# Patient Record
Sex: Male | Born: 1950 | Race: Black or African American | Hispanic: No | Marital: Single | Smoking: Current every day smoker
Health system: Southern US, Community
[De-identification: ages and names within clinical notes are randomized; demographics above are authoritative.]

## PROBLEM LIST (undated history)

## (undated) DIAGNOSIS — D696 Thrombocytopenia, unspecified: Secondary | ICD-10-CM

## (undated) DIAGNOSIS — I639 Cerebral infarction, unspecified: Secondary | ICD-10-CM

## (undated) DIAGNOSIS — I6521 Occlusion and stenosis of right carotid artery: Secondary | ICD-10-CM

## (undated) DIAGNOSIS — I10 Essential (primary) hypertension: Secondary | ICD-10-CM

## (undated) DIAGNOSIS — C61 Malignant neoplasm of prostate: Secondary | ICD-10-CM

## (undated) DIAGNOSIS — R51 Headache: Secondary | ICD-10-CM

## (undated) DIAGNOSIS — M810 Age-related osteoporosis without current pathological fracture: Secondary | ICD-10-CM

## (undated) DIAGNOSIS — H269 Unspecified cataract: Secondary | ICD-10-CM

## (undated) DIAGNOSIS — N179 Acute kidney failure, unspecified: Secondary | ICD-10-CM

## (undated) DIAGNOSIS — H409 Unspecified glaucoma: Secondary | ICD-10-CM

## (undated) DIAGNOSIS — Z87828 Personal history of other (healed) physical injury and trauma: Secondary | ICD-10-CM

## (undated) HISTORY — DX: Age-related osteoporosis without current pathological fracture: M81.0

## (undated) HISTORY — PX: OTHER SURGICAL HISTORY: SHX169

## (undated) HISTORY — DX: Unspecified cataract: H26.9

## (undated) HISTORY — DX: Malignant neoplasm of prostate: C61

## (undated) HISTORY — DX: Cerebral infarction, unspecified: I63.9

## (undated) HISTORY — DX: Unspecified glaucoma: H40.9

## (undated) HISTORY — DX: Personal history of other (healed) physical injury and trauma: Z87.828

## (undated) HISTORY — DX: Occlusion and stenosis of right carotid artery: I65.21

---

## 1983-03-14 DIAGNOSIS — Z87828 Personal history of other (healed) physical injury and trauma: Secondary | ICD-10-CM

## 1983-03-14 HISTORY — DX: Personal history of other (healed) physical injury and trauma: Z87.828

## 2004-10-31 ENCOUNTER — Emergency Department (HOSPITAL_COMMUNITY): Admission: EM | Admit: 2004-10-31 | Discharge: 2004-10-31 | Payer: Self-pay | Admitting: Emergency Medicine

## 2004-11-02 ENCOUNTER — Emergency Department (HOSPITAL_COMMUNITY): Admission: EM | Admit: 2004-11-02 | Discharge: 2004-11-02 | Payer: Self-pay | Admitting: Emergency Medicine

## 2004-11-05 ENCOUNTER — Emergency Department (HOSPITAL_COMMUNITY): Admission: EM | Admit: 2004-11-05 | Discharge: 2004-11-05 | Payer: Self-pay | Admitting: Family Medicine

## 2011-03-14 HISTORY — PX: CATARACT EXTRACTION W/PHACO: SHX586

## 2013-11-13 ENCOUNTER — Encounter: Payer: Self-pay | Admitting: *Deleted

## 2013-11-17 ENCOUNTER — Emergency Department (HOSPITAL_COMMUNITY): Payer: BC Managed Care – PPO

## 2013-11-17 ENCOUNTER — Observation Stay (HOSPITAL_COMMUNITY): Payer: BC Managed Care – PPO

## 2013-11-17 ENCOUNTER — Encounter (HOSPITAL_COMMUNITY): Payer: Self-pay | Admitting: Emergency Medicine

## 2013-11-17 ENCOUNTER — Inpatient Hospital Stay (HOSPITAL_COMMUNITY)
Admission: EM | Admit: 2013-11-17 | Discharge: 2013-11-19 | DRG: 065 | Disposition: A | Discharge status: Home / Self Care | Payer: BC Managed Care – PPO | Attending: Internal Medicine | Admitting: Internal Medicine

## 2013-11-17 DIAGNOSIS — L299 Pruritus, unspecified: Secondary | ICD-10-CM | POA: Diagnosis present

## 2013-11-17 DIAGNOSIS — G8190 Hemiplegia, unspecified affecting unspecified side: Secondary | ICD-10-CM | POA: Diagnosis present

## 2013-11-17 DIAGNOSIS — I639 Cerebral infarction, unspecified: Secondary | ICD-10-CM | POA: Diagnosis present

## 2013-11-17 DIAGNOSIS — H538 Other visual disturbances: Secondary | ICD-10-CM | POA: Diagnosis present

## 2013-11-17 DIAGNOSIS — G459 Transient cerebral ischemic attack, unspecified: Secondary | ICD-10-CM

## 2013-11-17 DIAGNOSIS — H5710 Ocular pain, unspecified eye: Secondary | ICD-10-CM

## 2013-11-17 DIAGNOSIS — F1490 Cocaine use, unspecified, uncomplicated: Secondary | ICD-10-CM | POA: Diagnosis present

## 2013-11-17 DIAGNOSIS — Z79899 Other long term (current) drug therapy: Secondary | ICD-10-CM

## 2013-11-17 DIAGNOSIS — F1410 Cocaine abuse, uncomplicated: Secondary | ICD-10-CM | POA: Diagnosis present

## 2013-11-17 DIAGNOSIS — R197 Diarrhea, unspecified: Secondary | ICD-10-CM | POA: Diagnosis not present

## 2013-11-17 DIAGNOSIS — I10 Essential (primary) hypertension: Secondary | ICD-10-CM | POA: Diagnosis present

## 2013-11-17 DIAGNOSIS — B369 Superficial mycosis, unspecified: Secondary | ICD-10-CM | POA: Diagnosis present

## 2013-11-17 DIAGNOSIS — N179 Acute kidney failure, unspecified: Secondary | ICD-10-CM | POA: Diagnosis present

## 2013-11-17 DIAGNOSIS — I6330 Cerebral infarction due to thrombosis of unspecified cerebral artery: Secondary | ICD-10-CM | POA: Diagnosis not present

## 2013-11-17 DIAGNOSIS — Z823 Family history of stroke: Secondary | ICD-10-CM

## 2013-11-17 DIAGNOSIS — I6350 Cerebral infarction due to unspecified occlusion or stenosis of unspecified cerebral artery: Secondary | ICD-10-CM | POA: Diagnosis not present

## 2013-11-17 DIAGNOSIS — N289 Disorder of kidney and ureter, unspecified: Secondary | ICD-10-CM | POA: Diagnosis present

## 2013-11-17 DIAGNOSIS — I6529 Occlusion and stenosis of unspecified carotid artery: Secondary | ICD-10-CM | POA: Diagnosis present

## 2013-11-17 DIAGNOSIS — M810 Age-related osteoporosis without current pathological fracture: Secondary | ICD-10-CM | POA: Diagnosis present

## 2013-11-17 DIAGNOSIS — F17200 Nicotine dependence, unspecified, uncomplicated: Secondary | ICD-10-CM | POA: Diagnosis present

## 2013-11-17 DIAGNOSIS — H409 Unspecified glaucoma: Secondary | ICD-10-CM | POA: Diagnosis present

## 2013-11-17 DIAGNOSIS — L989 Disorder of the skin and subcutaneous tissue, unspecified: Secondary | ICD-10-CM

## 2013-11-17 DIAGNOSIS — N390 Urinary tract infection, site not specified: Secondary | ICD-10-CM | POA: Diagnosis present

## 2013-11-17 DIAGNOSIS — I63131 Cerebral infarction due to embolism of right carotid artery: Secondary | ICD-10-CM

## 2013-11-17 HISTORY — DX: Headache: R51

## 2013-11-17 LAB — COMPREHENSIVE METABOLIC PANEL
ALK PHOS: 61 U/L (ref 39–117)
ALT: 13 U/L (ref 0–53)
AST: 21 U/L (ref 0–37)
Albumin: 3.7 g/dL (ref 3.5–5.2)
Anion gap: 11 (ref 5–15)
BUN: 22 mg/dL (ref 6–23)
CHLORIDE: 99 meq/L (ref 96–112)
CO2: 25 meq/L (ref 19–32)
Calcium: 8.6 mg/dL (ref 8.4–10.5)
Creatinine, Ser: 1.61 mg/dL — ABNORMAL HIGH (ref 0.50–1.35)
GFR, EST AFRICAN AMERICAN: 51 mL/min — AB (ref >90)
GFR, EST NON AFRICAN AMERICAN: 44 mL/min — AB (ref >90)
GLUCOSE: 117 mg/dL — AB (ref 70–99)
POTASSIUM: 4 meq/L (ref 3.7–5.3)
Sodium: 135 mEq/L — ABNORMAL LOW (ref 137–147)
Total Bilirubin: 0.8 mg/dL (ref 0.3–1.2)
Total Protein: 6.8 g/dL (ref 6.0–8.3)

## 2013-11-17 LAB — CBC
HEMATOCRIT: 39.8 % (ref 39.0–52.0)
HEMOGLOBIN: 13.4 g/dL (ref 13.0–17.0)
MCH: 31.8 pg (ref 26.0–34.0)
MCHC: 33.7 g/dL (ref 30.0–36.0)
MCV: 94.5 fL (ref 78.0–100.0)
Platelets: 127 10*3/uL — ABNORMAL LOW (ref 150–400)
RBC: 4.21 MIL/uL — AB (ref 4.22–5.81)
RDW: 12.7 % (ref 11.5–15.5)
WBC: 7.8 10*3/uL (ref 4.0–10.5)

## 2013-11-17 LAB — URINALYSIS, ROUTINE W REFLEX MICROSCOPIC
BILIRUBIN URINE: NEGATIVE
Glucose, UA: NEGATIVE mg/dL
Ketones, ur: NEGATIVE mg/dL
Leukocytes, UA: NEGATIVE
Nitrite: NEGATIVE
Protein, ur: 100 mg/dL — AB
Specific Gravity, Urine: 1.026 (ref 1.005–1.030)
Urobilinogen, UA: 0.2 mg/dL (ref 0.0–1.0)
pH: 5.5 (ref 5.0–8.0)

## 2013-11-17 LAB — URINE MICROSCOPIC-ADD ON

## 2013-11-17 LAB — RAPID URINE DRUG SCREEN, HOSP PERFORMED
Amphetamines: NOT DETECTED
Barbiturates: NOT DETECTED
Benzodiazepines: NOT DETECTED
COCAINE: POSITIVE — AB
Opiates: NOT DETECTED
TETRAHYDROCANNABINOL: NOT DETECTED

## 2013-11-17 LAB — DIFFERENTIAL
Basophils Absolute: 0 10*3/uL (ref 0.0–0.1)
Basophils Relative: 0 % (ref 0–1)
Eosinophils Absolute: 0 10*3/uL (ref 0.0–0.7)
Eosinophils Relative: 0 % (ref 0–5)
LYMPHS ABS: 0.6 10*3/uL — AB (ref 0.7–4.0)
Lymphocytes Relative: 8 % — ABNORMAL LOW (ref 12–46)
MONO ABS: 0.7 10*3/uL (ref 0.1–1.0)
MONOS PCT: 9 % (ref 3–12)
NEUTROS ABS: 6.5 10*3/uL (ref 1.7–7.7)
Neutrophils Relative %: 83 % — ABNORMAL HIGH (ref 43–77)

## 2013-11-17 LAB — MRSA PCR SCREENING: MRSA by PCR: NEGATIVE

## 2013-11-17 LAB — I-STAT TROPONIN, ED: Troponin i, poc: 0.01 ng/mL (ref 0.00–0.08)

## 2013-11-17 LAB — PROTIME-INR
INR: 1.17 (ref 0.00–1.49)
Prothrombin Time: 14.9 seconds (ref 11.6–15.2)

## 2013-11-17 LAB — TROPONIN I

## 2013-11-17 LAB — APTT: aPTT: 30 seconds (ref 24–37)

## 2013-11-17 LAB — ETHANOL: Alcohol, Ethyl (B): <11 mg/dL (ref 0–11)

## 2013-11-17 MED ORDER — HYPROMELLOSE (GONIOSCOPIC) 2.5 % OP SOLN: 1.0000 [drp] | Freq: Three times a day (TID) | OPHTHALMIC | Status: DC | PRN

## 2013-11-17 MED ORDER — ASPIRIN 81 MG PO CHEW
324.0000 mg | CHEWABLE_TABLET | Freq: Once | ORAL | Status: AC
  Administered 2013-11-17: 324 mg via ORAL
  Filled 2013-11-17: qty 4

## 2013-11-17 MED ORDER — STROKE: EARLY STAGES OF RECOVERY BOOK
Freq: Once | Status: AC
  Administered 2013-11-17: 18:00:00
  Filled 2013-11-17: qty 1

## 2013-11-17 MED ORDER — TERBINAFINE HCL 1 % EX CREA
TOPICAL_CREAM | Freq: Two times a day (BID) | CUTANEOUS | Status: DC
  Administered 2013-11-17: 22:00:00 via TOPICAL
  Administered 2013-11-18 (×2): 1 via TOPICAL
  Administered 2013-11-19: 12:00:00 via TOPICAL
  Filled 2013-11-17: qty 12

## 2013-11-17 MED ORDER — SODIUM CHLORIDE 0.9 % IV SOLN
INTRAVENOUS | Status: DC
  Administered 2013-11-17: 17:00:00 via INTRAVENOUS

## 2013-11-17 MED ORDER — HEPARIN SODIUM (PORCINE) 5000 UNIT/ML IJ SOLN
5000.0000 [IU] | Freq: Three times a day (TID) | INTRAMUSCULAR | Status: DC
  Administered 2013-11-17 – 2013-11-19 (×6): 5000 [IU] via SUBCUTANEOUS
  Filled 2013-11-17 (×6): qty 1

## 2013-11-17 MED ORDER — ASPIRIN 81 MG PO CHEW
324.0000 mg | CHEWABLE_TABLET | Freq: Every day | ORAL | Status: DC
  Administered 2013-11-18 – 2013-11-19 (×2): 324 mg via ORAL
  Filled 2013-11-17 (×3): qty 4

## 2013-11-17 MED ORDER — SENNOSIDES-DOCUSATE SODIUM 8.6-50 MG PO TABS
1.0000 | ORAL_TABLET | Freq: Every evening | ORAL | Status: DC | PRN
  Filled 2013-11-17: qty 1

## 2013-11-17 MED ORDER — POLYVINYL ALCOHOL 1.4 % OP SOLN
1.0000 [drp] | Freq: Three times a day (TID) | OPHTHALMIC | Status: DC | PRN
  Filled 2013-11-17: qty 15

## 2013-11-17 MED ORDER — ACETAMINOPHEN 325 MG PO TABS
650.0000 mg | ORAL_TABLET | Freq: Four times a day (QID) | ORAL | Status: DC | PRN
  Administered 2013-11-17: 650 mg via ORAL
  Filled 2013-11-17: qty 2

## 2013-11-17 NOTE — Progress Notes (Signed)
Pt arrived to the unit, welcomed and oriented.  Assessment complete, VS taken.  Telemetry monitor applied.  Will continue to monitor. Cori Razor, RN 11/17/2013

## 2013-11-17 NOTE — ED Notes (Signed)
Pt initial sx started at 0930 and resolved around 1000

## 2013-11-17 NOTE — Discharge Summary (Signed)
Name: Oscar Greene MRN: [id number] DOB: 1950/07/22 63 y.o. PCP: Oscar Sheldon, PA-C  Date of Admission: 11/17/2013 10:26 AM Date of Discharge: 11/17/2013 Attending Physician: Oscar Contes, MD  Discharge Diagnosis: Active Problems:   Cerebral thrombosis with cerebral infarction   Renal insufficiency   Cocaine use   Hypertension   Glaucoma   Fungal skin infection  Discharge Medications:   Medication List         aspirin EC 81 MG tablet  Take 1 tablet (81 mg total) by mouth daily.     clopidogrel 75 MG tablet  Commonly known as:  PLAVIX  Take 1 tablet (75 mg total) by mouth daily.     hydroxypropyl methylcellulose / hypromellose 2.5 % ophthalmic solution  Commonly known as:  ISOPTO TEARS / GONIOVISC  Place 1 drop into both eyes 3 (three) times daily as needed for dry eyes.     naproxen sodium 220 MG tablet  Commonly known as:  ANAPROX  Take 440 mg by mouth 2 (two) times daily as needed (for pain).     nystatin cream  Commonly known as:  MYCOSTATIN  Apply 1 application topically 2 (two) times daily.        Disposition and follow-up:   Oscar Greene was discharged from [facility] in Stable condition.  At the hospital follow up visit please address:  1.  Oscar Greene was found to have multiple foci of punctate acute infarction in the right frontal lobe along with right ICA occlusion. His risk factors for stroke include smoking, family history, poor diet and cocaine abuse. We have started to counsel him on changing the modifiable risk factors. Please continue this teaching/counseling.  He was also started on daily aspirin 81mg  and plavix 75mg ; neurology recommended this regimen given his finding of total occlusion of right ICA (he was not a candidate for carotid endarterectomy or stenting).  Finally, the patient had diarrhea that started before his CVA and lasted throughout his hospitalization; his c. Diff was negative, but stool pathogen is  pending. We will call to give him the results.  2.  Labs / imaging needed at time of follow-up: none  3.  Pending labs/ test needing follow-up: stool pathogen  Follow-up Appointments: Oscar Greene at the [facility] [address] [place], [place], [place] [address], [phone] on November 5th, 8:45  Discharge Instructions: Please start taking one aspirin (81 mg) and one plavix (75 mg) every day for stroke prevention. Follow the guidelines we discussed to improve your diet, stop smoking and stop using cocaine. All of these things will help to reduce the risk of another, more debilitating stroke.  You may use the cream on your leg rash twice a day.   Stimulant Use Disorder-Cocaine Cocaine is one of a group of powerful drugs called stimulants. Cocaine has medical uses for stopping nosebleeds and for pain control before minor nose or dental surgery. However, cocaine is misused because of the effects that it produces. These effects include:   A feeling of extreme pleasure.  Alertness.  High energy. Common street names for cocaine include coke, crack, blow, snow, and nose candy. Cocaine is snorted, dissolved in water and injected, or smoked.  Stimulants are addictive because they activate regions of the brain that produce both the pleasurable sensation of "reward" and psychological dependence. Together, these actions account for loss of control and the rapid development of drug dependence. This means you become ill without the drug (withdrawal)  and need to keep using it to function.  Stimulant use disorder is use of stimulants that disrupts your daily life. It disrupts relationships with family and friends and how you do your job. Cocaine increases your blood pressure and heart rate. It can cause a heart attack or stroke. Cocaine can also cause death from irregular heart rate or seizures. SYMPTOMS Symptoms of stimulant use disorder with cocaine include:  Use of cocaine in larger amounts  or over a longer period of time than intended.  Unsuccessful attempts to cut down or control cocaine use.  A lot of time spent obtaining, using, or recovering from the effects of cocaine.  A strong desire or urge to use cocaine (craving).  Continued use of cocaine in spite of major problems at work, school, or home because of use.  Continued use of cocaine in spite of relationship problems because of use.  Giving up or cutting down on important life activities because of cocaine use.  Use of cocaine over and over in situations when it is physically hazardous, such as driving a car.  Continued use of cocaine in spite of a physical problem that is likely related to use. Physical problems can include:  Malnutrition.  Nosebleeds.  Chest pain.  High blood pressure.  A hole that develops between the part of your nose that separates your nostrils (perforated nasal septum).  Lung and kidney damage.  Continued use of cocaine in spite of a mental problem that is likely related to use. Mental problems can include:  Schizophrenia-like symptoms.  Depression.  Bipolar mood swings.  Anxiety.  Sleep problems.  Need to use more and more cocaine to get the same effect, or lessened effect over time with use of the same amount of cocaine (tolerance).  Having withdrawal symptoms when cocaine use is stopped, or using cocaine to reduce or avoid withdrawal symptoms. Withdrawal symptoms include:  Depressed or irritable mood.  Low energy or restlessness.  Bad dreams.  Poor or excessive sleep.  Increased appetite. DIAGNOSIS Stimulant use disorder is diagnosed by your health care provider. You may be asked questions about your cocaine use and how it affects your life. A physical exam may be done. A drug screen may be ordered. You may be referred to a mental health professional. The diagnosis of stimulant use disorder requires at least two symptoms within 12 months. The type of stimulant  use disorder depends on the number of signs and symptoms you have. The type may be:  Mild. Two or three signs and symptoms.  Moderate. Four or five signs and symptoms.  Severe. Six or more signs and symptoms. TREATMENT Treatment for stimulant use disorder is usually provided by mental health professionals with training in substance use disorders. The following options are available:  Counseling or talk therapy. Talk therapy addresses the reasons you use cocaine and ways to keep you from using again. Goals of talk therapy include:  Identifying and avoiding triggers for use.  Handling cravings.  Replacing use with healthy activities.  Support groups. Support groups provide emotional support, advice, and guidance.  Medicine. Certain medicines may decrease cocaine cravings or withdrawal symptoms. HOME CARE INSTRUCTIONS  Take medicines only as directed by your health care provider.  Identify the people and activities that trigger your cocaine use and avoid them.  Keep all follow-up visits as directed by your health care provider. SEEK MEDICAL CARE IF:  Your symptoms get worse or you relapse.  You are not able to take medicines as  directed. SEEK IMMEDIATE MEDICAL CARE IF:  You have serious thoughts about hurting yourself or others.  You have a seizure, chest pain, sudden weakness, or loss of speech or vision. [place] on Drug Abuse: motorcyclefax.com  Substance Abuse and Mental Health Services Administration: ktimeonline.com Document Released: 02/25/2000 Document Revised: 07/14/2013 Document Reviewed: 03/12/2013 [facility] Patient Information 2015 [place], [place]. This information is not intended to replace advice given to you by your health care provider. Make sure you discuss any questions you have with your health care provider. Stroke Prevention Some medical conditions and behaviors are associated with an increased chance of having a stroke.  You may prevent a stroke by making healthy choices and managing medical conditions. HOW CAN I REDUCE MY RISK OF HAVING A STROKE?   Stay physically active. Get at least 30 minutes of activity on most or all days.  Do not smoke. It may also be helpful to avoid exposure to secondhand smoke.  Limit alcohol use. Moderate alcohol use is considered to be:  No more than 2 drinks per day for men.  No more than 1 drink per day for nonpregnant women.  Eat healthy foods. This involves:  Eating 5 or more servings of fruits and vegetables a day.  Making dietary changes that address high blood pressure (hypertension), high cholesterol, diabetes, or obesity.  Manage your cholesterol levels.  Making food choices that are high in fiber and low in saturated fat, trans fat, and cholesterol may control cholesterol levels.  Take any prescribed medicines to control cholesterol as directed by your health care provider.  Manage your diabetes.  Controlling your carbohydrate and sugar intake is recommended to manage diabetes.  Take any prescribed medicines to control diabetes as directed by your health care provider.  Control your hypertension.  Making food choices that are low in salt (sodium), saturated fat, trans fat, and cholesterol is recommended to manage hypertension.  Take any prescribed medicines to control hypertension as directed by your health care provider.  Maintain a healthy weight.  Reducing calorie intake and making food choices that are low in sodium, saturated fat, trans fat, and cholesterol are recommended to manage weight.  Stop drug abuse.  Avoid taking birth control pills.  Talk to your health care provider about the risks of taking birth control pills if you are over 8 years old, smoke, get migraines, or have ever had a blood clot.  Get evaluated for sleep disorders (sleep apnea).  Talk to your health care provider about getting a sleep evaluation if you snore a lot or  have excessive sleepiness.  Take medicines only as directed by your health care provider.  For some people, aspirin or blood thinners (anticoagulants) are helpful in reducing the risk of forming abnormal blood clots that can lead to stroke. If you have the irregular heart rhythm of atrial fibrillation, you should be on a blood thinner unless there is a good reason you cannot take them.  Understand all your medicine instructions.  Make sure that other conditions (such as anemia or atherosclerosis) are addressed. SEEK IMMEDIATE MEDICAL CARE IF:   You have sudden weakness or numbness of the face, arm, or leg, especially on one side of the body.  Your face or eyelid droops to one side.  You have sudden confusion.  You have trouble speaking (aphasia) or understanding.  You have sudden trouble seeing in one or both eyes.  You have sudden trouble walking.  You have dizziness.  You have  a loss of balance or coordination.  You have a sudden, severe headache with no known cause.  You have new chest pain or an irregular heartbeat. Any of these symptoms may represent a serious problem that is an emergency. Do not wait to see if the symptoms will go away. Get medical help at once. Call your local emergency services (911 in [place] Do not drive yourself to the hospital. Document Released: 04/06/2004 Document Revised: 07/14/2013 Document Reviewed: 08/30/2012 [facility] Patient Information 2015 [place], [place]. This information is not intended to replace advice given to you by your health care provider. Make sure you discuss any questions you have with your health care provider. Smoking Cessation Quitting smoking is important to your health and has many advantages. However, it is not always easy to quit since nicotine is a very addictive drug. Oftentimes, people try 3 times or more before being able to quit. This document explains the best ways for you to prepare to quit smoking. Quitting takes hard  work and a lot of effort, but you can do it. ADVANTAGES OF QUITTING SMOKING  You will live longer, feel better, and live better.  Your body will feel the impact of quitting smoking almost immediately.  Within 20 minutes, blood pressure decreases. Your pulse returns to its normal level.  After 8 hours, carbon monoxide levels in the blood return to normal. Your oxygen level increases.  After 24 hours, the chance of having a heart attack starts to decrease. Your breath, hair, and body stop smelling like smoke.  After 48 hours, damaged nerve endings begin to recover. Your sense of taste and smell improve.  After 72 hours, the body is virtually free of nicotine. Your bronchial tubes relax and breathing becomes easier.  After 2 to 12 weeks, lungs can hold more air. Exercise becomes easier and circulation improves.  The risk of having a heart attack, stroke, cancer, or lung disease is greatly reduced.  After 1 year, the risk of coronary heart disease is cut in half.  After 5 years, the risk of stroke falls to the same as a nonsmoker.  After 10 years, the risk of lung cancer is cut in half and the risk of other cancers decreases significantly.  After 15 years, the risk of coronary heart disease drops, usually to the level of a nonsmoker.  If you are pregnant, quitting smoking will improve your chances of having a healthy baby.  The people you live with, especially any children, will be healthier.  You will have extra money to spend on things other than cigarettes. QUESTIONS TO THINK ABOUT BEFORE ATTEMPTING TO QUIT You may want to talk about your answers with your health care provider.  Why do you want to quit?  If you tried to quit in the past, what helped and what did not?  What will be the most difficult situations for you after you quit? How will you plan to handle them?  Who can help you through the tough times? Your family? Friends? A health care provider?  What pleasures do  you get from smoking? What ways can you still get pleasure if you quit? Here are some questions to ask your health care provider:  How can you help me to be successful at quitting?  What medicine do you think would be best for me and how should I take it?  What should I do if I need more help?  What is smoking withdrawal like? How can I get information on withdrawal?  GET READY  Set a quit date.  Change your environment by getting rid of all cigarettes, ashtrays, matches, and lighters in your home, car, or work. Do not let people smoke in your home.  Review your past attempts to quit. Think about what worked and what did not. GET SUPPORT AND ENCOURAGEMENT You have a better chance of being successful if you have help. You can get support in many ways.  Tell your family, friends, and coworkers that you are going to quit and need their support. Ask them not to smoke around you.  Get individual, group, or telephone counseling and support. Programs are available at [employer] and health centers. Call your local health department for information about programs in your area.  Spiritual beliefs and practices may help some smokers quit.  Download a "quit meter" on your computer to keep track of quit statistics, such as how long you have gone without smoking, cigarettes not smoked, and money saved.  Get a self-help book about quitting smoking and staying off tobacco. [place] yourself from urges to smoke. Talk to someone, go for a walk, or occupy your time with a task.  Change your normal routine. Take a different route to work. Drink tea instead of coffee. Eat breakfast in a different place.  Reduce your stress. Take a hot bath, exercise, or read a book.  Plan something enjoyable to do every day. Reward yourself for not smoking.  Explore interactive web-based programs that specialize in helping you quit. GET MEDICINE AND USE IT CORRECTLY Medicines  can help you stop smoking and decrease the urge to smoke. Combining medicine with the above behavioral methods and support can greatly increase your chances of successfully quitting smoking.  Nicotine replacement therapy helps deliver nicotine to your body without the negative effects and risks of smoking. Nicotine replacement therapy includes nicotine gum, lozenges, inhalers, nasal sprays, and skin patches. Some may be available over-the-counter and others require a prescription.  Antidepressant medicine helps people abstain from smoking, but how this works is unknown. This medicine is available by prescription.  Nicotinic receptor partial agonist medicine simulates the effect of nicotine in your brain. This medicine is available by prescription. Ask your health care provider for advice about which medicines to use and how to use them based on your health history. Your health care provider will tell you what side effects to look out for if you choose to be on a medicine or therapy. Carefully read the information on the package. Do not use any other product containing nicotine while using a nicotine replacement product.  RELAPSE OR DIFFICULT SITUATIONS Most relapses occur within the first 3 months after quitting. Do not be discouraged if you start smoking again. Remember, most people try several times before finally quitting. You may have symptoms of withdrawal because your body is used to nicotine. You may crave cigarettes, be irritable, feel very hungry, cough often, get headaches, or have difficulty concentrating. The withdrawal symptoms are only temporary. They are strongest when you first quit, but they will go away within 10-14 days. To reduce the chances of relapse, try to:  Avoid drinking alcohol. Drinking lowers your chances of successfully quitting.  Reduce the amount of caffeine you consume. Once you quit smoking, the amount of caffeine in your body increases and can give you symptoms, such  as a rapid heartbeat, sweating, and anxiety.  Avoid smokers because they can make you want to smoke.  Do not let  weight gain distract you. Many smokers will gain weight when they quit, usually less than 10 pounds. Eat a healthy diet and stay active. You can always lose the weight gained after you quit.  Find ways to improve your mood other than smoking. FOR MORE INFORMATION  www.smokefree.gov  Document Released: 02/21/2001 Document Revised: 07/14/2013 Document Reviewed: 06/08/2011 [facility] Patient Information 2015 [place], [place]. This information is not intended to replace advice given to you by your health care provider. Make sure you discuss any questions you have with your health care provider.   Consultations: Treatment Team:  Md Stroke, MD  Procedures Performed:  Dg Chest 2 View  11/17/2013   CLINICAL DATA:  Weakness. Left-sided paralysis which subsequently resolved. Illness for several days.  EXAM: CHEST  2 VIEW  COMPARISON:  None.  FINDINGS: Cardiothoracic index 52% compatible with mild enlargement. No edema. Mildly tortuous thoracic aorta.  The lungs appear clear.  IMPRESSION: 1. Mildly enlarged cardiopericardial silhouette. Otherwise, no significant abnormalities are observed.   Electronically Signed   By: Sherryl Barters M.D.   On: 11/17/2013 12:24   Ct Head Wo Contrast  11/17/2013   CLINICAL DATA:  Felt weak while working outside. Episode of left-sided paralysis. Symptoms have resolved. 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  EXAM: CT HEAD WITHOUT CONTRAST  TECHNIQUE: Contiguous axial images were obtained from the base of the skull through the vertex without intravenous contrast.  COMPARISON:  None.  FINDINGS: Remote left frontal craniotomy. Underlying encephalomalacia. Findings consistent with patient's remote injury.  No intracranial hemorrhage.  Right caudate head and mid to anterior right centrum semiovale hypodensity have an appearance more suggestive of remote  infarction rather than acute infarct.  No CT evidence of large acute infarct.  Calcification anterior right frontal lobe may reflect result prior trauma or infection. No surrounding mass identified on this unenhanced exam.  No hydrocephalus.  IMPRESSION: Remote left frontal craniotomy. Underlying encephalomalacia. Findings consistent with patient's remote injury.  No intracranial hemorrhage.  Right caudate head and mid to anterior right centrum semiovale hypodensity have an appearance more suggestive of remote infarction rather than acute infarct.  No CT evidence of large acute infarct.  Calcification anterior right frontal lobe may reflect result prior trauma or infection.   Electronically Signed   By: Chauncey Cruel M.D.   On: 11/17/2013 11:56   Mr Brain Wo Contrast  11/17/2013   CLINICAL DATA:  Transient episode of left-sided weakness. Evaluate for stroke.  EXAM: MRI HEAD WITHOUT CONTRAST  MRA HEAD WITHOUT CONTRAST  TECHNIQUE: Multiplanar, multiecho pulse sequences of the brain and surrounding structures were obtained without intravenous contrast. Angiographic images of the head were obtained using MRA technique without contrast.  COMPARISON:  Head CT 11/17/2013  FINDINGS: MRI HEAD FINDINGS  Multiple punctate foci of restricted diffusion are present in the right frontal lobe involving cortex and subcortical white matter in the MCA territory. There is no evidence of intracranial hemorrhage, mass, midline shift, or extra-axial fluid collection. A small calcification is again seen in the right frontal lobe. Sequelae of prior left frontal craniotomy are again identified with underlying left frontal lobe encephalomalacia. Scattered foci of T2 hyperintensity in the subcortical and deep cerebral white matter are nonspecific but compatible with mild chronic small vessel ischemic disease. There is mild generalized cerebral atrophy. An old lacunar infarct is noted in the right caudate.  Orbits are unremarkable. Mild right  maxillary sinus mucosal thickening is noted. Mastoid air cells are clear. Abnormal  appearance of the intracranial right ICA flow void is consistent with proximal occlusion in the neck. Mild upper cervical spondylosis is partially visualized with marrow changes at C4-5 favored to be degenerative.  MRA HEAD FINDINGS  The visualized distal vertebral arteries are patent and codominant. PICA origins are patent. AICA and SCA origins are patent. Basilar artery is patent without stenosis. PCAs are unremarkable. Posterior communicating arteries are not clearly identified.  Lack of flow related enhancement in the imaged, distal cervical right ICA and majority of the intracranial right ICA is consistent with proximal occlusion in the neck. Minimal flow related enhancement in the right supraclinoid ICA may reflect ECA collateral flow via the ophthalmic artery. The right M1 segment is patent without focal stenosis, however there is diminished flow within the M1 segment as well as in MCA branch vessels compared to the left. The number of right MCA distal branch vessels also appears diminished compared to the left. There is minimal flow related enhancement in the right A1 segment, with the right A2 segment presumably receiving the majority of its supply via the anterior communicating artery, although this is not well seen.  Left internal carotid artery is patent from skullbase to carotid terminus without stenosis. Left MCA and left ACA are unremarkable. No intracranial aneurysm is identified.  IMPRESSION: 1. Multiple foci of punctate acute infarction in the right frontal lobe. 2. 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 3. Left frontal lobe encephalomalacia at site of prior craniotomy. 4. 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 Distal reconstitution of the carotid terminus, likely via external carotid flow. Proximal right MCA is patent, although with diminished  flow.   Electronically Signed   By: Logan Bores   On: 11/17/2013 17:04   Mr Jodene Nam Head/brain Wo Cm  11/17/2013   CLINICAL DATA:  Transient episode of left-sided weakness. Evaluate for stroke.  EXAM: MRI HEAD WITHOUT CONTRAST  MRA HEAD WITHOUT CONTRAST  TECHNIQUE: Multiplanar, multiecho pulse sequences of the brain and surrounding structures were obtained without intravenous contrast. Angiographic images of the head were obtained using MRA technique without contrast.  COMPARISON:  Head CT 11/17/2013  FINDINGS: MRI HEAD FINDINGS  Multiple punctate foci of restricted diffusion are present in the right frontal lobe involving cortex and subcortical white matter in the MCA territory. There is no evidence of intracranial hemorrhage, mass, midline shift, or extra-axial fluid collection. A small calcification is again seen in the right frontal lobe. Sequelae of prior left frontal craniotomy are again identified with underlying left frontal lobe encephalomalacia. Scattered foci of T2 hyperintensity in the subcortical and deep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

## 2013-11-17 NOTE — Consult Note (Signed)
Stroke Consult Note     Chief Complaint: left sided weakness and dizziness HPI: Oscar Greene is an 63 y.o. male who was at work in a tobacco field and this morning when at 9 AM he noticed sudden onset of dizziness as well as left-sided weakness. He had trouble moving his left hand and had decreased sensation. His symptoms lasted only about 30 minutes and started recovering at the time he came to the hospital he had completely resolved. He denied any complaint headache, slurred speech, double vision. He has no known prior history of strokes or TIAs. He has not seen a physician for years. He has had glaucoma diagnosed in recent years and has some arthritis for which he takes Motrin twice daily. He has a remote history of a left frontal craniotomy for a blood clot evacuation with no residual neurological deficits or history of seizures  LSN: 9 AM on 11/17/13 tPA Given: No: Complete resolution of deficits  Past Medical History  Diagnosis Date  . Cataract   . Glaucoma   . Osteoporosis     History reviewed. No pertinent past surgical history.  History reviewed. No pertinent family history. Social History:  reports that he has been smoking.  He has never used smokeless tobacco. He reports that he does not use illicit drugs. His alcohol history is not on file.  Allergies: No Known Allergies   (Not in a hospital admission)  ROS: Dizziness, numbness, weakness and joint pain. All other systems negative  Physical Examination: Blood pressure 110/83, pulse 81, temperature 98.3 F (36.8 C), temperature source Oral, resp. rate 19, SpO2 100.00%.  Awake alert. Afebrile. Head is nontraumatic. Neck is supple without bruit. Hearing is normal. Cardiac exam no murmur or gallop. Lungs are clear to auscultation. Distal pulses are well felt. Left temporal skull defect and prior craniotomy site Neurologic Examination:   ;  Awake  Alert oriented x 3. Normal speech and language.eye movements full without  nystagmus.fundi were not visualized. Vision acuity and fields appear normal. Hearing is normal. Palatal movements are normal. Face symmetric. Tongue midline. Normal strength, tone, reflexes and coordination. Normal sensation. Gait deferred. NIHSS 1 missed his age MRS 0 ABCD2 score 4  Results for orders placed during the hospital encounter of 11/17/13 (from the past 48 hour(s))  ETHANOL     Status: None   Collection Time    11/17/13 12:08 PM      Result Value Ref Range   Alcohol, Ethyl (B) <11  0 - 11 mg/dL   Comment:            LOWEST DETECTABLE LIMIT FOR     SERUM ALCOHOL IS 11 mg/dL     FOR MEDICAL PURPOSES ONLY  PROTIME-INR     Status: None   Collection Time    11/17/13 12:08 PM      Result Value Ref Range   Prothrombin Time 14.9  11.6 - 15.2 seconds   INR 1.17  0.00 - 1.49  APTT     Status: None   Collection Time    11/17/13 12:08 PM      Result Value Ref Range   aPTT 30  24 - 37 seconds  CBC     Status: Abnormal   Collection Time    11/17/13 12:08 PM      Result Value Ref Range   WBC 7.8  4.0 - 10.5 K/uL   RBC 4.21 (*) 4.22 - 5.81 MIL/uL   Hemoglobin 13.4  13.0 -  17.0 g/dL   HCT 39.8  39.0 - 52.0 %   MCV 94.5  78.0 - 100.0 fL   MCH 31.8  26.0 - 34.0 pg   MCHC 33.7  30.0 - 36.0 g/dL   RDW 12.7  11.5 - 15.5 %   Platelets 127 (*) 150 - 400 K/uL  DIFFERENTIAL     Status: Abnormal   Collection Time    11/17/13 12:08 PM      Result Value Ref Range   Neutrophils Relative % 83 (*) 43 - 77 %   Neutro Abs 6.5  1.7 - 7.7 K/uL   Lymphocytes Relative 8 (*) 12 - 46 %   Lymphs Abs 0.6 (*) 0.7 - 4.0 K/uL   Monocytes Relative 9  3 - 12 %   Monocytes Absolute 0.7  0.1 - 1.0 K/uL   Eosinophils Relative 0  0 - 5 %   Eosinophils Absolute 0.0  0.0 - 0.7 K/uL   Basophils Relative 0  0 - 1 %   Basophils Absolute 0.0  0.0 - 0.1 K/uL  COMPREHENSIVE METABOLIC PANEL     Status: Abnormal   Collection Time    11/17/13 12:08 PM      Result Value Ref Range   Sodium 135 (*) 137 - 147  mEq/L   Potassium 4.0  3.7 - 5.3 mEq/L   Chloride 99  96 - 112 mEq/L   CO2 25  19 - 32 mEq/L   Glucose, Bld 117 (*) 70 - 99 mg/dL   BUN 22  6 - 23 mg/dL   Creatinine, Ser 1.61 (*) 0.50 - 1.35 mg/dL   Calcium 8.6  8.4 - 10.5 mg/dL   Total Protein 6.8  6.0 - 8.3 g/dL   Albumin 3.7  3.5 - 5.2 g/dL   AST 21  0 - 37 U/L   ALT 13  0 - 53 U/L   Alkaline Phosphatase 61  39 - 117 U/L   Total Bilirubin 0.8  0.3 - 1.2 mg/dL   GFR calc non Af Amer 44 (*) >90 mL/min   GFR calc Af Amer 51 (*) >90 mL/min   Comment: (NOTE)     The eGFR has been calculated using the CKD EPI equation.     This calculation has not been validated in all clinical situations.     eGFR's persistently <90 mL/min signify possible Chronic Kidney     Disease.   Anion gap 11  5 - 15  I-STAT TROPOININ, ED     Status: None   Collection Time    11/17/13 12:15 PM      Result Value Ref Range   Troponin i, poc 0.01  0.00 - 0.08 ng/mL   Comment 3            Comment: Due to the release kinetics of cTnI,     a negative result within the first hours     of the onset of symptoms does not rule out     myocardial infarction with certainty.     If myocardial infarction is still suspected,     repeat the test at appropriate intervals.   Dg Chest 2 View  11/17/2013   CLINICAL DATA:  Weakness. Left-sided paralysis which subsequently resolved. Illness for several days.  EXAM: CHEST  2 VIEW  COMPARISON:  None.  FINDINGS: Cardiothoracic index 52% compatible with mild enlargement. No edema. Mildly tortuous thoracic aorta.  The lungs appear clear.  IMPRESSION: 1. Mildly enlarged cardiopericardial silhouette. Otherwise,  no significant abnormalities are observed.   Electronically Signed   By: Sherryl Barters M.D.   On: 11/17/2013 12:24   Ct Head Wo Contrast  11/17/2013   CLINICAL DATA:  Felt weak while working outside. Episode of left-sided paralysis. Symptoms have resolved. 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  EXAM: CT HEAD  WITHOUT CONTRAST  TECHNIQUE: Contiguous axial images were obtained from the base of the skull through the vertex without intravenous contrast.  COMPARISON:  None.  FINDINGS: Remote left frontal craniotomy. Underlying encephalomalacia. Findings consistent with patient's remote injury.  No intracranial hemorrhage.  Right caudate head and mid to anterior right centrum semiovale hypodensity have an appearance more suggestive of remote infarction rather than acute infarct.  No CT evidence of large acute infarct.  Calcification anterior right frontal lobe may reflect result prior trauma or infection. No surrounding mass identified on this unenhanced exam.  No hydrocephalus.  IMPRESSION: Remote left frontal craniotomy. Underlying encephalomalacia. Findings consistent with patient's remote injury.  No intracranial hemorrhage.  Right caudate head and mid to anterior right centrum semiovale hypodensity have an appearance more suggestive of remote infarction rather than acute infarct.  No CT evidence of large acute infarct.  Calcification anterior right frontal lobe may reflect result prior trauma or infection.   Electronically Signed   By: Chauncey Cruel M.D.   On: 11/17/2013 11:56    Assessment: 63 y.o. male with transient left-sided weakness secondary to right brain TIA etiology undetermined at present but likely due to small vessel disease. Stroke Risk Factors - hypertension and smoking  Plan: 1. HgbA1c, fasting lipid panel 2. MRI, MRA  of the brain without contrast 3. PT consult, OT consult, Speech consult 4. Echocardiogram 5. Carotid dopplers 6. Prophylactic therapy-Antiplatelet med: Aspirin - dose 300 mg versus Ticagrelor in [facility] Trial 7. Risk factor modification. Patient counseled to quit smoking and be compliant with his antiplatelet therapy and blood pressure medicines 8. Telemetry monitoring 9. Frequent neuro checks I had a long discussion with the patient, sister, brother and other family members  regarding his risk for recurrent strokes, need for further evaluation and aggressive risk factor modification to prevent future strokes and TIAs. Patient is at significant risk for worsening of deficits and recurrent strokes. He will be admitted for further stroke risk stratification workup. He may consider possible participation in the [employer] trial if interested.    Antony Contras, MD  11/17/2013, 1:40 PM

## 2013-11-17 NOTE — ED Provider Notes (Signed)
Patient seen/examined in the Emergency Department in conjunction with Midlevel Provider [facility] Patient reports left sided weakness that has resolved.  Denies any toxic exposure at work Exam : awake/alert, no arm/leg drift, no facial droop Plan: will admit for TIA  tPA in stroke considered but not given due to:   Symptoms resolved    EKG Interpretation  Date/Time:  Monday November 17 2013 10:39:29 EDT Ventricular Rate:  91 PR Interval:  147 QRS Duration: 86 QT Interval:  385 QTC Calculation: 474 R Axis:   77 Text Interpretation:  Sinus rhythm Left atrial enlargement Left ventricular hypertrophy ST depression in inferior leads No previous ECGs available Confirmed by Christy Gentles  MD, Evani Shrider ([id number]) on 11/17/2013 10:48:36 AM        Sharyon Cable, MD 11/17/13 1332
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
## 2013-11-17 NOTE — ED Provider Notes (Signed)
CSN: [id number]     Arrival date & time 11/17/13  1026 History   First MD Initiated Contact with Patient 11/17/13 1031     Chief Complaint  Patient presents with  . Stroke Symptoms     (Consider location/radiation/quality/duration/timing/severity/associated sxs/prior Treatment) The history is provided by the patient and medical records.   This is a 63 year old male with past medical history significant for glaucoma, presenting to the ED with strokelike symptoms.  Patient has been feeling generally weak for the past 3 days, attempted to return to work earlier today. 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 On EMS arrival, patient did have noticeable left-sided weakness which has fully resolved at this time. Patient states he feels back to his baseline. He has no prior episodes of the same. He has no personal history of TIA or stroke. No family hx of stroke.  He does note a recent dry cough without fever or chills.   Patient is not currently on any anti-coagulants.  Patient was recently started on new eye drops for his glaucoma, no other medication changes.  Scheduled for Lasik eye surgery on 12/03/13.  Past Medical History  Diagnosis Date  . Cataract   . Glaucoma   . Osteoporosis    History reviewed. No pertinent past surgical history. History reviewed. No pertinent family history. History  Substance Use Topics  . Smoking status: Current Every Day Smoker  . Smokeless tobacco: Never Used  . Alcohol Use: Not on file    Review of Systems  Neurological: Positive for weakness.  All other systems reviewed and are negative.     Allergies  Review of patient's allergies indicates no known allergies.  Home Medications   Prior to Admission medications   Not on File   BP 110/83  Pulse 81  Temp(Src) 98.3 F (36.8 C)  (Oral)  Resp 19  SpO2 100%  Physical Exam  Nursing note and vitals reviewed. Constitutional: He is oriented to person, place, and time. He appears well-developed and well-nourished. No distress.  HENT:  Head: Normocephalic and atraumatic.  Mouth/Throat: Oropharynx is clear and moist.  Eyes: Conjunctivae and EOM are normal. Pupils are equal, round, and reactive to light.  Neck: Normal range of motion. Neck supple.  Cardiovascular: Normal rate, regular rhythm and normal heart sounds.   Pulmonary/Chest: Effort normal and breath sounds normal. No respiratory distress. He has no wheezes.  Abdominal: Soft. Bowel sounds are normal. There is no tenderness. There is no guarding.  Musculoskeletal: Normal range of motion.  Neurological: He is alert and oriented to person, place, and time.  AAOx3, answering questions and following commands appropriately; equal strength UE and LE bilaterally; CN grossly intact; moves all extremities appropriately without ataxia; normal coordination bilaterally; no dysmetria with finger to nose bilaterally; no pronator drift; no focal neuro deficits or facial asymmetry appreciated; gait not tested  Skin: Skin is warm and dry. He is not diaphoretic.  Psychiatric: He has a normal mood and affect.    ED Course  Procedures (including critical care time) Labs Review Labs Reviewed  CBC - Abnormal; Notable for the following:    RBC 4.21 (*)    Platelets 127 (*)    All other components within normal limits  DIFFERENTIAL - Abnormal; Notable for the following:    Neutrophils Relative % 83 (*)  Lymphocytes Relative 8 (*)    Lymphs Abs 0.6 (*)    All other components within normal limits  COMPREHENSIVE METABOLIC PANEL - Abnormal; Notable for the following:    Sodium 135 (*)    Glucose, Bld 117 (*)    Creatinine, Ser 1.61 (*)    GFR calc non Af Amer 44 (*)    GFR calc Af Amer 51 (*)    All other components within normal limits  ETHANOL  PROTIME-INR  APTT  URINE  RAPID DRUG SCREEN (HOSP PERFORMED)  URINALYSIS, ROUTINE W REFLEX MICROSCOPIC  I-STAT TROPOININ, ED    Imaging Review Dg Chest 2 View  11/17/2013   CLINICAL DATA:  Weakness. Left-sided paralysis which subsequently resolved. Illness for several days.  EXAM: CHEST  2 VIEW  COMPARISON:  None.  FINDINGS: Cardiothoracic index 52% compatible with mild enlargement. No edema. Mildly tortuous thoracic aorta.  The lungs appear clear.  IMPRESSION: 1. Mildly enlarged cardiopericardial silhouette. Otherwise, no significant abnormalities are observed.   Electronically Signed   By: Sherryl Barters M.D.   On: 11/17/2013 12:24   Ct Head Wo Contrast  11/17/2013   CLINICAL DATA:  Felt weak while working outside. Episode of left-sided paralysis. Symptoms have resolved. 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  EXAM: CT HEAD WITHOUT CONTRAST  TECHNIQUE: Contiguous axial images were obtained from the base of the skull through the vertex without intravenous contrast.  COMPARISON:  None.  FINDINGS: Remote left frontal craniotomy. Underlying encephalomalacia. Findings consistent with patient's remote injury.  No intracranial hemorrhage.  Right caudate head and mid to anterior right centrum semiovale hypodensity have an appearance more suggestive of remote infarction rather than acute infarct.  No CT evidence of large acute infarct.  Calcification anterior right frontal lobe may reflect result prior trauma or infection. No surrounding mass identified on this unenhanced exam.  No hydrocephalus.  IMPRESSION: Remote left frontal craniotomy. Underlying encephalomalacia. Findings consistent with patient's remote injury.  No intracranial hemorrhage.  Right caudate head and mid to anterior right centrum semiovale hypodensity have an appearance more suggestive of remote infarction rather than acute infarct.  No CT evidence of large acute infarct.  Calcification anterior right frontal lobe may reflect result prior trauma or  infection.   Electronically Signed   By: Chauncey Cruel M.D.   On: 11/17/2013 11:56     EKG Interpretation   Date/Time:  Monday November 17 2013 10:39:29 EDT Ventricular Rate:  91 PR Interval:  147 QRS Duration: 86 QT Interval:  385 QTC Calculation: 474 R Axis:   77 Text Interpretation:  Sinus rhythm Left atrial enlargement Left  ventricular hypertrophy ST depression in inferior leads No previous ECGs  available Confirmed by Christy Gentles  MD, Elenore Rota ([id number]) on 11/17/2013 10:48:36  AM      MDM   Final diagnoses:  Transient cerebral ischemia, unspecified transient cerebral ischemia type   63 year old male with episode of dizziness followed by left-sided paralysis. Symptoms have resolved prior to arrival in ED and thus does not qualify as code stroke. Patient has no prior history of TIA, CVA, or seizure disorder.  Will proceed with stroke work-up.  EKG sinus rhythm without ischemic change. Troponin is negative. Lab work is reassuring. Chest x-ray is clear. CT head revealing remote left frontal craniotomy as well as remote infarction, however no acute findings.  Patient given dose of ASA.  He was evaluated by neurology at bedside and recommended admission for TIA work-up.  Case discussed with  IM teaching service, will evaluate in ED and admit.  Larene Pickett, PA-C 11/17/13 1438

## 2013-11-17 NOTE — ED Notes (Signed)
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

## 2013-11-17 NOTE — ED Notes (Signed)
Pt to xray

## 2013-11-17 NOTE — ED Provider Notes (Signed)
Medical screening examination/treatment/procedure(s) were conducted as a shared visit with non-physician practitioner(s) and myself.  I personally evaluated the patient during the encounter.   EKG Interpretation   Date/Time:  Monday November 17 2013 10:39:29 EDT Ventricular Rate:  91 PR Interval:  147 QRS Duration: 86 QT Interval:  385 QTC Calculation: 474 R Axis:   77 Text Interpretation:  Sinus rhythm Left atrial enlargement Left  ventricular hypertrophy ST depression in inferior leads No previous ECGs  available Confirmed by Christy Gentles  MD, Blessing Zaucha ([id number]) on 11/17/2013 10:48:36  AM        Sharyon Cable, MD 11/17/13 [phone]

## 2013-11-18 ENCOUNTER — Encounter (HOSPITAL_COMMUNITY): Payer: Self-pay | Admitting: Nurse Practitioner

## 2013-11-18 DIAGNOSIS — N179 Acute kidney failure, unspecified: Secondary | ICD-10-CM | POA: Diagnosis present

## 2013-11-18 DIAGNOSIS — Z79899 Other long term (current) drug therapy: Secondary | ICD-10-CM | POA: Diagnosis not present

## 2013-11-18 DIAGNOSIS — H409 Unspecified glaucoma: Secondary | ICD-10-CM | POA: Diagnosis present

## 2013-11-18 DIAGNOSIS — H538 Other visual disturbances: Secondary | ICD-10-CM | POA: Diagnosis present

## 2013-11-18 DIAGNOSIS — I10 Essential (primary) hypertension: Secondary | ICD-10-CM | POA: Diagnosis present

## 2013-11-18 DIAGNOSIS — I369 Nonrheumatic tricuspid valve disorder, unspecified: Secondary | ICD-10-CM

## 2013-11-18 DIAGNOSIS — I6330 Cerebral infarction due to thrombosis of unspecified cerebral artery: Secondary | ICD-10-CM | POA: Diagnosis present

## 2013-11-18 DIAGNOSIS — I6529 Occlusion and stenosis of unspecified carotid artery: Secondary | ICD-10-CM | POA: Diagnosis present

## 2013-11-18 DIAGNOSIS — I6350 Cerebral infarction due to unspecified occlusion or stenosis of unspecified cerebral artery: Secondary | ICD-10-CM | POA: Diagnosis not present

## 2013-11-18 DIAGNOSIS — F17200 Nicotine dependence, unspecified, uncomplicated: Secondary | ICD-10-CM | POA: Diagnosis present

## 2013-11-18 DIAGNOSIS — G8190 Hemiplegia, unspecified affecting unspecified side: Secondary | ICD-10-CM | POA: Diagnosis present

## 2013-11-18 DIAGNOSIS — F1410 Cocaine abuse, uncomplicated: Secondary | ICD-10-CM | POA: Diagnosis present

## 2013-11-18 DIAGNOSIS — I639 Cerebral infarction, unspecified: Secondary | ICD-10-CM | POA: Diagnosis present

## 2013-11-18 DIAGNOSIS — R197 Diarrhea, unspecified: Secondary | ICD-10-CM | POA: Diagnosis present

## 2013-11-18 DIAGNOSIS — M810 Age-related osteoporosis without current pathological fracture: Secondary | ICD-10-CM | POA: Diagnosis present

## 2013-11-18 DIAGNOSIS — Z823 Family history of stroke: Secondary | ICD-10-CM | POA: Diagnosis not present

## 2013-11-18 DIAGNOSIS — L299 Pruritus, unspecified: Secondary | ICD-10-CM | POA: Diagnosis present

## 2013-11-18 DIAGNOSIS — N390 Urinary tract infection, site not specified: Secondary | ICD-10-CM | POA: Diagnosis present

## 2013-11-18 DIAGNOSIS — H5710 Ocular pain, unspecified eye: Secondary | ICD-10-CM | POA: Diagnosis not present

## 2013-11-18 DIAGNOSIS — B369 Superficial mycosis, unspecified: Secondary | ICD-10-CM | POA: Diagnosis present

## 2013-11-18 LAB — BASIC METABOLIC PANEL
Anion gap: 13 (ref 5–15)
BUN: 20 mg/dL (ref 6–23)
CALCIUM: 8.1 mg/dL — AB (ref 8.4–10.5)
CO2: 23 meq/L (ref 19–32)
Chloride: 99 mEq/L (ref 96–112)
Creatinine, Ser: 1.23 mg/dL (ref 0.50–1.35)
GFR calc Af Amer: 70 mL/min — ABNORMAL LOW (ref >90)
GFR calc non Af Amer: 60 mL/min — ABNORMAL LOW (ref >90)
Glucose, Bld: 115 mg/dL — ABNORMAL HIGH (ref 70–99)
Potassium: 3.4 mEq/L — ABNORMAL LOW (ref 3.7–5.3)
SODIUM: 135 meq/L — AB (ref 137–147)

## 2013-11-18 LAB — LIPID PANEL
Cholesterol: 94 mg/dL (ref 0–200)
HDL: 38 mg/dL — ABNORMAL LOW (ref >39)
LDL CALC: 39 mg/dL (ref 0–99)
TRIGLYCERIDES: 86 mg/dL (ref <150)
Total CHOL/HDL Ratio: 2.5 RATIO
VLDL: 17 mg/dL (ref 0–40)

## 2013-11-18 LAB — HEMOGLOBIN A1C
Hgb A1c MFr Bld: 5 % (ref <5.7)
Mean Plasma Glucose: 97 mg/dL (ref <117)

## 2013-11-18 LAB — TROPONIN I
Troponin I: <0.3 ng/mL (ref <0.30)
Troponin I: <0.3 ng/mL (ref <0.30)

## 2013-11-18 MED ORDER — CLOPIDOGREL BISULFATE 75 MG PO TABS
75.0000 mg | ORAL_TABLET | Freq: Every day | ORAL | Status: DC
  Administered 2013-11-18 – 2013-11-19 (×2): 75 mg via ORAL
  Filled 2013-11-18 (×2): qty 1

## 2013-11-18 MED ORDER — POTASSIUM CHLORIDE CRYS ER 20 MEQ PO TBCR
40.0000 meq | EXTENDED_RELEASE_TABLET | Freq: Once | ORAL | Status: AC
  Administered 2013-11-18: 40 meq via ORAL
  Filled 2013-11-18: qty 2

## 2013-11-18 NOTE — Progress Notes (Signed)
Patient told me that he has been having "water like" diarrhea since Sunday. Also noted in chart an occasional low grade temp with vital signs. MD paged to see if contact precautions should be placed/ stool sample order needed. Awaiting response
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
## 2013-11-18 NOTE — Progress Notes (Signed)
Pt seen and examined with Dr. Sherrine Maples. Please refer to resident note for details  In brief, pt is a 63 y/o male with PMH of HTN, glaucoma, remote head injury, cataracts, osteoporosis p/w weakness, decreased sensation on the left side of his body. No CP, no sob, no fevers/chills, no HA, no blurry vision, no tingling/numbness. Pt states symptoms persisted till EMS arrived and then gradually resolved. Pt also complains of multiple episodes of diarrhea * 2 days, no n/v, no abd pain. Remaining ROS negative  Pt feels well today. No new complaints. Still with diarrhea  Exam: Cardio: RRR, normal heart sounds Lungs: CTA b/l Abd: soft, non tender, non distended, BS + Gen: AAO*3, NAD Neuro: sensation intact, power 5/5 b/l UE, LE Ext- no edema  Assessment and Plan: 63 y/o male with  Acute CVA, diarrhea  CVA: - Neuro following - MRI with multiple foci of punctate infarction R frontal lobe - Carotid doppler with occluded R ICA - f/u 2 D ECHO. A1C wnl - Pt with + cocaine on U tox.   Diarrhea: - Pt with recurrent diarrhea - f/u stool pathogen and cdiff  AKI: - Now improved. Likely prerenal secondary to diarrhea - c/w IVF for now

## 2013-11-18 NOTE — Progress Notes (Signed)
GI pathogen panel sent to lab

## 2013-11-18 NOTE — Progress Notes (Signed)
Utilization review completed.  

## 2013-11-18 NOTE — Progress Notes (Signed)
PT Cancellation Note  Patient Details Name: Oscar Greene MRN: [id number] DOB: 19-Mar-1950   Cancelled Treatment:    Reason Eval/Treat Not Completed: OT screened, no needs identified, will sign off.  OT screened for PT needs.  Pt is at his baseline of functioning per OT (I also spoke with him personally).  PT will sign off.  See OT notes for details on mobility.   Thanks,    Barbarann Ehlers. [place], [place], DPT [phone]   11/18/2013, 11:17 AM

## 2013-11-18 NOTE — Progress Notes (Signed)
STROKE TEAM PROGRESS NOTE   HISTORY Oscar Greene is an 63 y.o. male who was at work in a tobacco field and this morning [phone] when at 9 AM he noticed sudden onset of dizziness as well as left-sided weakness. He had trouble moving his left hand and had decreased sensation. His symptoms lasted only about 30 minutes and started recovering at the time he came to the hospital he had completely resolved. He denied any complaint headache, slurred speech, double vision. He has no known prior history of strokes or TIAs. He has not seen a physician for years. He has had glaucoma diagnosed in recent years and has some arthritis for which he takes Motrin twice daily. He has a remote history of a left frontal craniotomy for a blood clot evacuation with no residual neurological deficits or history of seizures  Patient was not administered TPA secondary to Complete resolution of deficits. He was admitted for further evaluation and treatment.   SUBJECTIVE (INTERVAL HISTORY) No family is at the bedside.  Overall he feels his condition is gradually worsening. He is complaining of ongoing diarrhea, "it just runs right through me". He denies knowing he has a carotid occlusion.   OBJECTIVE Temp:  [97.4 F (36.3 C)-100.9 F (38.3 C)] 98.6 F (37 C) (09/08 1035) Pulse Rate:  [72-110] 72 (09/08 1035) Cardiac Rhythm:  [-] Normal sinus rhythm (09/08 0900) Resp:  [16-27] 18 (09/08 1035) BP: (97-156)/(61-103) 104/71 mmHg (09/08 1035) SpO2:  [97 %-100 %] 99 % (09/08 1035) Weight:  [67.9 kg (149 lb 11.1 oz)] 67.9 kg (149 lb 11.1 oz) (09/07 1931)   Recent Labs Lab 11/17/13 1208 11/18/13 0002  NA 135* 135*  K 4.0 3.4*  CL 99 99  CO2 25 23  GLUCOSE 117* 115*  BUN 22 20  CREATININE 1.61* 1.23  CALCIUM 8.6 8.1*    Recent Labs Lab 11/17/13 1208  AST 21  ALT 13  ALKPHOS 61  BILITOT 0.8  PROT 6.8  ALBUMIN 3.7    Recent Labs Lab 11/17/13 1208  WBC 7.8  NEUTROABS 6.5  HGB 13.4  HCT 39.8  MCV  94.5  PLT 127*    Recent Labs Lab 11/17/13 1809 11/18/13 0002 11/18/13 0800  TROPONINI <0.30 <0.30 <0.30    Recent Labs  11/17/13 1208  LABPROT 14.9  INR 1.17    Recent Labs  11/17/13 1356  COLORURINE AMBER*  LABSPEC 1.026  PHURINE 5.5  GLUCOSEU NEGATIVE  HGBUR SMALL*  BILIRUBINUR NEGATIVE  KETONESUR NEGATIVE  PROTEINUR 100*  UROBILINOGEN 0.2  NITRITE NEGATIVE  LEUKOCYTESUR NEGATIVE       Component Value Date/Time   CHOL 94 11/18/2013 0002   TRIG 86 11/18/2013 0002   HDL 38* 11/18/2013 0002   CHOLHDL 2.5 11/18/2013 0002   VLDL 17 11/18/2013 0002   LDLCALC 39 11/18/2013 0002   Lab Results  Component Value Date   HGBA1C 5.0 11/18/2013      Component Value Date/Time   LABOPIA NONE DETECTED 11/17/2013 1356   COCAINSCRNUR POSITIVE* 11/17/2013 1356   LABBENZ NONE DETECTED 11/17/2013 1356   AMPHETMU NONE DETECTED 11/17/2013 1356   THCU NONE DETECTED 11/17/2013 1356   LABBARB NONE DETECTED 11/17/2013 1356     Recent Labs Lab 11/17/13 [place] <11    Dg Chest 2 View 11/17/2013   1. Mildly enlarged cardiopericardial silhouette. Otherwise, no significant abnormalities are observed.     Ct Head Wo Contrast 11/17/2013    Remote left frontal craniotomy. Underlying encephalomalacia.  Findings consistent with patient's remote injury.  No intracranial hemorrhage.  Right caudate head and mid to anterior right centrum semiovale hypodensity have an appearance more suggestive of remote infarction rather than acute infarct.  No CT evidence of large acute infarct.  Calcification anterior right frontal lobe may reflect result prior trauma or infection.     MRI & MRA Head/brain Wo Cm 11/17/2013   1. Multiple foci of punctate acute infarction in the right frontal lobe. 2. 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 3. Left frontal lobe encephalomalacia at site of prior craniotomy. 4. 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 Distal  reconstitution of the carotid terminus, likely via external carotid flow. Proximal right MCA is patent, although with diminished flow.      PHYSICAL EXAM Pleasant middle aged african [place] male not in distress.Awake alert. Afebrile. Head is nontraumatic. Neck is supple without bruit. Hearing is normal. Cardiac exam no murmur or gallop. Lungs are clear to auscultation. Distal pulses are well felt. Neurological Exam ;  Awake  Alert oriented x 3. Normal speech and language.eye movements full without nystagmus.fundi were not visualized. Vision acuity and fields appear normal. Hearing is normal. Palatal movements are normal. Face symmetric. Tongue midline. Normal strength, tone, reflexes and coordination. Normal sensation. Gait deferred. ASSESSMENT/PLAN  [name] is a 63 y.o. male with a hx of glaucoma, arthritis and old left frontal craniotomy for blood clot evacuation without residual neuro deficits or seizures presenting with transient left sided weakness and dizziness. He did not receive IV t-PA due to complete resolution of symptoms. Imaging confirms multiple punctate right frontal lobe infarcts. He was considered for the SOCRATES study, but opted not to participate. Stroke work up underway.  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   ambulate  Therapy needs:    Ongoing aggressive risk factor management  Risk factor education  Patient counseled to be compliant with his antithrombotic  medications  Disposition:  home  Hypertension   Home meds:  None  BP 97-167/67-92 past 24h (11/18/2013 @ 12:14 PM)  Allow permissive hypertension first 24h  Stable  Other Stroke Risk Factors Cocaine positive this admission Cigarette smoker, advised to stop smoking   Family hx stroke (father)  High fat, red meat diet  Other Active Problems  Watery stool, c diff sent  Other Pertinent History  Has not seen a MD in years  Hospital day # 1  [facility] BIBY, MSN, RN, ANVP-BC, ANP-BC, GNP-BC [facility] Pager: [phone] 11/18/2013 1:51 PM   I have personally examined this patient, reviewed notes, independently viewed imaging studies, participated in medical decision making and plan of care. I have made any additions or clarifications directly to the above note. Agree with note above.   Antony Contras, MD Medical Director [facility] Pager: [phone] 11/18/2013 2:29 PM   To contact Stroke Continuity provider, please refer to  [email or web address] After hours, contact General Neurology

## 2013-11-18 NOTE — Progress Notes (Signed)
Subjective: [name] feels well this morning. He continues to have no further symptoms of weakness or numbness.   Objective: Vital signs in last 24 hours: Filed Vitals:   11/17/13 2357 11/18/13 0207 11/18/13 0534 11/18/13 1035  BP: 113/67 112/69 97/67 104/71  Pulse: 84 80 87 72  Temp: 99.1 F (37.3 C) 99.7 F (37.6 C) 99 F (37.2 C) 98.6 F (37 C)  TempSrc: Oral Oral Oral Oral  Resp: 18 18 18 18   Height:      Weight:      SpO2: 99% 100% 99% 99%   Weight change:   Intake/Output Summary (Last 24 hours) at 11/18/13 1144 Last data filed at 11/18/13 0900  Gross per 24 hour  Intake    240 ml  Output      0 ml  Net    240 ml   Physical Exam: Appearance: lying in bed in NAD, watching TV HEENT: left frontal scar and depression from prior surgery, otherwise AT/[place], PERRL, EOMi, no lymphadenopathy  Neurologic: A&Ox3, patient has some mumbling speech without slurring (family confirmed on admission that this was his baseline), CN II-XII intact, strength and sensation 5/5 throughout, coordination intact (FNF and heel-to-shin), two-point discrimination intact, did not observe gait  Heart: RRR, normal S1S2, no MRG, no carotid bruits  Lungs: CTAB, no WRR  Abdomen: thin, BS+, soft, nontender  Extremities: no edema  Skin: area of hyperpigmented skin on shin of left lower extremity, 5"x3", thickened, cream was applied last night  Lab Results: Basic Metabolic Panel:  Recent Labs Lab 11/17/13 1208 11/18/13 0002  NA 135* 135*  K 4.0 3.4*  CL 99 99  CO2 25 23  GLUCOSE 117* 115*  BUN 22 20  CREATININE 1.61* 1.23  CALCIUM 8.6 8.1*   Liver Function Tests:  Recent Labs Lab 11/17/13 1208  AST 21  ALT 13  ALKPHOS 61  BILITOT 0.8  PROT 6.8  ALBUMIN 3.7   CBC:  Recent Labs Lab 11/17/13 1208  WBC 7.8  NEUTROABS 6.5  HGB 13.4  HCT 39.8  MCV 94.5  PLT 127*   Cardiac Enzymes:  Recent Labs Lab 11/17/13 1809 11/18/13 0002 11/18/13 0800  TROPONINI <0.30 <0.30  <0.30   Fasting Lipid Panel:  Recent Labs Lab 11/18/13 0002  CHOL 94  HDL 38*  LDLCALC 39  TRIG 86  CHOLHDL 2.5   Coagulation:  Recent Labs Lab 11/17/13 1208  LABPROT 14.9  INR 1.17   Urine Drug Screen: Drugs of Abuse     Component Value Date/Time   LABOPIA NONE DETECTED 11/17/2013 1356   COCAINSCRNUR POSITIVE* 11/17/2013 1356   LABBENZ NONE DETECTED 11/17/2013 1356   AMPHETMU NONE DETECTED 11/17/2013 1356   THCU NONE DETECTED 11/17/2013 1356   LABBARB NONE DETECTED 11/17/2013 1356    Alcohol Level:  Recent Labs Lab 11/17/13 1208  ETH <11   Urinalysis:  Recent Labs Lab 11/17/13 1356  COLORURINE AMBER*  LABSPEC 1.026  PHURINE 5.5  GLUCOSEU NEGATIVE  HGBUR SMALL*  BILIRUBINUR NEGATIVE  KETONESUR NEGATIVE  PROTEINUR 100*  UROBILINOGEN 0.2  NITRITE NEGATIVE  LEUKOCYTESUR NEGATIVE    Micro Results: Recent Results (from the past 240 hour(s))  MRSA PCR SCREENING     Status: None   Collection Time    11/17/13  7:27 PM      Result Value Ref Range Status   MRSA by PCR NEGATIVE  NEGATIVE Final   Comment:            The  GeneXpert MRSA Assay (FDA     approved for NASAL specimens     only), is one component of a     comprehensive MRSA colonization     surveillance program. It is not     intended to diagnose MRSA     infection nor to guide or     monitor treatment for     MRSA infections.   Studies/Results: Dg Chest 2 View  11/17/2013   CLINICAL DATA:  Weakness. Left-sided paralysis which subsequently resolved. Illness for several days.  EXAM: CHEST  2 VIEW  COMPARISON:  None.  FINDINGS: Cardiothoracic index 52% compatible with mild enlargement. No edema. Mildly tortuous thoracic aorta.  The lungs appear clear.  IMPRESSION: 1. Mildly enlarged cardiopericardial silhouette. Otherwise, no significant abnormalities are observed.   Electronically Signed   By: Sherryl Barters M.D.   On: 11/17/2013 12:24   Ct Head Wo Contrast  11/17/2013   CLINICAL DATA:  Felt weak while  working outside. Episode of left-sided paralysis. Symptoms have resolved. 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  EXAM: CT HEAD WITHOUT CONTRAST  TECHNIQUE: Contiguous axial images were obtained from the base of the skull through the vertex without intravenous contrast.  COMPARISON:  None.  FINDINGS: Remote left frontal craniotomy. Underlying encephalomalacia. Findings consistent with patient's remote injury.  No intracranial hemorrhage.  Right caudate head and mid to anterior right centrum semiovale hypodensity have an appearance more suggestive of remote infarction rather than acute infarct.  No CT evidence of large acute infarct.  Calcification anterior right frontal lobe may reflect result prior trauma or infection. No surrounding mass identified on this unenhanced exam.  No hydrocephalus.  IMPRESSION: Remote left frontal craniotomy. Underlying encephalomalacia. Findings consistent with patient's remote injury.  No intracranial hemorrhage.  Right caudate head and mid to anterior right centrum semiovale hypodensity have an appearance more suggestive of remote infarction rather than acute infarct.  No CT evidence of large acute infarct.  Calcification anterior right frontal lobe may reflect result prior trauma or infection.   Electronically Signed   By: Chauncey Cruel M.D.   On: 11/17/2013 11:56   Mr Brain Wo Contrast  11/17/2013   CLINICAL DATA:  Transient episode of left-sided weakness. Evaluate for stroke.  EXAM: MRI HEAD WITHOUT CONTRAST  MRA HEAD WITHOUT CONTRAST  TECHNIQUE: Multiplanar, multiecho pulse sequences of the brain and surrounding structures were obtained without intravenous contrast. Angiographic images of the head were obtained using MRA technique without contrast.  COMPARISON:  Head CT 11/17/2013  FINDINGS: MRI HEAD FINDINGS  Multiple punctate foci of restricted diffusion are present in the right frontal lobe involving cortex and subcortical white matter in the MCA territory.  There is no evidence of intracranial hemorrhage, mass, midline shift, or extra-axial fluid collection. A small calcification is again seen in the right frontal lobe. Sequelae of prior left frontal craniotomy are again identified with underlying left frontal lobe encephalomalacia. Scattered foci of T2 hyperintensity in the subcortical and deep cerebral white matter are nonspecific but compatible with mild chronic small vessel ischemic disease. There is mild generalized cerebral atrophy. An old lacunar infarct is noted in the right caudate.  Orbits are unremarkable. Mild right maxillary sinus mucosal thickening is noted. Mastoid air cells are clear. Abnormal appearance of the intracranial right ICA flow void is consistent with proximal occlusion in the neck. Mild upper cervical spondylosis is partially visualized with marrow changes at C4-5 favored to be degenerative.  MRA HEAD FINDINGS  The visualized distal vertebral arteries are patent and codominant. PICA origins are patent. AICA and SCA origins are patent. Basilar artery is patent without stenosis. PCAs are unremarkable. Posterior communicating arteries are not clearly identified.  Lack of flow related enhancement in the imaged, distal cervical right ICA and majority of the intracranial right ICA is consistent with proximal occlusion in the neck. Minimal flow related enhancement in the right supraclinoid ICA may reflect ECA collateral flow via the ophthalmic artery. The right M1 segment is patent without focal stenosis, however there is diminished flow within the M1 segment as well as in MCA branch vessels compared to the left. The number of right MCA distal branch vessels also appears diminished compared to the left. There is minimal flow related enhancement in the right A1 segment, with the right A2 segment presumably receiving the majority of its supply via the anterior communicating artery, although this is not well seen.  Left internal carotid artery is  patent from skullbase to carotid terminus without stenosis. Left MCA and left ACA are unremarkable. No intracranial aneurysm is identified.  IMPRESSION: 1. Multiple foci of punctate acute infarction in the right frontal lobe. 2. 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 3. Left frontal lobe encephalomalacia at site of prior craniotomy. 4. 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 Distal reconstitution of the carotid terminus, likely via external carotid flow. Proximal right MCA is patent, although with diminished flow.   Electronically Signed   By: Logan Bores   On: 11/17/2013 17:04   Mr Jodene Nam Head/brain Wo Cm  11/17/2013   CLINICAL DATA:  Transient episode of left-sided weakness. Evaluate for stroke.  EXAM: MRI HEAD WITHOUT CONTRAST  MRA HEAD WITHOUT CONTRAST  TECHNIQUE: Multiplanar, multiecho pulse sequences of the brain and surrounding structures were obtained without intravenous contrast. Angiographic images of the head were obtained using MRA technique without contrast.  COMPARISON:  Head CT 11/17/2013  FINDINGS: MRI HEAD FINDINGS  Multiple punctate foci of restricted diffusion are present in the right frontal lobe involving cortex and subcortical white matter in the MCA territory. There is no evidence of intracranial hemorrhage, mass, midline shift, or extra-axial fluid collection. A small calcification is again seen in the right frontal lobe. Sequelae of prior left frontal craniotomy are again identified with underlying left frontal lobe encephalomalacia. Scattered foci of T2 hyperintensity in the subcortical and deep cerebral white matter are nonspecific but compatible with mild chronic small vessel ischemic disease. There is mild generalized cerebral atrophy. An old lacunar infarct is noted in the right caudate.  Orbits are unremarkable. Mild right maxillary sinus mucosal thickening is noted. Mastoid air cells are clear. Abnormal appearance  of the intracranial right ICA flow void is consistent with proximal occlusion in the neck. Mild upper cervical spondylosis is partially visualized with marrow changes at C4-5 favored to be degenerative.  MRA HEAD FINDINGS  The visualized distal vertebral arteries are patent and codominant. PICA origins are patent. AICA and SCA origins are patent. Basilar artery is patent without stenosis. PCAs are unremarkable. Posterior communicating arteries are not clearly identified.  Lack of flow related enhancement in the imaged, distal cervical right ICA and majority of the intracranial right ICA is consistent with proximal occlusion in the neck. Minimal flow related enhancement in the right supraclinoid ICA may reflect ECA collateral flow via the ophthalmic artery. The right M1 segment is patent without focal stenosis, however there is diminished flow within  the M1 segment as well as in MCA branch vessels compared to the left. The number of right MCA distal branch vessels also appears diminished compared to the left. There is minimal flow related enhancement in the right A1 segment, with the right A2 segment presumably receiving the majority of its supply via the anterior communicating artery, although this is not well seen.  Left internal carotid artery is patent from skullbase to carotid terminus without stenosis. Left MCA and left ACA are unremarkable. No intracranial aneurysm is identified.  IMPRESSION: 1. Multiple foci of punctate acute infarction in the right frontal lobe. 2. 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 3. Left frontal lobe encephalomalacia at site of prior craniotomy. 4. 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 Distal reconstitution of the carotid terminus, likely via external carotid flow. Proximal right MCA is patent, although with diminished flow.   Electronically Signed   By: Logan Bores   On: 11/17/2013 17:04   Medications: I have  reviewed the patient's current medications. Scheduled Meds: . aspirin  324 mg Oral Daily  . heparin  5,000 Units Subcutaneous 3 times per day  . terbinafine   Topical BID   Continuous Infusions: . sodium chloride 75 mL/hr at 11/17/13 1728   PRN Meds:.acetaminophen, polyvinyl alcohol, senna-docusate Assessment/Plan: Active Problems:   Cerebral thrombosis with cerebral infarction   Renal insufficiency   Cocaine use   Hypertension   Glaucoma   Fungal skin infection  [name] is a 63 yo smoker with hypertension and a family history of CVA who has been found to have multiple foci of right frontal lobe punctate infarction and an occluded right ICA. His cocaine screen was also positive. His symptoms, however, had fully resolved by the time he got to the ED on the day of admission. His workup now includes an echo to rule out accompanying cardiac abnormalities, carotid doppler study to investigate the ICA and extensive lifestyle risk modulation counseling.   CVA: patient was found to have multiple foci of punctate acute infarction in the right frontal lobe along with occlusion of the right ICA on MRI/MRA.  - Neurology following - PT, OT signed off with no need for further therapy - Speech therapy consult pending - f/u carotid dopplers  - f/u echo - f/u hgbA1c  - neuro checks  - risk factor modification counseling   Cocaine Abuse:  - provide counseling, education   Pain behind eyes: patient follows with ophthalmology and has "a laser procedure" scheduled at the end of the month. Patient's pain has mostly resolved; continue to follow, continue isopto drops (home med for dry eye)  - continue home isopto drops  Diarrhea: patient had diarrhea overnight (4-5 episodes) - f/u stool pathogen panel   Renal Insufficiency (creatinine 1.6-->1.23 with no baseline):  - repeat BMP in am  - continueIVF at rate of 75  - borderline UTI, f/u urine culture   Skin Lesion on RLE: likely fungal  -  continue terbinafine cream   DVT Ppx:  - [place] heparin   Diet: heart healthy  Dispo: Disposition is deferred at this time, awaiting improvement of current medical problems.  Anticipated discharge in approximately 1-2 day(s).   The patient does have a current PCP Orlena Sheldon, PA-C) and does not need an [facility] hospital follow-up appointment after discharge.  The patient does not have transportation limitations that hinder transportation to clinic appointments.  .Services Needed at time of  discharge: Y = Yes, Blank = No PT:   OT:   RN:   Equipment:   Other:     LOS: 1 day   Drucilla Schmidt, MD 11/18/2013, 11:44 AM

## 2013-11-18 NOTE — Progress Notes (Signed)
VASCULAR LAB PRELIMINARY  PRELIMINARY  PRELIMINARY  PRELIMINARY  Carotid duplex  completed.    Preliminary report:  Right:  Occluded internal carotid artery.  Left:  1-39% ICA stenosis.  Bilateral:  Vertebral artery flow is antegrade.     Kyan Giannone, RVT 11/18/2013, 2:16 PM

## 2013-11-18 NOTE — Progress Notes (Signed)
  Echocardiogram 2D Echocardiogram has been performed.  Oscar Greene [name] 11/18/2013, 2:22 PM

## 2013-11-19 DIAGNOSIS — I6330 Cerebral infarction due to thrombosis of unspecified cerebral artery: Secondary | ICD-10-CM

## 2013-11-19 DIAGNOSIS — F17200 Nicotine dependence, unspecified, uncomplicated: Secondary | ICD-10-CM

## 2013-11-19 DIAGNOSIS — I6350 Cerebral infarction due to unspecified occlusion or stenosis of unspecified cerebral artery: Secondary | ICD-10-CM

## 2013-11-19 DIAGNOSIS — R197 Diarrhea, unspecified: Secondary | ICD-10-CM | POA: Diagnosis present

## 2013-11-19 DIAGNOSIS — N179 Acute kidney failure, unspecified: Secondary | ICD-10-CM

## 2013-11-19 LAB — URINE CULTURE
COLONY COUNT: NO GROWTH
Culture: NO GROWTH

## 2013-11-19 LAB — BASIC METABOLIC PANEL
ANION GAP: 11 (ref 5–15)
BUN: 15 mg/dL (ref 6–23)
CALCIUM: 8.3 mg/dL — AB (ref 8.4–10.5)
CO2: 23 mEq/L (ref 19–32)
Chloride: 105 mEq/L (ref 96–112)
Creatinine, Ser: 1.07 mg/dL (ref 0.50–1.35)
GFR calc Af Amer: 83 mL/min — ABNORMAL LOW (ref >90)
GFR calc non Af Amer: 71 mL/min — ABNORMAL LOW (ref >90)
GLUCOSE: 103 mg/dL — AB (ref 70–99)
Potassium: 3.9 mEq/L (ref 3.7–5.3)
Sodium: 139 mEq/L (ref 137–147)

## 2013-11-19 LAB — GI PATHOGEN PANEL BY PCR, STOOL
C DIFFICILE TOXIN A/B: NEGATIVE
CRYPTOSPORIDIUM BY PCR: NEGATIVE
Campylobacter by PCR: POSITIVE
E COLI 0157 BY PCR: NEGATIVE
E coli (ETEC) LT/ST: NEGATIVE
E coli (STEC): NEGATIVE
G LAMBLIA BY PCR: NEGATIVE
Norovirus GI/GII: NEGATIVE
Rotavirus A by PCR: NEGATIVE
SHIGELLA BY PCR: NEGATIVE
Salmonella by PCR: NEGATIVE

## 2013-11-19 LAB — CLOSTRIDIUM DIFFICILE BY PCR: CDIFFPCR: NEGATIVE

## 2013-11-19 MED ORDER — ASPIRIN EC 81 MG PO TBEC: 81.0000 mg | DELAYED_RELEASE_TABLET | Freq: Every day | ORAL | Status: DC

## 2013-11-19 MED ORDER — CLOPIDOGREL BISULFATE 75 MG PO TABS: 75.0000 mg | ORAL_TABLET | Freq: Every day | ORAL | Status: DC

## 2013-11-19 MED ORDER — NYSTATIN 100000 UNIT/GM EX CREA: 1.0000 "application " | TOPICAL_CREAM | Freq: Two times a day (BID) | CUTANEOUS | Status: DC

## 2013-11-19 NOTE — Discharge Instructions (Signed)
Please start taking one aspirin (81 mg) and one plavix (75 mg) every day for stroke prevention. Follow the guidelines we discussed to improve your diet, stop smoking and stop using cocaine. All of these things will help to reduce the risk of another, more debilitating stroke.  You may use the cream on your leg rash twice a day.   Stimulant Use Disorder-Cocaine Cocaine is one of a group of powerful drugs called stimulants. Cocaine has medical uses for stopping nosebleeds and for pain control before minor nose or dental surgery. However, cocaine is misused because of the effects that it produces. These effects include:   A feeling of extreme pleasure.  Alertness.  High energy. Common street names for cocaine include coke, crack, blow, snow, and nose candy. Cocaine is snorted, dissolved in water and injected, or smoked.  Stimulants are addictive because they activate regions of the brain that produce both the pleasurable sensation of "reward" and psychological dependence. Together, these actions account for loss of control and the rapid development of drug dependence. This means you become ill without the drug (withdrawal) and need to keep using it to function.  Stimulant use disorder is use of stimulants that disrupts your daily life. It disrupts relationships with family and friends and how you do your job. Cocaine increases your blood pressure and heart rate. It can cause a heart attack or stroke. Cocaine can also cause death from irregular heart rate or seizures. SYMPTOMS Symptoms of stimulant use disorder with cocaine include:  Use of cocaine in larger amounts or over a longer period of time than intended.  Unsuccessful attempts to cut down or control cocaine use.  A lot of time spent obtaining, using, or recovering from the effects of cocaine.  A strong desire or urge to use cocaine (craving).  Continued use of cocaine in spite of major problems at work, school, or home because of  use.  Continued use of cocaine in spite of relationship problems because of use.  Giving up or cutting down on important life activities because of cocaine use.  Use of cocaine over and over in situations when it is physically hazardous, such as driving a car.  Continued use of cocaine in spite of a physical problem that is likely related to use. Physical problems can include:  Malnutrition.  Nosebleeds.  Chest pain.  High blood pressure.  A hole that develops between the part of your nose that separates your nostrils (perforated nasal septum).  Lung and kidney damage.  Continued use of cocaine in spite of a mental problem that is likely related to use. Mental problems can include:  Schizophrenia-like symptoms.  Depression.  Bipolar mood swings.  Anxiety.  Sleep problems.  Need to use more and more cocaine to get the same effect, or lessened effect over time with use of the same amount of cocaine (tolerance).  Having withdrawal symptoms when cocaine use is stopped, or using cocaine to reduce or avoid withdrawal symptoms. Withdrawal symptoms include:  Depressed or irritable mood.  Low energy or restlessness.  Bad dreams.  Poor or excessive sleep.  Increased appetite. DIAGNOSIS Stimulant use disorder is diagnosed by your health care provider. You may be asked questions about your cocaine use and how it affects your life. A physical exam may be done. A drug screen may be ordered. You may be referred to a mental health professional. The diagnosis of stimulant use disorder requires at least two symptoms within 12 months. The type of stimulant use disorder  depends on the number of signs and symptoms you have. The type may be:  Mild. Two or three signs and symptoms.  Moderate. Four or five signs and symptoms.  Severe. Six or more signs and symptoms. TREATMENT Treatment for stimulant use disorder is usually provided by mental health professionals with training in  substance use disorders. The following options are available:  Counseling or talk therapy. Talk therapy addresses the reasons you use cocaine and ways to keep you from using again. Goals of talk therapy include:  Identifying and avoiding triggers for use.  Handling cravings.  Replacing use with healthy activities.  Support groups. Support groups provide emotional support, advice, and guidance.  Medicine. Certain medicines may decrease cocaine cravings or withdrawal symptoms. HOME CARE INSTRUCTIONS  Take medicines only as directed by your health care provider.  Identify the people and activities that trigger your cocaine use and avoid them.  Keep all follow-up visits as directed by your health care provider. SEEK MEDICAL CARE IF:  Your symptoms get worse or you relapse.  You are not able to take medicines as directed. SEEK IMMEDIATE MEDICAL CARE IF:  You have serious thoughts about hurting yourself or others.  You have a seizure, chest pain, sudden weakness, or loss of speech or vision. [place] on Drug Abuse: motorcyclefax.com  Substance Abuse and Mental Health Services Administration: ktimeonline.com Document Released: 02/25/2000 Document Revised: 07/14/2013 Document Reviewed: 03/12/2013 [facility] Patient Information 2015 [place], [place]. This information is not intended to replace advice given to you by your health care provider. Make sure you discuss any questions you have with your health care provider. Stroke Prevention Some medical conditions and behaviors are associated with an increased chance of having a stroke. You may prevent a stroke by making healthy choices and managing medical conditions. HOW CAN I REDUCE MY RISK OF HAVING A STROKE?   Stay physically active. Get at least 30 minutes of activity on most or all days.  Do not smoke. It may also be helpful to avoid exposure to secondhand smoke.  Limit alcohol use. Moderate alcohol  use is considered to be:  No more than 2 drinks per day for men.  No more than 1 drink per day for nonpregnant women.  Eat healthy foods. This involves:  Eating 5 or more servings of fruits and vegetables a day.  Making dietary changes that address high blood pressure (hypertension), high cholesterol, diabetes, or obesity.  Manage your cholesterol levels.  Making food choices that are high in fiber and low in saturated fat, trans fat, and cholesterol may control cholesterol levels.  Take any prescribed medicines to control cholesterol as directed by your health care provider.  Manage your diabetes.  Controlling your carbohydrate and sugar intake is recommended to manage diabetes.  Take any prescribed medicines to control diabetes as directed by your health care provider.  Control your hypertension.  Making food choices that are low in salt (sodium), saturated fat, trans fat, and cholesterol is recommended to manage hypertension.  Take any prescribed medicines to control hypertension as directed by your health care provider.  Maintain a healthy weight.  Reducing calorie intake and making food choices that are low in sodium, saturated fat, trans fat, and cholesterol are recommended to manage weight.  Stop drug abuse.  Avoid taking birth control pills.  Talk to your health care provider about the risks of taking birth control pills if you are over 39 years old, smoke, get migraines, or have ever  had a blood clot.  Get evaluated for sleep disorders (sleep apnea).  Talk to your health care provider about getting a sleep evaluation if you snore a lot or have excessive sleepiness.  Take medicines only as directed by your health care provider.  For some people, aspirin or blood thinners (anticoagulants) are helpful in reducing the risk of forming abnormal blood clots that can lead to stroke. If you have the irregular heart rhythm of atrial fibrillation, you should be on a blood  thinner unless there is a good reason you cannot take them.  Understand all your medicine instructions.  Make sure that other conditions (such as anemia or atherosclerosis) are addressed. SEEK IMMEDIATE MEDICAL CARE IF:   You have sudden weakness or numbness of the face, arm, or leg, especially on one side of the body.  Your face or eyelid droops to one side.  You have sudden confusion.  You have trouble speaking (aphasia) or understanding.  You have sudden trouble seeing in one or both eyes.  You have sudden trouble walking.  You have dizziness.  You have a loss of balance or coordination.  You have a sudden, severe headache with no known cause.  You have new chest pain or an irregular heartbeat. Any of these symptoms may represent a serious problem that is an emergency. Do not wait to see if the symptoms will go away. Get medical help at once. Call your local emergency services (911 in [place] Do not drive yourself to the hospital. Document Released: 04/06/2004 Document Revised: 07/14/2013 Document Reviewed: 08/30/2012 [facility] Patient Information 2015 [place], [place]. This information is not intended to replace advice given to you by your health care provider. Make sure you discuss any questions you have with your health care provider. Smoking Cessation Quitting smoking is important to your health and has many advantages. However, it is not always easy to quit since nicotine is a very addictive drug. Oftentimes, people try 3 times or more before being able to quit. This document explains the best ways for you to prepare to quit smoking. Quitting takes hard work and a lot of effort, but you can do it. ADVANTAGES OF QUITTING SMOKING  You will live longer, feel better, and live better.  Your body will feel the impact of quitting smoking almost immediately.  Within 20 minutes, blood pressure decreases. Your pulse returns to its normal level.  After 8 hours, carbon monoxide levels  in the blood return to normal. Your oxygen level increases.  After 24 hours, the chance of having a heart attack starts to decrease. Your breath, hair, and body stop smelling like smoke.  After 48 hours, damaged nerve endings begin to recover. Your sense of taste and smell improve.  After 72 hours, the body is virtually free of nicotine. Your bronchial tubes relax and breathing becomes easier.  After 2 to 12 weeks, lungs can hold more air. Exercise becomes easier and circulation improves.  The risk of having a heart attack, stroke, cancer, or lung disease is greatly reduced.  After 1 year, the risk of coronary heart disease is cut in half.  After 5 years, the risk of stroke falls to the same as a nonsmoker.  After 10 years, the risk of lung cancer is cut in half and the risk of other cancers decreases significantly.  After 15 years, the risk of coronary heart disease drops, usually to the level of a nonsmoker.  If you are pregnant, quitting smoking will improve your chances of  having a healthy baby.  The people you live with, especially any children, will be healthier.  You will have extra money to spend on things other than cigarettes. QUESTIONS TO THINK ABOUT BEFORE ATTEMPTING TO QUIT You may want to talk about your answers with your health care provider.  Why do you want to quit?  If you tried to quit in the past, what helped and what did not?  What will be the most difficult situations for you after you quit? How will you plan to handle them?  Who can help you through the tough times? Your family? Friends? A health care provider?  What pleasures do you get from smoking? What ways can you still get pleasure if you quit? Here are some questions to ask your health care provider:  How can you help me to be successful at quitting?  What medicine do you think would be best for me and how should I take it?  What should I do if I need more help?  What is smoking withdrawal  like? How can I get information on withdrawal? GET READY  Set a quit date.  Change your environment by getting rid of all cigarettes, ashtrays, matches, and lighters in your home, car, or work. Do not let people smoke in your home.  Review your past attempts to quit. Think about what worked and what did not. GET SUPPORT AND ENCOURAGEMENT You have a better chance of being successful if you have help. You can get support in many ways.  Tell your family, friends, and coworkers that you are going to quit and need their support. Ask them not to smoke around you.  Get individual, group, or telephone counseling and support. Programs are available at [employer] and health centers. Call your local health department for information about programs in your area.  Spiritual beliefs and practices may help some smokers quit.  Download a "quit meter" on your computer to keep track of quit statistics, such as how long you have gone without smoking, cigarettes not smoked, and money saved.  Get a self-help book about quitting smoking and staying off tobacco. [place] yourself from urges to smoke. Talk to someone, go for a walk, or occupy your time with a task.  Change your normal routine. Take a different route to work. Drink tea instead of coffee. Eat breakfast in a different place.  Reduce your stress. Take a hot bath, exercise, or read a book.  Plan something enjoyable to do every day. Reward yourself for not smoking.  Explore interactive web-based programs that specialize in helping you quit. GET MEDICINE AND USE IT CORRECTLY Medicines can help you stop smoking and decrease the urge to smoke. Combining medicine with the above behavioral methods and support can greatly increase your chances of successfully quitting smoking.  Nicotine replacement therapy helps deliver nicotine to your body without the negative effects and risks of smoking. Nicotine replacement  therapy includes nicotine gum, lozenges, inhalers, nasal sprays, and skin patches. Some may be available over-the-counter and others require a prescription.  Antidepressant medicine helps people abstain from smoking, but how this works is unknown. This medicine is available by prescription.  Nicotinic receptor partial agonist medicine simulates the effect of nicotine in your brain. This medicine is available by prescription. Ask your health care provider for advice about which medicines to use and how to use them based on your health history. Your health care provider will tell you what side  effects to look out for if you choose to be on a medicine or therapy. Carefully read the information on the package. Do not use any other product containing nicotine while using a nicotine replacement product.  RELAPSE OR DIFFICULT SITUATIONS Most relapses occur within the first 3 months after quitting. Do not be discouraged if you start smoking again. Remember, most people try several times before finally quitting. You may have symptoms of withdrawal because your body is used to nicotine. You may crave cigarettes, be irritable, feel very hungry, cough often, get headaches, or have difficulty concentrating. The withdrawal symptoms are only temporary. They are strongest when you first quit, but they will go away within 10-14 days. To reduce the chances of relapse, try to:  Avoid drinking alcohol. Drinking lowers your chances of successfully quitting.  Reduce the amount of caffeine you consume. Once you quit smoking, the amount of caffeine in your body increases and can give you symptoms, such as a rapid heartbeat, sweating, and anxiety.  Avoid smokers because they can make you want to smoke.  Do not let weight gain distract you. Many smokers will gain weight when they quit, usually less than 10 pounds. Eat a healthy diet and stay active. You can always lose the weight gained after you quit.  Find ways to improve  your mood other than smoking. FOR MORE INFORMATION  www.smokefree.gov  Document Released: 02/21/2001 Document Revised: 07/14/2013 Document Reviewed: 06/08/2011 [facility] Patient Information 2015 [place], [place]. This information is not intended to replace advice given to you by your health care provider. Make sure you discuss any questions you have with your health care provider.

## 2013-11-19 NOTE — Progress Notes (Signed)
Pt's c.diff sample has resulted, and it is negative. Teaching service paged for discharge orders.

## 2013-11-19 NOTE — Discharge Summary (Signed)
INTERNAL MEDICINE ATTENDING DISCHARGE COSIGN   I evaluated the patient on the day of discharge and discussed the discharge plan with my resident team. I agree with the discharge documentation and disposition.   Zeah Germano 11/19/2013, 3:29 PM

## 2013-11-19 NOTE — Progress Notes (Signed)
SLP Cancellation Note  Patient Details Name: Oscar Greene MRN: [id number] DOB: 03/27/1950   Cancelled treatment:        Patient preparing for d/c home.  All symptoms resolved within 30 minutes, per MD notes.  Speech and cognition have apparently returned to baseline.  Will defer Speech evaluation at this time.   Quinn Axe T 11/19/2013, 3:25 PM

## 2013-11-19 NOTE — Progress Notes (Signed)
Subjective: Oscar Greene feels well this morning. He denies any weakness, numbness or any other neurologic symptoms. He does report 4 episodes of diarrhea overnight (remains watery, non-bloody).   States that the rash on his leg is no longer itching.'  Patient shared today that his last cocaine use was 3 weeks ago. He verbalizes understanding that both his cocaine use and his smoking greatly increase the risk of recurrent stroke. Discussion of diet also initiated.  Would like to go home.   Objective: Vital signs in last 24 hours: Filed Vitals:   11/18/13 1844 11/18/13 2145 11/19/13 0120 11/19/13 0537  BP: 132/71 100/74 140/72 125/80  Pulse: 86 77 73 65  Temp: 98 F (36.7 C) 97.4 F (36.3 C) 98.2 F (36.8 C) 98.2 F (36.8 C)  TempSrc: Oral Oral Oral Oral  Resp: 18 18 18 18   Height:      Weight:      SpO2: 100% 100% 98% 100%   Weight change:   Intake/Output Summary (Last 24 hours) at 11/19/13 0845 Last data filed at 11/18/13 1700  Gross per 24 hour  Intake    620 ml  Output      0 ml  Net    620 ml   Physical Exam: Appearance: lying in bed in NAD, watching TV, sister at bedside HEENT: left frontal scar and depression from prior surgery, otherwise AT/[place], PERRL, EOMi, no lymphadenopathy  Neurologic: A&Ox3, patient has some mumbling speech without slurring (family confirmed on admission that this was his baseline), CN II-XII intact, strength and sensation 5/5 throughout, coordination intact (FNF and heel-to-shin), two-point discrimination intact, normal gait Heart: RRR, normal S1S2, no MRG, no carotid bruits  Lungs: CTAB, no WRR  Abdomen: thin, BS+, soft, nontender  Extremities: no edema  Skin: area of hyperpigmented skin on shin of left lower extremity, 5"x3", thickened, cream is being applied but no change in appearance since admission  Lab Results: Basic Metabolic Panel:  Recent Labs Lab 11/18/13 0002 11/19/13 0345  NA 135* 139  K 3.4* 3.9  CL 99 105  CO2 23 23   GLUCOSE 115* 103*  BUN 20 15  CREATININE 1.23 1.07  CALCIUM 8.1* 8.3*   Liver Function Tests:  Recent Labs Lab 11/17/13 1208  AST 21  ALT 13  ALKPHOS 61  BILITOT 0.8  PROT 6.8  ALBUMIN 3.7   CBC:  Recent Labs Lab 11/17/13 1208  WBC 7.8  NEUTROABS 6.5  HGB 13.4  HCT 39.8  MCV 94.5  PLT 127*   Cardiac Enzymes:  Recent Labs Lab 11/17/13 1809 11/18/13 0002 11/18/13 0800  TROPONINI <0.30 <0.30 <0.30   Fasting Lipid Panel:  Recent Labs Lab 11/18/13 0002  CHOL 94  HDL 38*  LDLCALC 39  TRIG 86  CHOLHDL 2.5   Coagulation:  Recent Labs Lab 11/17/13 1208  LABPROT 14.9  INR 1.17   Urine Drug Screen: Drugs of Abuse     Component Value Date/Time   LABOPIA NONE DETECTED 11/17/2013 1356   COCAINSCRNUR POSITIVE* 11/17/2013 1356   LABBENZ NONE DETECTED 11/17/2013 1356   AMPHETMU NONE DETECTED 11/17/2013 1356   THCU NONE DETECTED 11/17/2013 1356   LABBARB NONE DETECTED 11/17/2013 1356    Alcohol Level:  Recent Labs Lab 11/17/13 1208  ETH <11   Urinalysis:  Recent Labs Lab 11/17/13 1356  COLORURINE AMBER*  LABSPEC 1.026  PHURINE 5.5  GLUCOSEU NEGATIVE  HGBUR SMALL*  BILIRUBINUR NEGATIVE  KETONESUR NEGATIVE  PROTEINUR 100*  UROBILINOGEN 0.2  NITRITE  NEGATIVE  LEUKOCYTESUR NEGATIVE    Micro Results: Recent Results (from the past 240 hour(s))  URINE CULTURE     Status: None   Collection Time    11/17/13  1:56 PM      Result Value Ref Range Status   Specimen Description URINE, RANDOM   Final   Special Requests ADDED [id number]   Final   Culture  Setup Time     Final   Value: 11/17/2013 20:19     Performed at [facility]     Final   Value: NO GROWTH     Performed at [facility]   Culture     Final   Value: NO GROWTH     Performed at [facility]   Report Status 11/19/2013 FINAL   Final  MRSA PCR SCREENING     Status: None   Collection Time    11/17/13  7:27 PM      Result Value Ref Range Status    MRSA by PCR NEGATIVE  NEGATIVE Final   Comment:            The GeneXpert MRSA Assay (FDA     approved for NASAL specimens     only), is one component of a     comprehensive MRSA colonization     surveillance program. It is not     intended to diagnose MRSA     infection nor to guide or     monitor treatment for     MRSA infections.   Studies/Results: Dg Chest 2 View  11/17/2013   CLINICAL DATA:  Weakness. Left-sided paralysis which subsequently resolved. Illness for several days.  EXAM: CHEST  2 VIEW  COMPARISON:  None.  FINDINGS: Cardiothoracic index 52% compatible with mild enlargement. No edema. Mildly tortuous thoracic aorta.  The lungs appear clear.  IMPRESSION: 1. Mildly enlarged cardiopericardial silhouette. Otherwise, no significant abnormalities are observed.   Electronically Signed   By: Sherryl Barters M.D.   On: 11/17/2013 12:24   Ct Head Wo Contrast  11/17/2013   CLINICAL DATA:  Felt weak while working outside. Episode of left-sided paralysis. Symptoms have resolved. 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  EXAM: CT HEAD WITHOUT CONTRAST  TECHNIQUE: Contiguous axial images were obtained from the base of the skull through the vertex without intravenous contrast.  COMPARISON:  None.  FINDINGS: Remote left frontal craniotomy. Underlying encephalomalacia. Findings consistent with patient's remote injury.  No intracranial hemorrhage.  Right caudate head and mid to anterior right centrum semiovale hypodensity have an appearance more suggestive of remote infarction rather than acute infarct.  No CT evidence of large acute infarct.  Calcification anterior right frontal lobe may reflect result prior trauma or infection. No surrounding mass identified on this unenhanced exam.  No hydrocephalus.  IMPRESSION: Remote left frontal craniotomy. Underlying encephalomalacia. Findings consistent with patient's remote injury.  No intracranial hemorrhage.  Right caudate head and mid to  anterior right centrum semiovale hypodensity have an appearance more suggestive of remote infarction rather than acute infarct.  No CT evidence of large acute infarct.  Calcification anterior right frontal lobe may reflect result prior trauma or infection.   Electronically Signed   By: Chauncey Cruel M.D.   On: 11/17/2013 11:56   Mr Brain Wo Contrast  11/17/2013   CLINICAL DATA:  Transient episode of left-sided weakness. Evaluate for stroke.  EXAM: MRI HEAD WITHOUT CONTRAST  MRA HEAD WITHOUT  CONTRAST  TECHNIQUE: Multiplanar, multiecho pulse sequences of the brain and surrounding structures were obtained without intravenous contrast. Angiographic images of the head were obtained using MRA technique without contrast.  COMPARISON:  Head CT 11/17/2013  FINDINGS: MRI HEAD FINDINGS  Multiple punctate foci of restricted diffusion are present in the right frontal lobe involving cortex and subcortical white matter in the MCA territory. There is no evidence of intracranial hemorrhage, mass, midline shift, or extra-axial fluid collection. A small calcification is again seen in the right frontal lobe. Sequelae of prior left frontal craniotomy are again identified with underlying left frontal lobe encephalomalacia. Scattered foci of T2 hyperintensity in the subcortical and deep cerebral white matter are nonspecific but compatible with mild chronic small vessel ischemic disease. There is mild generalized cerebral atrophy. An old lacunar infarct is noted in the right caudate.  Orbits are unremarkable. Mild right maxillary sinus mucosal thickening is noted. Mastoid air cells are clear. Abnormal appearance of the intracranial right ICA flow void is consistent with proximal occlusion in the neck. Mild upper cervical spondylosis is partially visualized with marrow changes at C4-5 favored to be degenerative.  MRA HEAD FINDINGS  The visualized distal vertebral arteries are patent and codominant. PICA origins are patent. AICA and SCA  origins are patent. Basilar artery is patent without stenosis. PCAs are unremarkable. Posterior communicating arteries are not clearly identified.  Lack of flow related enhancement in the imaged, distal cervical right ICA and majority of the intracranial right ICA is consistent with proximal occlusion in the neck. Minimal flow related enhancement in the right supraclinoid ICA may reflect ECA collateral flow via the ophthalmic artery. The right M1 segment is patent without focal stenosis, however there is diminished flow within the M1 segment as well as in MCA branch vessels compared to the left. The number of right MCA distal branch vessels also appears diminished compared to the left. There is minimal flow related enhancement in the right A1 segment, with the right A2 segment presumably receiving the majority of its supply via the anterior communicating artery, although this is not well seen.  Left internal carotid artery is patent from skullbase to carotid terminus without stenosis. Left MCA and left ACA are unremarkable. No intracranial aneurysm is identified.  IMPRESSION: 1. Multiple foci of punctate acute infarction in the right frontal lobe. 2. 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 3. Left frontal lobe encephalomalacia at site of prior craniotomy. 4. 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 Distal reconstitution of the carotid terminus, likely via external carotid flow. Proximal right MCA is patent, although with diminished flow.   Electronically Signed   By: [facility] Bores   On: 11/17/2013 17:04   Mr Jodene Nam Head/brain Wo Cm  11/17/2013   CLINICAL DATA:  Transient episode of left-sided weakness. Evaluate for stroke.  EXAM: MRI HEAD WITHOUT CONTRAST  MRA HEAD WITHOUT CONTRAST  TECHNIQUE: Multiplanar, multiecho pulse sequences of the brain and surrounding structures were obtained without intravenous contrast. Angiographic images of the head were  obtained using MRA technique without contrast.  COMPARISON:  Head CT 11/17/2013  FINDINGS: MRI HEAD FINDINGS  Multiple punctate foci of restricted diffusion are present in the right frontal lobe involving cortex and subcortical white matter in the MCA territory. There is no evidence of intracranial hemorrhage, mass, midline shift, or extra-axial fluid collection. A small calcification is again seen in the right frontal lobe. Sequelae of prior left frontal craniotomy  are again identified with underlying left frontal lobe encephalomalacia. Scattered foci of T2 hyperintensity in the subcortical and deep cerebral white matter are nonspecific but compatible with mild chronic small vessel ischemic disease. There is mild generalized cerebral atrophy. An old lacunar infarct is noted in the right caudate.  Orbits are unremarkable. Mild right maxillary sinus mucosal thickening is noted. Mastoid air cells are clear. Abnormal appearance of the intracranial right ICA flow void is consistent with proximal occlusion in the neck. Mild upper cervical spondylosis is partially visualized with marrow changes at C4-5 favored to be degenerative.  MRA HEAD FINDINGS  The visualized distal vertebral arteries are patent and codominant. PICA origins are patent. AICA and SCA origins are patent. Basilar artery is patent without stenosis. PCAs are unremarkable. Posterior communicating arteries are not clearly identified.  Lack of flow related enhancement in the imaged, distal cervical right ICA and majority of the intracranial right ICA is consistent with proximal occlusion in the neck. Minimal flow related enhancement in the right supraclinoid ICA may reflect ECA collateral flow via the ophthalmic artery. The right M1 segment is patent without focal stenosis, however there is diminished flow within the M1 segment as well as in MCA branch vessels compared to the left. The number of right MCA distal branch vessels also appears diminished compared  to the left. There is minimal flow related enhancement in the right A1 segment, with the right A2 segment presumably receiving the majority of its supply via the anterior communicating artery, although this is not well seen.  Left internal carotid artery is patent from skullbase to carotid terminus without stenosis. Left MCA and left ACA are unremarkable. No intracranial aneurysm is identified.  IMPRESSION: 1. Multiple foci of punctate acute infarction in the right frontal lobe. 2. 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 3. Left frontal lobe encephalomalacia at site of prior craniotomy. 4. 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 Distal reconstitution of the carotid terminus, likely via external carotid flow. Proximal right MCA is patent, although with diminished flow.   Electronically Signed   By: [facility] Bores   On: 11/17/2013 17:04   Echo: 10/18/13: no cardiac source of emboli was identified  Carotid dopplers 10/18/13: right occluded ICA left 1-39% ICA stenosis.   Medications: I have reviewed the patient's current medications. Scheduled Meds: . aspirin  324 mg Oral Daily  . clopidogrel  75 mg Oral Daily  . heparin  5,000 Units Subcutaneous 3 times per day  . terbinafine   Topical BID   Continuous Infusions: . sodium chloride 75 mL/hr at 11/17/13 1728   PRN Meds:.acetaminophen, polyvinyl alcohol, senna-docusate Assessment/Plan: Active Problems:   Cerebral thrombosis with cerebral infarction   Renal insufficiency   Cocaine use   Hypertension   Glaucoma   Fungal skin infection   CVA (cerebral infarction)  Oscar Greene is a 63 yo smoker with hypertension and a family history of CVA who has been found to have multiple foci of right frontal lobe punctate infarction and an occluded right ICA. His cocaine screen was also positive. His symptoms, however, had fully resolved by the time he got to the ED on the day of admission.  CVA:  patient was found to have multiple foci of punctate acute infarction in the right frontal lobe along with occlusion of the right ICA on MRI/MRA. His echo did not visualize any cardiac sources of emboli; carotid doppler study revealed occlusion of the  right ICA and <39% stenosis of the left ICA. The remainder of his stroke workup involves extensive lifestyle risk modulation counseling.  - HgbA1c came back 5% - PT, OT signed off with no need for further therapy - risk factor modification counseling  - asa 81 and plavix 75 as per neurology - follow up with Dr. Erlinda Hong in stroke clinic in 2 months  Cocaine and Tobacco Abuse:  - provided counseling, education   Diarrhea: patient had diarrhea overnight (4-5 episodes) with negative c. diff - f/u stool pathogen panel  AKI (creatinine 1.6-->1.23-->1.07 with no baseline): Likely prerenal secondary to diarrhea - borderline UTI, f/u urine culture   Skin Lesion on RLE: likely fungal  - continue terbinafine cream   DVT Ppx:  - [place] heparin   Diet: heart healthy  Dispo: Disposition is deferred at this time, awaiting improvement of current medical problems.  Anticipated discharge in approximately 0 day(s).   The patient does have a current PCP Orlena Sheldon, PA-C) and does not need an [facility] hospital follow-up appointment after discharge.  The patient does not have transportation limitations that hinder transportation to clinic appointments.  .Services Needed at time of discharge: Y = Yes, Blank = No PT:   OT:   RN:   Equipment:   Other:     LOS: 2 days   Drucilla Schmidt, MD 11/19/2013, 8:45 AM

## 2013-11-19 NOTE — Progress Notes (Signed)
Discharge orders received. Pt for discharge home today. IV d/c'd. Pt given discharge instructions with verbalized understanding. Family notified of pt's discharge orders. Pt going home with a friend. Staff brought pt downstairs via wheelchair.

## 2013-11-19 NOTE — Progress Notes (Signed)
Pt seen and examined with Dr. Sherrine Maples. Please refer to resident note for details   Pt feels well today. Had 3-4 episodes of diarrhea since this AM. No fevers  Exam:  Cardio: RRR, normal heart sounds  Lungs: CTA b/l  Abd: soft, non tender, non distended, BS +  Gen: AAO*3, NAD  Ext- no edema   Assessment and Plan:  63 y/o male with Acute CVA, diarrhea   CVA:  - Neuro f/u noted. Will need outpatient appointment with Dr. Erlinda Hong in ths troke clinic in 2 months  - MRI with multiple foci of punctate infarction R frontal lobe  - Carotid doppler with occluded R ICA  - 2 D ECHO with no embolic source - c/w asa, plavix  Diarrhea:  - Pt with persistent diarrhea but improving - C diff negative - f/u stool pathogen panel   AKI:  - Now resolved. Likely prerenal secondary to diarrhea  - d/c IVF  Stable for d/c home today.

## 2013-11-19 NOTE — Clinical Documentation Improvement (Signed)
Possible Clinical Conditions?  Acute Renal Failure/Acute Kidney Injury Acute Tubular Necrosis Acute on Chronic Renal Failure Chronic Renal Failure Other Condition Cannot Clinically Determine   Supporting Information: Risk Factors: "CVA" Diagnostics: LABS: Component     Latest Ref Rng 11/17/2013 11/18/2013 11/19/2013  Sodium     137 - 147 mEq/L 135 (L) 135 (L) 139  Potassium     3.7 - 5.3 mEq/L 4.0 3.4 (L) 3.9  Chloride     96 - 112 mEq/L 99 99 105  CO2     19 - 32 mEq/L 25 23 23   Glucose     70 - 99 mg/dL 117 (H) 115 (H) 103 (H)  BUN     6 - 23 mg/dL 22 20 15   Creatinine     0.50 - 1.35 mg/dL 1.61 (H) 1.23 1.07  Calcium     8.4 - 10.5 mg/dL 8.6 8.1 (L) 8.3 (L)  GFR calc non Af Amer     >90 mL/min 44 (L) 60 (L) 71 (L)  GFR calc Af Amer     >90 mL/min 51 (L) 70 (L) 83 (L)  Anion gap     5 - 15 11 13 11     Thank You, Alessandra Grout, RN, BSN, CCDS,Clinical Documentation Specialist:  [phone]  [phone]=[employer] [place]- Health Information Management

## 2013-11-19 NOTE — Progress Notes (Signed)
STROKE TEAM PROGRESS NOTE   HISTORY Oscar Greene is an 63 y.o. male who was at work in a tobacco field and this morning [phone] when at 9 AM he noticed sudden onset of dizziness as well as left-sided weakness. He had trouble moving his left hand and had decreased sensation. His symptoms lasted only about 30 minutes and started recovering at the time he came to the hospital he had completely resolved. He denied any complaint headache, slurred speech, double vision. He has no known prior history of strokes or TIAs. He has not seen a physician for years. He has had glaucoma diagnosed in recent years and has some arthritis for which he takes Motrin twice daily. He has a remote history of a left frontal craniotomy for a blood clot evacuation with no residual neurological deficits or history of seizures  Patient was not administered TPA secondary to Complete resolution of deficits. He was admitted for further evaluation and treatment.   SUBJECTIVE (INTERVAL HISTORY) Patient reports diarrhea decreasing. Is hoping to go home soon.   OBJECTIVE Temp:  [97.2 F (36.2 C)-98.3 F (36.8 C)] 97.2 F (36.2 C) (09/09 1049) Pulse Rate:  [65-86] 65 (09/09 1049) Cardiac Rhythm:  [-] Normal sinus rhythm (09/08 2008) Resp:  [18-19] 19 (09/09 1049) BP: (100-140)/(58-80) 107/76 mmHg (09/09 1049) SpO2:  [98 %-100 %] 99 % (09/09 1049)   Recent Labs Lab 11/17/13 1208 11/18/13 0002 11/19/13 0345  NA 135* 135* 139  K 4.0 3.4* 3.9  CL 99 99 105  CO2 25 23 23   GLUCOSE 117* 115* 103*  BUN 22 20 15   CREATININE 1.61* 1.23 1.07  CALCIUM 8.6 8.1* 8.3*    Recent Labs Lab 11/17/13 1208  AST 21  ALT 13  ALKPHOS 61  BILITOT 0.8  PROT 6.8  ALBUMIN 3.7    Recent Labs Lab 11/17/13 1208  WBC 7.8  NEUTROABS 6.5  HGB 13.4  HCT 39.8  MCV 94.5  PLT 127*    Recent Labs Lab 11/17/13 1809 11/18/13 0002 11/18/13 0800  TROPONINI <0.30 <0.30 <0.30    Recent Labs  11/17/13 1208  LABPROT 14.9   INR 1.17    Recent Labs  11/17/13 1356  COLORURINE AMBER*  LABSPEC 1.026  PHURINE 5.5  GLUCOSEU NEGATIVE  HGBUR SMALL*  BILIRUBINUR NEGATIVE  KETONESUR NEGATIVE  PROTEINUR 100*  UROBILINOGEN 0.2  NITRITE NEGATIVE  LEUKOCYTESUR NEGATIVE       Component Value Date/Time   CHOL 94 11/18/2013 0002   TRIG 86 11/18/2013 0002   HDL 38* 11/18/2013 0002   CHOLHDL 2.5 11/18/2013 0002   VLDL 17 11/18/2013 0002   LDLCALC 39 11/18/2013 0002   Lab Results  Component Value Date   HGBA1C 5.0 11/18/2013      Component Value Date/Time   LABOPIA NONE DETECTED 11/17/2013 1356   COCAINSCRNUR POSITIVE* 11/17/2013 1356   LABBENZ NONE DETECTED 11/17/2013 1356   AMPHETMU NONE DETECTED 11/17/2013 1356   THCU NONE DETECTED 11/17/2013 1356   LABBARB NONE DETECTED 11/17/2013 1356     Recent Labs Lab 11/17/13 [place] <11    Dg Chest 2 View 11/17/2013   1. Mildly enlarged cardiopericardial silhouette. Otherwise, no significant abnormalities are observed.     Ct Head Wo Contrast 11/17/2013    Remote left frontal craniotomy. Underlying encephalomalacia. Findings consistent with patient's remote injury.  No intracranial hemorrhage.  Right caudate head and mid to anterior right centrum semiovale hypodensity have an appearance more suggestive of remote infarction rather  than acute infarct.  No CT evidence of large acute infarct.  Calcification anterior right frontal lobe may reflect result prior trauma or infection.     MRI & MRA Head/brain Wo Cm 11/17/2013   1. Multiple foci of punctate acute infarction in the right frontal lobe. 2. 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 3. Left frontal lobe encephalomalacia at site of prior craniotomy. 4. 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 Distal reconstitution of the carotid terminus, likely via external carotid flow. Proximal right MCA is patent, although with diminished flow.     2D Echocardiogram  EF 50-55% with  no source of embolus.   Carotid Doppler  R ICA occlusion, No evidence of hemodynamically significant L internal carotid artery stenosis. Vertebral artery flow is antegrade.    PHYSICAL EXAM Pleasant middle aged african [place] male not in distress.Awake alert. Afebrile. Head is nontraumatic. Neck is supple without bruit. Hearing is normal. Cardiac exam no murmur or gallop. Lungs are clear to auscultation. Distal pulses are well felt. Neurological Exam ;  Awake  Alert oriented x 3. Normal speech and language.eye movements full without nystagmus.fundi were not visualized. Vision acuity and fields appear normal. Hearing is normal. Palatal movements are normal. Face symmetric. Tongue midline. Normal strength, tone, reflexes and coordination. Normal sensation. Gait deferred.   ASSESSMENT/PLAN [name] is a 63 y.o. male with a hx of glaucoma, arthritis and old left frontal craniotomy for blood clot evacuation without residual neuro deficits or seizures presenting with transient left sided weakness and dizziness. He did not receive IV t-PA due to complete resolution of symptoms. Imaging confirms multiple punctate right frontal lobe infarcts. He was considered for the SOCRATES study, but opted not to participate. Stroke work up completed.  Stroke:  Right multifocal punctate frontal infarcts, likely embolic secondary to proximal R ICA occlusion     no antithrombotic prior to admission, now on aspirin 324 mg chewable daily  MRI  Multifocal punctate R frontal infarcts, old R caudate lacune  MRA  R ICA occlusion in neck, R MCA w/ diminished flow  Carotid Doppler  R ICA occlusion  2D Echo  No source of emboli  LDL 39, no statin necessary as meeting goal of LDL <100  HgbA1c 5.0  Cocaine positive, counseled to stop cocaine use  Heparin 5000 units sq tid for VTE prophylaxis  Therapy needs:  none  Ongoing aggressive risk factor management  Patient counseled to be compliant with  his antithrombotic medications  Disposition:  home  Hypertension   Home meds:  None BP 100-140/72-76 past 24h (11/19/2013 @ 12:04 PM)   Stable  Other Stroke Risk Factors Cocaine positive this admission Cigarette smoker, advised to stop smoking   Family hx stroke (father)  High fat, red meat diet  Other Active Problems  Watery stool, c diff sent. Pt improving.  Other Pertinent History  Has not seen a MD in years  Hospital day # 2  No further stroke workup indicated.  Patient has a 10-15% risk of having another stroke over the next year, the highest risk is within 2 weeks of the most recent stroke/TIA (risk of having a stroke following a stroke or TIA is the same).  Ongoing risk factor control by Primary Care Physician  Stroke Service will sign off. Please call should any needs arise.  Follow up with Dr. Erlinda Hong, [facility], in 2 months.   SHARON BIBY, MSN, RN, ANVP-BC,  ANP-BC, GNP-BC [facility] Pager: [phone] 11/19/2013 12:04 PM  I have personally examined this patient, reviewed notes, independently viewed imaging studies, participated in medical decision making and plan of care. I have made any additions or clarifications directly to the above note. Agree with note above.   Antony Contras, MD Medical Director [facility] Pager: [phone] 11/19/2013 1:47 PM    To contact Stroke Continuity provider, please refer to [email or web address] After hours, contact General Neurology

## 2013-11-26 ENCOUNTER — Encounter: Payer: Self-pay | Admitting: Physician Assistant

## 2013-11-26 ENCOUNTER — Ambulatory Visit (INDEPENDENT_AMBULATORY_CARE_PROVIDER_SITE_OTHER): Payer: BC Managed Care – PPO | Admitting: Physician Assistant

## 2013-11-26 VITALS — BP 145/60 | HR 56 | Temp 97.0°F | Ht 72.0 in | Wt 156.0 lb

## 2013-11-26 DIAGNOSIS — H409 Unspecified glaucoma: Secondary | ICD-10-CM

## 2013-11-26 DIAGNOSIS — N289 Disorder of kidney and ureter, unspecified: Secondary | ICD-10-CM

## 2013-11-26 DIAGNOSIS — F1490 Cocaine use, unspecified, uncomplicated: Secondary | ICD-10-CM

## 2013-11-26 DIAGNOSIS — F1410 Cocaine abuse, uncomplicated: Secondary | ICD-10-CM

## 2013-11-26 DIAGNOSIS — I6330 Cerebral infarction due to thrombosis of unspecified cerebral artery: Secondary | ICD-10-CM

## 2013-11-26 DIAGNOSIS — F17200 Nicotine dependence, unspecified, uncomplicated: Secondary | ICD-10-CM | POA: Insufficient documentation

## 2013-11-26 DIAGNOSIS — I63239 Cerebral infarction due to unspecified occlusion or stenosis of unspecified carotid arteries: Secondary | ICD-10-CM

## 2013-11-26 DIAGNOSIS — I63031 Cerebral infarction due to thrombosis of right carotid artery: Secondary | ICD-10-CM

## 2013-11-26 NOTE — Progress Notes (Signed)
Patient ID: Oscar Greene MRN: [id number], DOB: 1950-06-10, 63 y.o. Date of Encounter: @DATE @  Chief Complaint:  Chief Complaint  Patient presents with  . new patient    per patient-  establish as a patient and followup on recent hospitalization  for CVA.    HPI: 63 y.o. year old AA male  presents as a new patient to establish care with practice.  He says that he has never had medical insurance until he just started [employer] coverage September 1. He had gone through the process of getting medical insurance and had scheduled an office visit to establish care with our office. Because he did not have medical insurance, he says that he never went to any type of doctors office and never received any medical care. However he recently had unexpected hospitalization.  The following is copied from the admission H&P.   The following is copied from the admission H&P.  Chief Complaint: left-sided weakness  History of Present Illness:  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 He remained weak when EMS arrived; his symptoms then entirely resolved.  Of note, he has a one day history of pain and pressure behind both of his eyes; this was accompanied by some photophobia and vision that was more blurred than usual out of both eyes. He also had decreased appetite and 7-8 episodes of diarrhea yesterday with 7-8 more overnight. He denies chest pain, palpitations, shortness of breath, residual numbness, tingling or weakness,  confusion, changes in hearing or any other symptoms.  He has never experienced anything similar to this event. He does have a family history of CVA in his father (who had multiple strokes that ultimately led to his death). His brother and nephew state that he is back to his baseline cognition and speech. He has a 40 pack year history of smoking, admits to a high-fat, primarily red meat diet, and does not take any medications.    A1C---5.0 CBC--Normal CMET--GFR 83--o/w normal FLP:   Triglycerides-- 86  HDL---   38  LDL---   39  TODAY--11/26/2013:  Today he reports that he is taking his medications as prescribed from the discharge summary. Today he reports that he has not smoked at all since the hospitalization!!! Visit he was smoking about a pack a day prior to the hospitalization. Today he tells me that the only other medical care he has had is -- that he recently went to see an eye doctor in July. Says he was told he had cataracts in both eyes and glaucoma in one eye. He told me that he was scheduled for laser surgery next Thursday. He voiced no other complaints or concerns.     Past Medical History  Diagnosis Date  . Cataract   . Glaucoma   . Osteoporosis   . Headache(784.0)    remote history of traumatic head injury 30 years ago to the left frontal area requiring craniotomy for blood clot evacuation  Home Meds: Outpatient Prescriptions Prior to  Visit  Medication Sig Dispense Refill  . aspirin EC 81 MG tablet Take 1 tablet (81 mg total) by mouth daily.  30 tablet  3  . clopidogrel (PLAVIX) 75 MG tablet Take 1 tablet (75 mg total) by mouth daily.  30 tablet  3  . hydroxypropyl methylcellulose / hypromellose (ISOPTO TEARS / GONIOVISC) 2.5 % ophthalmic solution Place 1 drop into both eyes 3 (three) times daily as needed for dry eyes.      . naproxen sodium (ANAPROX) 220 MG tablet Take 440 mg by mouth 2 (two) times daily as needed (for pain).      . nystatin cream (MYCOSTATIN)  Apply 1 application topically 2 (two) times daily.  30 g  0   No facility-administered medications prior to visit.    Allergies: No Known Allergies  History   Social History  . Marital Status: Single    Spouse Name: N/A    Number of Children: N/A  . Years of Education: N/A   Occupational History  . Not on file.   Social History Main Topics  . Smoking status: Current Every Day Smoker  . Smokeless tobacco: Never Used  . Alcohol Use: Not on file  . Drug Use: No  . Sexual Activity: Yes   Other Topics Concern  . Not on file   Social History Narrative  . No narrative on file    History reviewed. No pertinent family history.   Review of Systems:  See HPI for pertinent ROS. All other ROS negative.    Physical Exam: Blood pressure 145/60, pulse 56, temperature 97 F (36.1 C), temperature source Oral, height 6' (1.829 m), weight 156 lb (70.761 kg)., Body mass index is 21.15 kg/(m^2). General: THin AAM. Appears in no acute distress. Neck: Supple. No thyromegaly. No lymphadenopathy. I hear no carotid bruits. Lungs: Clear bilaterally to auscultation without wheezes, rales, or rhonchi. Breathing is unlabored. Heart: RRR with S1 S2. No murmurs, rubs, or gallops. Abdomen: Soft, non-tender, non-distended with normoactive bowel sounds. No hepatomegaly. No rebound/guarding. No obvious abdominal masses. Musculoskeletal:  Strength and tone normal for age. Extremities/Skin: Warm and dry. Neuro: Alert and oriented X 3. Moves all extremities spontaneously. Gait is abnormal--bowing of left leg. CNII-XII grossly in tact. Psych:  Responds to questions appropriately with a normal affect.     ASSESSMENT AND PLAN:  63 y.o. year old male with  1. Smoker Congrats on cessation!!  2. 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 Findings include: 1- multiple foci of punctate acute infarction in the right frontal lobe. 2-old lacunar  infarct in the right caudate and mild chronic small vessel ischemic disease. 3-left frontal lobe encephalomalacia at the site of prior craniotomy. 4-occlusion of the right internal carotid artery in the neck. Distal reconstitution of the carotid terminus, likely via external carotid flow. Proximal right MCA is patent, although with diminished flow.    A1C---5.0 CBC--Normal CMET--GFR 83--o/w normal FLP:   Triglycerides-- 86  HDL---   38  LDL---   39  3. Cocaine use Drug screen at admission was positive for cocaine.--Asked him about cocaine use he says that he does not use this on a regular basis and says that he had gone to a party a couple weeks ago.  4. Renal insufficiency GFR 83  5. Glaucoma Per patient  report.  6. Cerebral thrombosis with cerebral infarction  At the end of the visit, told patient that I recommended he follow schedule followup visit  with me for complete physical exam so that we could update preventive care. I then walked with him to the scheduling desk to discuss this with them. At that time, his sister came to the desk. She started asking me about medication for him to use for his "arthritis of his knee. " --I then saw that his left knee is bowing out a lot--- he says that this happened 40 years ago from playing basketball. Never had any evaluation of this at all. She started asking me about colonoscopy. I explained to her what we had gotten accomplished at today's visit and the fact that we are already planning for him to schedule followup office visit here to update his preventive care and manage his other medical problems. Also at that time, she says that the appointment scheduled for next Thursday is an appointment for him to see a new eye doctor. She says that no laser surgery has been scheduled.  Also at the end of the visit, prior to the above encounter with the sister, I asked patient if the hospital had scheduled him follow up with anybody else other  than here. He had replied no-- that his only followup was here. However, when I printed his AVS it has an appointment scheduled for him to follow up at [facility] as well. I discussed this with the patient and showed him where it is printed so he can make sure to followup with that appointment.   Signed, [address] [place], [place], [facility] 11/26/2013 10:52 AM

## 2013-12-12 ENCOUNTER — Encounter: Payer: BC Managed Care – PPO | Admitting: Family Medicine

## 2013-12-17 ENCOUNTER — Encounter: Payer: BC Managed Care – PPO | Admitting: Physician Assistant

## 2013-12-30 ENCOUNTER — Encounter: Payer: Self-pay | Admitting: Family Medicine

## 2013-12-30 ENCOUNTER — Ambulatory Visit (INDEPENDENT_AMBULATORY_CARE_PROVIDER_SITE_OTHER): Payer: BC Managed Care – PPO | Admitting: Family Medicine

## 2013-12-30 VITALS — BP 190/100 | HR 56 | Temp 97.8°F | Resp 16 | Ht 72.0 in | Wt 162.0 lb

## 2013-12-30 DIAGNOSIS — I10 Essential (primary) hypertension: Secondary | ICD-10-CM

## 2013-12-30 MED ORDER — CLONIDINE HCL 0.1 MG PO TABS: 0.1000 mg | ORAL_TABLET | Freq: Two times a day (BID) | ORAL | Status: DC

## 2013-12-30 NOTE — Progress Notes (Signed)
Subjective:    Patient ID: Oscar Greene, male    DOB: 13-Jun-1950, 63 y.o.   MRN: [id number]  HPI Patient presents today with severely elevated blood pressure 190/100. He was referred by his doctors blood pressure was found to be greater than 200/100 when he recently had surgery for glaucoma. I rechecked his blood pressure myself today and found to be 192/110. He denies any chest pain shortness of breath or dyspnea on exertion. He denies any use of stimulants, amphetamine, or cocaine. His blood pressure has been marginally elevated in the past in the 170s over 100s but never this high. He does admit to using excessive amounts of salt. He denies any headaches or blurry vision. Past Medical History  Diagnosis Date  . Cataract   . Glaucoma   . Osteoporosis   . Headache(784.0)   . History of traumatic head injury 03/14/1983    A pitching wedge hit him in left frontal area   Past Surgical History  Procedure Laterality Date  . L frontal craniotomy to remove blood clot     Current Outpatient Prescriptions on File Prior to Visit  Medication Sig Dispense Refill  . aspirin EC 81 MG tablet Take 1 tablet (81 mg total) by mouth daily.  30 tablet  3  . clopidogrel (PLAVIX) 75 MG tablet Take 1 tablet (75 mg total) by mouth daily.  30 tablet  3  . hydroxypropyl methylcellulose / hypromellose (ISOPTO TEARS / GONIOVISC) 2.5 % ophthalmic solution Place 1 drop into both eyes 3 (three) times daily as needed for dry eyes.      . naproxen sodium (ANAPROX) 220 MG tablet Take 440 mg by mouth 2 (two) times daily as needed (for pain).       No current facility-administered medications on file prior to visit.   No Known Allergies History   Social History  . Marital Status: Single    Spouse Name: N/A    Number of Children: N/A  . Years of Education: N/A   Occupational History  . Not on file.   Social History Main Topics  . Smoking status: Current Every Day Smoker  . Smokeless tobacco: Never Used  .  Alcohol Use: Not on file  . Drug Use: No  . Sexual Activity: Yes   Other Topics Concern  . Not on file   Social History Narrative   Patient states that he lives alone.   States that his sister lives very close to him  (distance he describes is approx distance of one city block) distance from him.   States that he also has a brother who lives very close to him--says that he lives about the same distance from him as the sister does.   He was smoking about one pack a day till he stopped at the time of his hospitalization 11/2013.   Drug screen was positive for cocaine at admission to hospital 11/2013. Patient reports at office visit 11/2013 he does not use cocaine on a routine basis and that he was " at a party 2 weeks ago"      Review of Systems  All other systems reviewed and are negative.      Objective:   Physical Exam  Vitals reviewed. Constitutional: He is oriented to person, place, and time.  Cardiovascular: Normal rate, regular rhythm and normal heart sounds.   Pulmonary/Chest: Effort normal and breath sounds normal. No respiratory distress. He has no wheezes. He has no rales.  Abdominal: Soft. Bowel sounds  are normal. He exhibits no distension. There is no tenderness. There is no rebound and no guarding.  Neurological: He is alert and oriented to person, place, and time. He has normal reflexes. He displays normal reflexes. No cranial nerve deficit. He exhibits normal muscle tone. Coordination normal.          Assessment & Plan:  Essential hypertension - Plan: cloNIDine (CATAPRES) 0.1 MG tablet  Patient has history of cocaine abuse although he denies that he is using cocaine. I will start the patient on clonidine 0.1 mg by mouth twice a day recheck blood pressure in one week. Emergency room immediately if he develops chest pain, shortness of breath, or severe headache.

## 2014-01-08 ENCOUNTER — Ambulatory Visit (INDEPENDENT_AMBULATORY_CARE_PROVIDER_SITE_OTHER): Payer: BC Managed Care – PPO | Admitting: Family Medicine

## 2014-01-08 ENCOUNTER — Encounter: Payer: Self-pay | Admitting: Family Medicine

## 2014-01-08 VITALS — BP 162/98 | HR 58 | Temp 98.1°F | Resp 14 | Ht 72.0 in | Wt 168.0 lb

## 2014-01-08 DIAGNOSIS — I10 Essential (primary) hypertension: Secondary | ICD-10-CM

## 2014-01-08 NOTE — Progress Notes (Signed)
Subjective:    Patient ID: Oscar Greene, male    DOB: Jul 31, 1950, 63 y.o.   MRN: [id number]  HPI 12/30/13 Patient presents today with severely elevated blood pressure 190/100. He was referred by his eye doctor where his blood pressure was found to be greater than 200/100 when he recently had surgery for glaucoma. I rechecked his blood pressure myself today and found to be 192/110. He denies any chest pain shortness of breath or dyspnea on exertion. He denies any use of stimulants, amphetamine, or cocaine. His blood pressure has been marginally elevated in the past in the 170s over 100s but never this high. He does admit to using excessive amounts of salt. He denies any headaches or blurry vision.  At that time, my plan was: Patient has history of cocaine abuse although he denies that he is using cocaine. I will start the patient on clonidine 0.1 mg by mouth twice a day recheck blood pressure in one week. Emergency room immediately if he develops chest pain, shortness of breath, or severe headache.  01/08/14 Patient's blood pressure is better on clonidine 0.1 by mouth twice a day. Patient denies any chest pain shortness of breath or dyspnea on exertion. Unfortunately his blood pressure is still above goal. Past Medical History  Diagnosis Date  . Cataract   . Glaucoma   . Osteoporosis   . Headache(784.0)   . History of traumatic head injury 03/14/1983    A pitching wedge hit him in left frontal area   Past Surgical History  Procedure Laterality Date  . L frontal craniotomy to remove blood clot     Current Outpatient Prescriptions on File Prior to Visit  Medication Sig Dispense Refill  . aspirin EC 81 MG tablet Take 1 tablet (81 mg total) by mouth daily.  30 tablet  3  . cloNIDine (CATAPRES) 0.1 MG tablet Take 1 tablet (0.1 mg total) by mouth 2 (two) times daily.  60 tablet  3  . clopidogrel (PLAVIX) 75 MG tablet Take 1 tablet (75 mg total) by mouth daily.  30 tablet  3  .  hydroxypropyl methylcellulose / hypromellose (ISOPTO TEARS / GONIOVISC) 2.5 % ophthalmic solution Place 1 drop into both eyes 3 (three) times daily as needed for dry eyes.       No current facility-administered medications on file prior to visit.   No Known Allergies History   Social History  . Marital Status: Single    Spouse Name: N/A    Number of Children: N/A  . Years of Education: N/A   Occupational History  . Not on file.   Social History Main Topics  . Smoking status: Current Every Day Smoker  . Smokeless tobacco: Never Used  . Alcohol Use: Not on file  . Drug Use: No  . Sexual Activity: Yes   Other Topics Concern  . Not on file   Social History Narrative   Patient states that he lives alone.   States that his sister lives very close to him  (distance he describes is approx distance of one city block) distance from him.   States that he also has a brother who lives very close to him--says that he lives about the same distance from him as the sister does.   He was smoking about one pack a day till he stopped at the time of his hospitalization 11/2013.   Drug screen was positive for cocaine at admission to hospital 11/2013. Patient reports at office visit 11/2013  he does not use cocaine on a routine basis and that he was " at a party 2 weeks ago"      Review of Systems  All other systems reviewed and are negative.      Objective:   Physical Exam  Vitals reviewed. Constitutional: He is oriented to person, place, and time.  Cardiovascular: Normal rate, regular rhythm and normal heart sounds.   Pulmonary/Chest: Effort normal and breath sounds normal. No respiratory distress. He has no wheezes. He has no rales.  Abdominal: Soft. Bowel sounds are normal. He exhibits no distension. There is no tenderness. There is no rebound and no guarding.  Neurological: He is alert and oriented to person, place, and time. He has normal reflexes. No cranial nerve deficit. He exhibits  normal muscle tone. Coordination normal.          Assessment & Plan:  Essential hypertension  Patient's blood pressure remains elevated. Therefore I'll add benicar hct 20/12.5 poqday and recheck the patient's blood pressure in 2 weeks.

## 2014-01-15 ENCOUNTER — Ambulatory Visit: Payer: BC Managed Care – PPO | Admitting: Neurology

## 2014-01-16 ENCOUNTER — Encounter: Payer: Self-pay | Admitting: Neurology

## 2014-01-22 ENCOUNTER — Ambulatory Visit (INDEPENDENT_AMBULATORY_CARE_PROVIDER_SITE_OTHER): Payer: BC Managed Care – PPO | Admitting: Family Medicine

## 2014-01-22 ENCOUNTER — Encounter: Payer: Self-pay | Admitting: Family Medicine

## 2014-01-22 VITALS — BP 136/80 | HR 60 | Temp 98.0°F | Resp 16 | Ht 72.0 in | Wt 163.0 lb

## 2014-01-22 DIAGNOSIS — I10 Essential (primary) hypertension: Secondary | ICD-10-CM

## 2014-01-22 MED ORDER — LOSARTAN POTASSIUM-HCTZ 50-12.5 MG PO TABS: 1.0000 | ORAL_TABLET | Freq: Every day | ORAL | Status: DC
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
## 2014-03-04 ENCOUNTER — Other Ambulatory Visit: Payer: Self-pay | Admitting: Internal Medicine

## 2014-03-04 ENCOUNTER — Other Ambulatory Visit: Payer: Self-pay | Admitting: Family Medicine

## 2014-03-17 ENCOUNTER — Other Ambulatory Visit: Payer: Self-pay | Admitting: Internal Medicine

## 2014-03-20 ENCOUNTER — Other Ambulatory Visit: Payer: Self-pay | Admitting: Internal Medicine

## 2014-04-01 ENCOUNTER — Other Ambulatory Visit: Payer: Self-pay | Admitting: Family Medicine

## 2014-05-05 ENCOUNTER — Telehealth: Payer: Self-pay | Admitting: *Deleted

## 2014-05-05 NOTE — Telephone Encounter (Signed)
Submitted referral thru [facility] compass for referral to Dr. 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

## 2014-10-13 ENCOUNTER — Other Ambulatory Visit: Payer: Self-pay | Admitting: Family Medicine

## 2014-10-29 ENCOUNTER — Other Ambulatory Visit: Payer: Self-pay | Admitting: Family Medicine

## 2014-10-29 NOTE — Telephone Encounter (Signed)
Medication filled x1 with no refills.   Requires office visit before any further refills can be given.   Letter sent.  

## 2015-01-01 ENCOUNTER — Other Ambulatory Visit: Payer: Medicare Other

## 2015-01-01 DIAGNOSIS — Z0000 Encounter for general adult medical examination without abnormal findings: Secondary | ICD-10-CM

## 2015-01-01 DIAGNOSIS — I10 Essential (primary) hypertension: Secondary | ICD-10-CM

## 2015-01-01 DIAGNOSIS — Z125 Encounter for screening for malignant neoplasm of prostate: Secondary | ICD-10-CM

## 2015-01-01 DIAGNOSIS — N289 Disorder of kidney and ureter, unspecified: Secondary | ICD-10-CM

## 2015-01-01 DIAGNOSIS — I639 Cerebral infarction, unspecified: Secondary | ICD-10-CM

## 2015-01-01 DIAGNOSIS — Z79899 Other long term (current) drug therapy: Secondary | ICD-10-CM

## 2015-01-01 LAB — CBC WITH DIFFERENTIAL/PLATELET
BASOS PCT: 2 % — AB (ref 0–1)
Basophils Absolute: 0.1 10*3/uL (ref 0.0–0.1)
Eosinophils Absolute: 0.4 10*3/uL (ref 0.0–0.7)
Eosinophils Relative: 8 % — ABNORMAL HIGH (ref 0–5)
HCT: 38.9 % — ABNORMAL LOW (ref 39.0–52.0)
Hemoglobin: 12.9 g/dL — ABNORMAL LOW (ref 13.0–17.0)
Lymphocytes Relative: 27 % (ref 12–46)
Lymphs Abs: 1.5 10*3/uL (ref 0.7–4.0)
MCH: 31.9 pg (ref 26.0–34.0)
MCHC: 33.2 g/dL (ref 30.0–36.0)
MCV: 96 fL (ref 78.0–100.0)
MPV: 11.4 fL (ref 8.6–12.4)
Monocytes Absolute: 0.4 10*3/uL (ref 0.1–1.0)
Monocytes Relative: 7 % (ref 3–12)
NEUTROS ABS: 3.1 10*3/uL (ref 1.7–7.7)
NEUTROS PCT: 56 % (ref 43–77)
Platelets: 182 10*3/uL (ref 150–400)
RBC: 4.05 MIL/uL — AB (ref 4.22–5.81)
RDW: 13 % (ref 11.5–15.5)
WBC: 5.6 10*3/uL (ref 4.0–10.5)

## 2015-01-01 LAB — COMPLETE METABOLIC PANEL WITH GFR
ALBUMIN: 4 g/dL (ref 3.6–5.1)
ALT: 13 U/L (ref 9–46)
AST: 19 U/L (ref 10–35)
Alkaline Phosphatase: 40 U/L (ref 40–115)
BILIRUBIN TOTAL: 0.8 mg/dL (ref 0.2–1.2)
BUN: 29 mg/dL — ABNORMAL HIGH (ref 7–25)
CALCIUM: 8.9 mg/dL (ref 8.6–10.3)
CHLORIDE: 110 mmol/L (ref 98–110)
CO2: 27 mmol/L (ref 20–31)
Creat: 1.2 mg/dL (ref 0.70–1.25)
GFR, Est African American: 73 mL/min (ref >=60)
GFR, Est Non African American: 63 mL/min (ref >=60)
Glucose, Bld: 85 mg/dL (ref 70–99)
POTASSIUM: 4.5 mmol/L (ref 3.5–5.3)
Sodium: 142 mmol/L (ref 135–146)
TOTAL PROTEIN: 6.5 g/dL (ref 6.1–8.1)

## 2015-01-01 LAB — LIPID PANEL
Cholesterol: 137 mg/dL (ref 125–200)
HDL: 38 mg/dL — ABNORMAL LOW (ref >=40)
LDL CALC: 76 mg/dL (ref <130)
Total CHOL/HDL Ratio: 3.6 Ratio (ref <=5.0)
Triglycerides: 114 mg/dL (ref <150)
VLDL: 23 mg/dL (ref <30)

## 2015-01-01 LAB — TSH: TSH: 1.427 u[IU]/mL (ref 0.350–4.500)

## 2015-01-02 LAB — PSA, MEDICARE: PSA: 2.09 ng/mL (ref <=4.00)

## 2015-01-05 ENCOUNTER — Ambulatory Visit (INDEPENDENT_AMBULATORY_CARE_PROVIDER_SITE_OTHER): Payer: Medicare Other | Admitting: Family Medicine

## 2015-01-05 ENCOUNTER — Encounter: Payer: Self-pay | Admitting: Family Medicine

## 2015-01-05 VITALS — BP 120/80 | HR 68 | Temp 98.0°F | Resp 18 | Wt 154.0 lb

## 2015-01-05 DIAGNOSIS — Z0000 Encounter for general adult medical examination without abnormal findings: Secondary | ICD-10-CM

## 2015-01-05 DIAGNOSIS — Z23 Encounter for immunization: Secondary | ICD-10-CM

## 2015-01-05 DIAGNOSIS — F17200 Nicotine dependence, unspecified, uncomplicated: Secondary | ICD-10-CM

## 2015-01-05 DIAGNOSIS — I63031 Cerebral infarction due to thrombosis of right carotid artery: Secondary | ICD-10-CM | POA: Diagnosis not present

## 2015-01-05 DIAGNOSIS — Z720 Tobacco use: Secondary | ICD-10-CM | POA: Diagnosis not present

## 2015-01-05 DIAGNOSIS — Z1211 Encounter for screening for malignant neoplasm of colon: Secondary | ICD-10-CM | POA: Diagnosis not present

## 2015-01-05 DIAGNOSIS — I10 Essential (primary) hypertension: Secondary | ICD-10-CM | POA: Diagnosis not present
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
## 2015-01-05 NOTE — Addendum Note (Signed)
Addended by: Shary Decamp B on: 01/05/2015 02:47 PM   Modules accepted: Orders

## 2015-04-13 DIAGNOSIS — H34832 Tributary (branch) retinal vein occlusion, left eye, with macular edema: Secondary | ICD-10-CM | POA: Diagnosis not present

## 2015-04-28 ENCOUNTER — Other Ambulatory Visit: Payer: Self-pay | Admitting: Family Medicine

## 2015-06-10 DIAGNOSIS — H34832 Tributary (branch) retinal vein occlusion, left eye, with macular edema: Secondary | ICD-10-CM | POA: Diagnosis not present

## 2015-06-10 DIAGNOSIS — H35352 Cystoid macular degeneration, left eye: Secondary | ICD-10-CM | POA: Diagnosis not present

## 2015-06-12 DIAGNOSIS — N179 Acute kidney failure, unspecified: Secondary | ICD-10-CM

## 2015-06-12 HISTORY — DX: Acute kidney failure, unspecified: N17.9

## 2015-06-13 ENCOUNTER — Observation Stay (HOSPITAL_COMMUNITY)
Admission: EM | Admit: 2015-06-13 | Discharge: 2015-06-16 | Disposition: A | Discharge status: Home / Self Care | Payer: Medicare Other | Attending: Internal Medicine | Admitting: Internal Medicine

## 2015-06-13 ENCOUNTER — Emergency Department (HOSPITAL_COMMUNITY): Payer: Medicare Other

## 2015-06-13 ENCOUNTER — Encounter (HOSPITAL_COMMUNITY): Payer: Self-pay | Admitting: *Deleted

## 2015-06-13 DIAGNOSIS — J069 Acute upper respiratory infection, unspecified: Secondary | ICD-10-CM | POA: Diagnosis present

## 2015-06-13 DIAGNOSIS — D696 Thrombocytopenia, unspecified: Secondary | ICD-10-CM | POA: Diagnosis not present

## 2015-06-13 DIAGNOSIS — B9789 Other viral agents as the cause of diseases classified elsewhere: Secondary | ICD-10-CM

## 2015-06-13 DIAGNOSIS — I10 Essential (primary) hypertension: Secondary | ICD-10-CM

## 2015-06-13 DIAGNOSIS — R55 Syncope and collapse: Secondary | ICD-10-CM | POA: Diagnosis not present

## 2015-06-13 DIAGNOSIS — Z7902 Long term (current) use of antithrombotics/antiplatelets: Secondary | ICD-10-CM | POA: Diagnosis not present

## 2015-06-13 DIAGNOSIS — H269 Unspecified cataract: Secondary | ICD-10-CM | POA: Insufficient documentation

## 2015-06-13 DIAGNOSIS — R7989 Other specified abnormal findings of blood chemistry: Secondary | ICD-10-CM

## 2015-06-13 DIAGNOSIS — N179 Acute kidney failure, unspecified: Secondary | ICD-10-CM | POA: Diagnosis not present

## 2015-06-13 DIAGNOSIS — R531 Weakness: Secondary | ICD-10-CM | POA: Diagnosis not present

## 2015-06-13 DIAGNOSIS — H409 Unspecified glaucoma: Secondary | ICD-10-CM | POA: Diagnosis not present

## 2015-06-13 DIAGNOSIS — M810 Age-related osteoporosis without current pathological fracture: Secondary | ICD-10-CM | POA: Diagnosis not present

## 2015-06-13 DIAGNOSIS — F17200 Nicotine dependence, unspecified, uncomplicated: Secondary | ICD-10-CM | POA: Diagnosis present

## 2015-06-13 DIAGNOSIS — E860 Dehydration: Secondary | ICD-10-CM

## 2015-06-13 DIAGNOSIS — R110 Nausea: Secondary | ICD-10-CM | POA: Insufficient documentation

## 2015-06-13 DIAGNOSIS — F17210 Nicotine dependence, cigarettes, uncomplicated: Secondary | ICD-10-CM | POA: Diagnosis not present

## 2015-06-13 DIAGNOSIS — R404 Transient alteration of awareness: Secondary | ICD-10-CM | POA: Diagnosis not present

## 2015-06-13 DIAGNOSIS — R05 Cough: Secondary | ICD-10-CM | POA: Diagnosis not present

## 2015-06-13 DIAGNOSIS — Z79899 Other long term (current) drug therapy: Secondary | ICD-10-CM | POA: Insufficient documentation

## 2015-06-13 DIAGNOSIS — R778 Other specified abnormalities of plasma proteins: Secondary | ICD-10-CM | POA: Diagnosis present

## 2015-06-13 DIAGNOSIS — R197 Diarrhea, unspecified: Secondary | ICD-10-CM | POA: Diagnosis not present

## 2015-06-13 HISTORY — DX: Acute kidney failure, unspecified: N17.9

## 2015-06-13 HISTORY — DX: Thrombocytopenia, unspecified: D69.6

## 2015-06-13 LAB — COMPREHENSIVE METABOLIC PANEL
ALBUMIN: 3.5 g/dL (ref 3.5–5.0)
ALK PHOS: 47 U/L (ref 38–126)
ALT: 25 U/L (ref 17–63)
AST: 71 U/L — ABNORMAL HIGH (ref 15–41)
Anion gap: 9 (ref 5–15)
BUN: 36 mg/dL — AB (ref 6–20)
CHLORIDE: 104 mmol/L (ref 101–111)
CO2: 26 mmol/L (ref 22–32)
Calcium: 8.8 mg/dL — ABNORMAL LOW (ref 8.9–10.3)
Creatinine, Ser: 2.25 mg/dL — ABNORMAL HIGH (ref 0.61–1.24)
GFR calc Af Amer: 33 mL/min — ABNORMAL LOW (ref >60)
GFR calc non Af Amer: 29 mL/min — ABNORMAL LOW (ref >60)
Glucose, Bld: 120 mg/dL — ABNORMAL HIGH (ref 65–99)
Potassium: 5 mmol/L (ref 3.5–5.1)
SODIUM: 139 mmol/L (ref 135–145)
TOTAL PROTEIN: 6.2 g/dL — AB (ref 6.5–8.1)
Total Bilirubin: 1.4 mg/dL — ABNORMAL HIGH (ref 0.3–1.2)

## 2015-06-13 LAB — CBC
HCT: 41 % (ref 39.0–52.0)
Hemoglobin: 13.7 g/dL (ref 13.0–17.0)
MCH: 32 pg (ref 26.0–34.0)
MCHC: 33.4 g/dL (ref 30.0–36.0)
MCV: 95.8 fL (ref 78.0–100.0)
PLATELETS: 100 10*3/uL — AB (ref 150–400)
RBC: 4.28 MIL/uL (ref 4.22–5.81)
RDW: 13 % (ref 11.5–15.5)
WBC: 7.6 10*3/uL (ref 4.0–10.5)

## 2015-06-13 LAB — URINALYSIS, ROUTINE W REFLEX MICROSCOPIC
GLUCOSE, UA: NEGATIVE mg/dL
KETONES UR: 15 mg/dL — AB
Nitrite: NEGATIVE
Protein, ur: 100 mg/dL — AB
Specific Gravity, Urine: 1.021 (ref 1.005–1.030)
pH: 5.5 (ref 5.0–8.0)

## 2015-06-13 LAB — PROCALCITONIN: Procalcitonin: 0.39 ng/mL

## 2015-06-13 LAB — URINE MICROSCOPIC-ADD ON

## 2015-06-13 LAB — TROPONIN I
TROPONIN I: 0.06 ng/mL — AB (ref <0.031)
TROPONIN I: 0.05 ng/mL — AB (ref <0.031)

## 2015-06-13 LAB — CBC WITH DIFFERENTIAL/PLATELET
BASOS PCT: 0 %
Basophils Absolute: 0 10*3/uL (ref 0.0–0.1)
EOS ABS: 0 10*3/uL (ref 0.0–0.7)
EOS PCT: 0 %
HCT: 39.1 % (ref 39.0–52.0)
Hemoglobin: 13 g/dL (ref 13.0–17.0)
Lymphocytes Relative: 21 %
Lymphs Abs: 1.3 10*3/uL (ref 0.7–4.0)
MCH: 31.8 pg (ref 26.0–34.0)
MCHC: 33.2 g/dL (ref 30.0–36.0)
MCV: 95.6 fL (ref 78.0–100.0)
MONO ABS: 0.3 10*3/uL (ref 0.1–1.0)
MONOS PCT: 6 %
Neutro Abs: 4.4 10*3/uL (ref 1.7–7.7)
Neutrophils Relative %: 73 %
PLATELETS: 103 10*3/uL — AB (ref 150–400)
RBC: 4.09 MIL/uL — ABNORMAL LOW (ref 4.22–5.81)
RDW: 13 % (ref 11.5–15.5)
WBC: 6 10*3/uL (ref 4.0–10.5)

## 2015-06-13 LAB — LACTIC ACID, PLASMA
LACTIC ACID, VENOUS: 1 mmol/L (ref 0.5–2.0)
Lactic Acid, Venous: 1.2 mmol/L (ref 0.5–2.0)

## 2015-06-13 LAB — RAPID URINE DRUG SCREEN, HOSP PERFORMED
AMPHETAMINES: NOT DETECTED
BARBITURATES: NOT DETECTED
BENZODIAZEPINES: NOT DETECTED
COCAINE: POSITIVE — AB
Opiates: NOT DETECTED
Tetrahydrocannabinol: NOT DETECTED

## 2015-06-13 MED ORDER — TIMOLOL MALEATE 0.5 % OP SOLN
1.0000 [drp] | Freq: Two times a day (BID) | OPHTHALMIC | Status: DC
  Administered 2015-06-13 – 2015-06-16 (×6): 1 [drp] via OPHTHALMIC
  Filled 2015-06-13: qty 5

## 2015-06-13 MED ORDER — ONDANSETRON HCL 4 MG/2ML IJ SOLN: 4.0000 mg | Freq: Four times a day (QID) | INTRAMUSCULAR | Status: DC | PRN

## 2015-06-13 MED ORDER — ONDANSETRON HCL 4 MG PO TABS: 4.0000 mg | ORAL_TABLET | Freq: Four times a day (QID) | ORAL | Status: DC | PRN

## 2015-06-13 MED ORDER — FAMOTIDINE 20 MG PO TABS
20.0000 mg | ORAL_TABLET | Freq: Every day | ORAL | Status: AC
  Administered 2015-06-13: 20 mg via ORAL
  Filled 2015-06-13: qty 1

## 2015-06-13 MED ORDER — SODIUM CHLORIDE 0.9 % IV SOLN
INTRAVENOUS | Status: AC
  Administered 2015-06-13: 20:00:00 via INTRAVENOUS

## 2015-06-13 MED ORDER — NICOTINE 14 MG/24HR TD PT24: 14.0000 mg | MEDICATED_PATCH | Freq: Every day | TRANSDERMAL | Status: DC | PRN

## 2015-06-13 MED ORDER — ACETAMINOPHEN 650 MG RE SUPP: 650.0000 mg | Freq: Four times a day (QID) | RECTAL | Status: DC | PRN

## 2015-06-13 MED ORDER — SODIUM CHLORIDE 0.9 % IV BOLUS (SEPSIS)
500.0000 mL | Freq: Once | INTRAVENOUS | Status: AC
  Administered 2015-06-13: 500 mL via INTRAVENOUS

## 2015-06-13 MED ORDER — HYDROCODONE-ACETAMINOPHEN 5-325 MG PO TABS
1.0000 | ORAL_TABLET | ORAL | Status: DC | PRN
  Administered 2015-06-14: 1 via ORAL
  Administered 2015-06-15 (×2): 2 via ORAL
  Filled 2015-06-13 (×2): qty 2
  Filled 2015-06-13: qty 1

## 2015-06-13 MED ORDER — ACETAMINOPHEN 325 MG PO TABS: 650.0000 mg | ORAL_TABLET | Freq: Four times a day (QID) | ORAL | Status: DC | PRN

## 2015-06-13 MED ORDER — DORZOLAMIDE HCL-TIMOLOL MAL 2-0.5 % OP SOLN
1.0000 [drp] | Freq: Two times a day (BID) | OPHTHALMIC | Status: DC
  Filled 2015-06-13: qty 10

## 2015-06-13 MED ORDER — CLOPIDOGREL BISULFATE 75 MG PO TABS
75.0000 mg | ORAL_TABLET | Freq: Every day | ORAL | Status: DC
  Administered 2015-06-14 – 2015-06-16 (×3): 75 mg via ORAL
  Filled 2015-06-13 (×3): qty 1

## 2015-06-13 MED ORDER — DORZOLAMIDE HCL 2 % OP SOLN
1.0000 [drp] | Freq: Two times a day (BID) | OPHTHALMIC | Status: DC
  Administered 2015-06-13 – 2015-06-16 (×6): 1 [drp] via OPHTHALMIC
  Filled 2015-06-13: qty 10

## 2015-06-13 MED ORDER — HEPARIN SODIUM (PORCINE) 5000 UNIT/ML IJ SOLN
5000.0000 [IU] | Freq: Three times a day (TID) | INTRAMUSCULAR | Status: DC
  Administered 2015-06-13: 5000 [IU] via SUBCUTANEOUS
  Filled 2015-06-13: qty 1

## 2015-06-13 MED ORDER — POLYETHYLENE GLYCOL 3350 17 G PO PACK: 17.0000 g | PACK | Freq: Every day | ORAL | Status: DC | PRN

## 2015-06-13 MED ORDER — ASPIRIN 81 MG PO CHEW
324.0000 mg | CHEWABLE_TABLET | Freq: Once | ORAL | Status: AC
Start: 2015-06-13 — End: 2015-06-13
  Administered 2015-06-13: 324 mg via ORAL
  Filled 2015-06-13: qty 4

## 2015-06-13 MED ORDER — SODIUM CHLORIDE 0.9 % IV BOLUS (SEPSIS)
1000.0000 mL | Freq: Once | INTRAVENOUS | Status: AC
  Administered 2015-06-13: 1000 mL via INTRAVENOUS

## 2015-06-13 MED ORDER — SODIUM CHLORIDE 0.9% FLUSH: 3.0000 mL | Freq: Two times a day (BID) | INTRAVENOUS | Status: DC
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
## 2015-06-13 NOTE — ED Notes (Signed)
Pt arrived by [facility]. Reports not feeling well since wed. Having decreased appetite and fatigue. Had near syncopal episode today. Was hypotensive on ems arrival, given fluid bolus 44ml pta.
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
## 2015-06-14 DIAGNOSIS — E860 Dehydration: Secondary | ICD-10-CM

## 2015-06-14 DIAGNOSIS — R7989 Other specified abnormal findings of blood chemistry: Secondary | ICD-10-CM | POA: Diagnosis not present

## 2015-06-14 DIAGNOSIS — N179 Acute kidney failure, unspecified: Secondary | ICD-10-CM

## 2015-06-14 DIAGNOSIS — R531 Weakness: Secondary | ICD-10-CM | POA: Diagnosis not present

## 2015-06-14 LAB — BASIC METABOLIC PANEL
ANION GAP: 8 (ref 5–15)
BUN: 26 mg/dL — ABNORMAL HIGH (ref 6–20)
CHLORIDE: 110 mmol/L (ref 101–111)
CO2: 21 mmol/L — AB (ref 22–32)
Calcium: 8 mg/dL — ABNORMAL LOW (ref 8.9–10.3)
Creatinine, Ser: 1.26 mg/dL — ABNORMAL HIGH (ref 0.61–1.24)
GFR calc non Af Amer: 58 mL/min — ABNORMAL LOW (ref >60)
Glucose, Bld: 104 mg/dL — ABNORMAL HIGH (ref 65–99)
Potassium: 3.8 mmol/L (ref 3.5–5.1)
Sodium: 139 mmol/L (ref 135–145)

## 2015-06-14 LAB — INFLUENZA PANEL BY PCR (TYPE A & B)
H1N1FLUPCR: NOT DETECTED
INFLAPCR: NEGATIVE
Influenza B By PCR: POSITIVE — AB

## 2015-06-14 LAB — TROPONIN I
TROPONIN I: 0.07 ng/mL — AB (ref <0.031)
TROPONIN I: 0.07 ng/mL — AB (ref <0.031)

## 2015-06-14 LAB — MAGNESIUM: MAGNESIUM: 2.1 mg/dL (ref 1.7–2.4)

## 2015-06-14 MED ORDER — OSELTAMIVIR PHOSPHATE 75 MG PO CAPS
75.0000 mg | ORAL_CAPSULE | Freq: Two times a day (BID) | ORAL | Status: DC
  Administered 2015-06-14 – 2015-06-16 (×5): 75 mg via ORAL
  Filled 2015-06-14 (×7): qty 1

## 2015-06-14 MED ORDER — SODIUM CHLORIDE 0.9 % IV SOLN
INTRAVENOUS | Status: DC
  Administered 2015-06-14 – 2015-06-16 (×3): via INTRAVENOUS

## 2015-06-14 MED ORDER — SODIUM CHLORIDE 0.9 % IV SOLN: INTRAVENOUS | Status: DC

## 2015-06-14 MED ORDER — ENOXAPARIN SODIUM 40 MG/0.4ML ~~LOC~~ SOLN
40.0000 mg | SUBCUTANEOUS | Status: DC
  Administered 2015-06-14: 40 mg via SUBCUTANEOUS
  Filled 2015-06-14: qty 0.4

## 2015-06-14 MED ORDER — GUAIFENESIN-DM 100-10 MG/5ML PO SYRP
5.0000 mL | ORAL_SOLUTION | ORAL | Status: DC | PRN
  Administered 2015-06-14 – 2015-06-16 (×4): 5 mL via ORAL
  Filled 2015-06-14 (×4): qty 5

## 2015-06-14 NOTE — Care Management Obs Status (Signed)
[facility] NOTIFICATION   Patient Details  Name: Oscar Greene MRN: [id number] Date of Birth: [birth year]   Medicare Observation Status Notification Given:  Yes    Lavoy Bernards, Rory Percy, RN 06/14/2015, 11:51 AM

## 2015-06-14 NOTE — Progress Notes (Addendum)
[facility] PROGRESS NOTE  Oscar Greene [id number] DOB: [birth year] DOA: 06/13/2015 PCP: Odette Fraction, MD  Length of stay:    Assessment/Plan: Principal Problem:   Dehydration Active Problems:   Hypertension   Smoker   AKI (acute kidney injury) ([place])   Viral URI with cough   Thrombocytopenia (HCC)   Elevated troponin   Generalized weakness   HPI: Oscar Greene is a 65 y.o. male with PMH of hypertension, glaucoma, and tobacco abuse who presents to the ED with 4 days of sore throat, nonproductive cough, sinus congestion, malaise, and decreased appetite. Patient's symptoms have continued to progress over the past 4 days, culminating in a near syncopal episode today that prompted his visit to the ED. Patient lives alone, but notes a sick contact in the workplace who had URI symptoms with cough. Patient denies fevers, but endorses chills. He denies chest pain, palpitations, nausea, vomiting, or diarrhea. 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 Patient's sister brought him some Coricidin D which she has been using with no appreciable improvement. He has had decreased appetite and decreased oral intake during this illness.  In ED, patient was found to be afebrile, saturating adequately on room air, with blood pressure 106/71 and normal pulse and respiratory rates. EKG was obtained and demonstrates a sinus rhythm with LVH by voltage criteria and nonspecific ST-T wave changes in the septal leads. Chest x-ray is negative for acute cardiopulmonary disease. Troponin is mildly elevated to 0.06. Urine is obtained for analysis and suggestive of dehydration, but grossly negative for infection. CMP features a serum creatinine of 2.25, up from an apparent baseline of 1.1. There is a mild elevation in total bilirubin 1.4 which is new from prior labs. CBC features a thrombocytopenia with platelet count 100,000. Patient was  bolused with 1.5 L normal saline in the emergency department and reported some subjective improvement with this. Blood pressure improved after the fluid bolus, patient remained hemodynamically stable, and will be admitted for ongoing evaluation and management of dehydration with AKI suspected secondary to acute viral URI.  Assessment and plan  1. Dehydration with AKI  - SCr is 2.25 on admission, up from an apparent baseline of 1.1 , now 1.26 - Suspect this is multifactorial, primarily driven by dehydration in the setting of acute URI with decreased PO intake, exacerbated by ARB and HCTZ use Continue IV fluids - Hold losartan-HCTZ  - Repeat chemistry panel in am   2. Suspected acute viral URI  - Droplet precautions  - Flu PCR positive for influenza B, started on Tamiflu - Pt declined the influenza vaccine this yr per PCP notes    3. Chronic hypertension  - BP currently on the lower side in setting of dehydration  - Holding losartan, HCTZ  - Fluid resuscitating  - Resume antihypertensives once kidney function stabilizes   4. Thrombocytopenia  - Platelet 100,000 on admission , likely secondary to viral illness Expected to improve - Had mild thrombocytopenia in 2015  - Cautiously continue with pharmacologic VTE ppx , started on Lovenox - Repeat CBC in am   5. Elevation in cardiac biomarker - Troponin 0.06 on admission - No CP; non-specific ST-T change on EKG - Suspect this to be a demand ischemia in setting of dehydration, low BPs on arrival  - Monitor on telemetry for ischemic changes and obtain serial troponins overnight  - Give ASA 324 mg  now   6. Bilirubin elevation  - Mild elevation to 1.4 on arrival, suspected secondary to acute illness with dehydration  - Anticipate resolution with fluid resuscitation, though may take a couple days for bilirubin level to trend down  - No RUQ pain or sxs suggestive of significant liver pathology       DVT  prophylaxsis Lovenox  Code Status:      Code Status Orders        Start     Ordered   06/13/15 1952  Full code   Continuous     06/13/15 1954    Code Status History    Date Active Date Inactive Code Status Order ID Comments User Context   11/17/2013  4:39 PM 11/19/2013  7:44 PM Full Code [id number]  Otho Bellows, MD ED      Family Communication: Discussed in detail with the patient, all imaging results, lab results explained to the patient   Disposition Plan:  Anticipate discharge tomorrow      Consultants:  None   Procedures:  None   Antibiotics: Anti-infectives    Start     Dose/Rate Route Frequency Ordered Stop   06/14/15 1230  oseltamivir (TAMIFLU) capsule 75 mg     75 mg Oral 2 times daily 06/14/15 1159 06/19/15 0959         HPI/Subjective: Continues to have a nonproductive cough, low-grade temperature  Objective: Filed Vitals:   06/13/15 1945 06/13/15 2020 06/14/15 0425 06/14/15 1100  BP: 125/77 119/66 121/67 121/70  Pulse: 71 76 85 77  Temp:  98.2 F (36.8 C) 98 F (36.7 C) 99.1 F (37.3 C)  TempSrc:  Oral Oral Oral  Resp: 21 20 20 20   SpO2: 98% 98% 96% 98%    Intake/Output Summary (Last 24 hours) at 06/14/15 1200 Last data filed at 06/14/15 0517  Gross per 24 hour  Intake   1185 ml  Output      0 ml  Net   1185 ml    Exam:  General: No acute respiratory distress Lungs: Clear to auscultation bilaterally without wheezes or crackles Cardiovascular: Regular rate and rhythm without murmur gallop or rub normal S1 and S2 Abdomen: Nontender, nondistended, soft, bowel sounds positive, no rebound, no ascites, no appreciable mass Extremities: No significant cyanosis, clubbing, or edema bilateral lower extremities     Data Review   Micro Results No results found for this or any previous visit (from the past 240 hour(s)).  Radiology Reports Dg Chest 2 View  06/13/2015  CLINICAL DATA:  65 year old male with cough, near syncope and  hypotension. EXAM: CHEST  2 VIEW COMPARISON:  11/17/2013 chest radiograph FINDINGS: The cardiomediastinal silhouette is unremarkable. There is no evidence of focal airspace disease, pulmonary edema, suspicious pulmonary nodule/mass, pleural effusion, or pneumothorax. No acute bony abnormalities are identified. IMPRESSION: No active cardiopulmonary disease. Electronically Signed   By: Margarette Canada M.D.   On: 06/13/2015 15:38     CBC  Recent Labs Lab 06/13/15 1528 06/13/15 2025  WBC 7.6 6.0  HGB 13.7 13.0  HCT 41.0 39.1  PLT 100* 103*  MCV 95.8 95.6  MCH 32.0 31.8  MCHC 33.4 33.2  RDW 13.0 13.0  LYMPHSABS  --  1.3  MONOABS  --  0.3  EOSABS  --  0.0  BASOSABS  --  0.0    Chemistries   Recent Labs Lab 06/13/15 1528 06/14/15 0200  NA 139 139  K 5.0 3.8  CL 104 110  CO2 26 21*  GLUCOSE 120* 104*  BUN 36* 26*  CREATININE 2.25* 1.26*  CALCIUM 8.8* 8.0*  AST 71*  --   ALT 25  --   ALKPHOS 47  --   BILITOT 1.4*  --    ------------------------------------------------------------------------------------------------------------------ CrCl cannot be calculated (Unknown ideal weight.). ------------------------------------------------------------------------------------------------------------------ No results for input(s): HGBA1C in the last 72 hours. ------------------------------------------------------------------------------------------------------------------ No results for input(s): CHOL, HDL, LDLCALC, TRIG, CHOLHDL, LDLDIRECT in the last 72 hours. ------------------------------------------------------------------------------------------------------------------ No results for input(s): TSH, T4TOTAL, T3FREE, THYROIDAB in the last 72 hours.  Invalid input(s): FREET3 ------------------------------------------------------------------------------------------------------------------ No results for input(s): VITAMINB12, FOLATE, FERRITIN, TIBC, IRON, RETICCTPCT in the last 72  hours.  Coagulation profile No results for input(s): INR, PROTIME in the last 168 hours.  No results for input(s): DDIMER in the last 72 hours.  Cardiac Enzymes  Recent Labs Lab 06/13/15 2025 06/14/15 0200 06/14/15 0840  TROPONINI 0.05* 0.07* 0.07*   ------------------------------------------------------------------------------------------------------------------ Invalid input(s): POCBNP   CBG: No results for input(s): GLUCAP in the last 168 hours.     Studies: Dg Chest 2 View  06/13/2015  CLINICAL DATA:  65 year old male with cough, near syncope and hypotension. EXAM: CHEST  2 VIEW COMPARISON:  11/17/2013 chest radiograph FINDINGS: The cardiomediastinal silhouette is unremarkable. There is no evidence of focal airspace disease, pulmonary edema, suspicious pulmonary nodule/mass, pleural effusion, or pneumothorax. No acute bony abnormalities are identified. IMPRESSION: No active cardiopulmonary disease. Electronically Signed   By: Margarette Canada M.D.   On: 06/13/2015 15:38      Lab Results  Component Value Date   HGBA1C 5.0 11/18/2013   Lab Results  Component Value Date   LDLCALC 76 01/01/2015   CREATININE 1.26* 06/14/2015       Scheduled Meds: . clopidogrel  75 mg Oral Daily  . dorzolamide  1 drop Both Eyes BID   And  . timolol  1 drop Both Eyes BID  . heparin  5,000 Units Subcutaneous 3 times per day  . oseltamivir  75 mg Oral BID  . sodium chloride flush  3 mL Intravenous Q12H   Continuous Infusions:   Principal Problem:   Dehydration Active Problems:   Hypertension   Smoker   AKI (acute kidney injury) ([place])   Viral URI with cough   Thrombocytopenia (HCC)   Elevated troponin   Generalized weakness    Time spent: 45 minutes   [facility] Pager [phone]. If 7PM-7AM, please contact night-coverage at [email or web address], password [facility] 06/14/2015, 12:00 PM

## 2015-06-15 ENCOUNTER — Ambulatory Visit (HOSPITAL_BASED_OUTPATIENT_CLINIC_OR_DEPARTMENT_OTHER): Payer: Medicare Other

## 2015-06-15 DIAGNOSIS — R079 Chest pain, unspecified: Secondary | ICD-10-CM | POA: Diagnosis not present

## 2015-06-15 DIAGNOSIS — E860 Dehydration: Secondary | ICD-10-CM | POA: Diagnosis not present

## 2015-06-15 DIAGNOSIS — R7989 Other specified abnormal findings of blood chemistry: Secondary | ICD-10-CM | POA: Diagnosis not present

## 2015-06-15 DIAGNOSIS — N179 Acute kidney failure, unspecified: Secondary | ICD-10-CM | POA: Diagnosis not present

## 2015-06-15 DIAGNOSIS — R531 Weakness: Secondary | ICD-10-CM | POA: Diagnosis not present

## 2015-06-15 LAB — COMPREHENSIVE METABOLIC PANEL
ALT: 21 U/L (ref 17–63)
ANION GAP: 6 (ref 5–15)
AST: 62 U/L — AB (ref 15–41)
Albumin: 2.6 g/dL — ABNORMAL LOW (ref 3.5–5.0)
Alkaline Phosphatase: 41 U/L (ref 38–126)
BILIRUBIN TOTAL: 1.1 mg/dL (ref 0.3–1.2)
BUN: 9 mg/dL (ref 6–20)
CO2: 25 mmol/L (ref 22–32)
Calcium: 7.8 mg/dL — ABNORMAL LOW (ref 8.9–10.3)
Chloride: 109 mmol/L (ref 101–111)
Creatinine, Ser: 1.09 mg/dL (ref 0.61–1.24)
GFR calc Af Amer: >60 mL/min (ref >60)
Glucose, Bld: 96 mg/dL (ref 65–99)
POTASSIUM: 3.5 mmol/L (ref 3.5–5.1)
Sodium: 140 mmol/L (ref 135–145)
TOTAL PROTEIN: 5.1 g/dL — AB (ref 6.5–8.1)

## 2015-06-15 LAB — CBC
HEMATOCRIT: 32.7 % — AB (ref 39.0–52.0)
Hemoglobin: 10.5 g/dL — ABNORMAL LOW (ref 13.0–17.0)
MCH: 30.6 pg (ref 26.0–34.0)
MCHC: 32.1 g/dL (ref 30.0–36.0)
MCV: 95.3 fL (ref 78.0–100.0)
Platelets: 95 10*3/uL — ABNORMAL LOW (ref 150–400)
RBC: 3.43 MIL/uL — ABNORMAL LOW (ref 4.22–5.81)
RDW: 13.2 % (ref 11.5–15.5)
WBC: 5.6 10*3/uL (ref 4.0–10.5)

## 2015-06-15 LAB — PROCALCITONIN: Procalcitonin: 0.14 ng/mL

## 2015-06-15 LAB — ECHOCARDIOGRAM COMPLETE

## 2015-06-15 LAB — GLUCOSE, CAPILLARY: GLUCOSE-CAPILLARY: 101 mg/dL — AB (ref 65–99)

## 2015-06-15 MED ORDER — PANTOPRAZOLE SODIUM 40 MG PO TBEC
40.0000 mg | DELAYED_RELEASE_TABLET | Freq: Every day | ORAL | Status: DC
  Administered 2015-06-15 – 2015-06-16 (×2): 40 mg via ORAL
  Filled 2015-06-15 (×2): qty 1

## 2015-06-15 MED ORDER — PREDNISONE 50 MG PO TABS
60.0000 mg | ORAL_TABLET | Freq: Every day | ORAL | Status: DC
  Administered 2015-06-15 – 2015-06-16 (×2): 60 mg via ORAL
  Filled 2015-06-15 (×2): qty 1

## 2015-06-15 MED ORDER — LEVALBUTEROL HCL 1.25 MG/0.5ML IN NEBU: 1.2500 mg | INHALATION_SOLUTION | Freq: Three times a day (TID) | RESPIRATORY_TRACT | Status: DC

## 2015-06-15 MED ORDER — ASPIRIN 81 MG PO CHEW
81.0000 mg | CHEWABLE_TABLET | Freq: Every day | ORAL | Status: DC
  Administered 2015-06-15 – 2015-06-16 (×2): 81 mg via ORAL
  Filled 2015-06-15 (×2): qty 1

## 2015-06-15 MED ORDER — LEVALBUTEROL HCL 1.25 MG/0.5ML IN NEBU
1.2500 mg | INHALATION_SOLUTION | Freq: Three times a day (TID) | RESPIRATORY_TRACT | Status: DC | PRN
  Administered 2015-06-15: 1.25 mg via RESPIRATORY_TRACT
  Filled 2015-06-15: qty 0.5

## 2015-06-15 NOTE — Progress Notes (Signed)
[facility] PROGRESS NOTE  Oscar Greene [id number] DOB: 1950-10-12 DOA: 06/13/2015 PCP: Odette Fraction, MD  Length of stay:    Assessment/Plan: Principal Problem:   Dehydration Active Problems:   Hypertension   Smoker   AKI (acute kidney injury) ([place])   Viral URI with cough   Thrombocytopenia (HCC)   Elevated troponin   Generalized weakness    HPI: Oscar Greene is a 65 y.o. male with PMH of hypertension, glaucoma, and tobacco abuse who presents to the ED with 4 days of sore throat, nonproductive cough, sinus congestion, malaise, and decreased appetite. Patient's symptoms have continued to progress over the past 4 days, culminating in a near syncopal episode today that prompted his visit to the ED. Patient lives alone, but notes a sick contact in the workplace who had URI symptoms with cough. Patient denies fevers, but endorses chills. He denies chest pain, palpitations, nausea, vomiting, or diarrhea. 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 Patient's sister brought him some Coricidin D which she has been using with no appreciable improvement. He has had decreased appetite and decreased oral intake during this illness.  In ED, patient was found to be afebrile, saturating adequately on room air, with blood pressure 106/71 and normal pulse and respiratory rates. EKG was obtained and demonstrates a sinus rhythm with LVH by voltage criteria and nonspecific ST-T wave changes in the septal leads. Chest x-ray is negative for acute cardiopulmonary disease. Troponin is mildly elevated to 0.06. Urine is obtained for analysis and suggestive of dehydration, but grossly negative for infection. CMP features a serum creatinine of 2.25, up from an apparent baseline of 1.1. There is a mild elevation in total bilirubin 1.4 which is new from prior labs. CBC features a thrombocytopenia with platelet count 100,000. Patient was  bolused with 1.5 L normal saline in the emergency department and reported some subjective improvement with this. Blood pressure improved after the fluid bolus, patient remained hemodynamically stable, and will be admitted for ongoing evaluation and management of dehydration with AKI suspected secondary to acute viral URI.  Assessment and plan  1. Dehydration with AKI  - SCr is 2.25 on admission, improving, CMP from this morning is pending - Suspect this is multifactorial, primarily driven by dehydration in the setting of acute URI with decreased PO intake, exacerbated by ARB and HCTZ use Continue IV fluids - Hold losartan-HCTZ  - Repeat chemistry panel in am   2. Acute bronchitis secondary to influenza B -- Flu PCR positive for influenza B, started on Tamiflu - Pt declined the influenza vaccine this yr per PCP notes  Wheezing start patient on prednisone Xopenex nebulizer  3. Chronic hypertension  - BP currently on the lower side in setting of dehydration  - Holding losartan, HCTZ  Continue IV fluids - Resume antihypertensives once kidney function stabilizes   4. Thrombocytopenia  - Platelet 100,000 on admission , trending down, DC Lovenox SCDs for DVT prophylaxis - Had mild thrombocytopenia in 2015  - Cautiously continue with pharmacologic VTE ppx ,   - Repeat CBC in am   5. Elevation in cardiac biomarker - Troponin 0.06 on admission - No CP; non-specific ST-T change on EKG - Suspect this to be a demand ischemia in setting of dehydration, low BPs on arrival  - Monitor on telemetry for ischemic changes and obtain serial troponins overnight  Continue aspirin 81 mg a day ,  2-D echo to rule out wall motion abnormalities   6. Bilirubin elevation  - Mild elevation to 1.4 on arrival, suspected secondary to acute illness with dehydration , CMP pending  - No RUQ pain or sxs suggestive of significant liver pathology      DVT prophylaxsis Lovenox  Code  Status:      Code Status Orders        Start     Ordered   06/13/15 1952  Full code   Continuous     06/13/15 1954     Family Communication: Discussed in detail with the patient, all imaging results, lab results explained to the patient   Disposition Plan:  Anticipate discharge tomorrow      Consultants:  None   Procedures:  None   Antibiotics: Anti-infectives    Start     Dose/Rate Route Frequency Ordered Stop   06/14/15 1230  oseltamivir (TAMIFLU) capsule 75 mg     75 mg Oral 2 times daily 06/14/15 1159 06/19/15 0959         HPI/Subjective: Continues to have low-grade temperature, wheezing  Objective: Filed Vitals:   06/14/15 1100 06/14/15 1700 06/15/15 0442 06/15/15 0823  BP: 121/70 144/87 94/68 108/66  Pulse: 77 85 68 71  Temp: 99.1 F (37.3 C) 98.1 F (36.7 C) 98.4 F (36.9 C) 98.5 F (36.9 C)  TempSrc: Oral Oral Oral Oral  Resp: 20 20 18 17   SpO2: 98% 96% 92% 91%    Intake/Output Summary (Last 24 hours) at 06/15/15 0843 Last data filed at 06/15/15 [id number]  Gross per 24 hour  Intake   2085 ml  Output      0 ml  Net   2085 ml    Exam:  General: No acute respiratory distress Lungs: Wheezing Cardiovascular: Regular rate and rhythm without murmur gallop or rub normal S1 and S2 Abdomen: Nontender, nondistended, soft, bowel sounds positive, no rebound, no ascites, no appreciable mass Extremities: No significant cyanosis, clubbing, or edema bilateral lower extremities     Data Review   Micro Results No results found for this or any previous visit (from the past 240 hour(s)).  Radiology Reports Dg Chest 2 View  06/13/2015  CLINICAL DATA:  65 year old male with cough, near syncope and hypotension. EXAM: CHEST  2 VIEW COMPARISON:  11/17/2013 chest radiograph FINDINGS: The cardiomediastinal silhouette is unremarkable. There is no evidence of focal airspace disease, pulmonary edema, suspicious pulmonary nodule/mass, pleural effusion, or  pneumothorax. No acute bony abnormalities are identified. IMPRESSION: No active cardiopulmonary disease. Electronically Signed   By: Margarette Canada M.D.   On: 06/13/2015 15:38     CBC  Recent Labs Lab 06/13/15 1528 06/13/15 2025 06/15/15 0629  WBC 7.6 6.0 5.6  HGB 13.7 13.0 10.5*  HCT 41.0 39.1 32.7*  PLT 100* 103* 95*  MCV 95.8 95.6 95.3  MCH 32.0 31.8 30.6  MCHC 33.4 33.2 32.1  RDW 13.0 13.0 13.2  LYMPHSABS  --  1.3  --   MONOABS  --  0.3  --   EOSABS  --  0.0  --   BASOSABS  --  0.0  --     Chemistries   Recent Labs Lab 06/13/15 1528 06/14/15 0200 06/14/15 1240  NA 139 139  --   K 5.0 3.8  --   CL 104 110  --   CO2 26 21*  --   GLUCOSE 120* 104*  --   BUN 36* 26*  --   CREATININE 2.25*  1.26*  --   CALCIUM 8.8* 8.0*  --   MG  --   --  2.1  AST 71*  --   --   ALT 25  --   --   ALKPHOS 47  --   --   BILITOT 1.4*  --   --    ------------------------------------------------------------------------------------------------------------------ CrCl cannot be calculated (Unknown ideal weight.). ------------------------------------------------------------------------------------------------------------------ No results for input(s): HGBA1C in the last 72 hours. ------------------------------------------------------------------------------------------------------------------ No results for input(s): CHOL, HDL, LDLCALC, TRIG, CHOLHDL, LDLDIRECT in the last 72 hours. ------------------------------------------------------------------------------------------------------------------ No results for input(s): TSH, T4TOTAL, T3FREE, THYROIDAB in the last 72 hours.  Invalid input(s): FREET3 ------------------------------------------------------------------------------------------------------------------ No results for input(s): VITAMINB12, FOLATE, FERRITIN, TIBC, IRON, RETICCTPCT in the last 72 hours.  Coagulation profile No results for input(s): INR, PROTIME in the last 168  hours.  No results for input(s): DDIMER in the last 72 hours.  Cardiac Enzymes  Recent Labs Lab 06/13/15 2025 06/14/15 0200 06/14/15 0840  TROPONINI 0.05* 0.07* 0.07*   ------------------------------------------------------------------------------------------------------------------ Invalid input(s): POCBNP   CBG: No results for input(s): GLUCAP in the last 168 hours.     Studies: Dg Chest 2 View  06/13/2015  CLINICAL DATA:  65 year old male with cough, near syncope and hypotension. EXAM: CHEST  2 VIEW COMPARISON:  11/17/2013 chest radiograph FINDINGS: The cardiomediastinal silhouette is unremarkable. There is no evidence of focal airspace disease, pulmonary edema, suspicious pulmonary nodule/mass, pleural effusion, or pneumothorax. No acute bony abnormalities are identified. IMPRESSION: No active cardiopulmonary disease. Electronically Signed   By: Margarette Canada M.D.   On: 06/13/2015 15:38      Lab Results  Component Value Date   HGBA1C 5.0 11/18/2013   Lab Results  Component Value Date   LDLCALC 76 01/01/2015   CREATININE 1.26* 06/14/2015       Scheduled Meds: . clopidogrel  75 mg Oral Daily  . dorzolamide  1 drop Both Eyes BID   And  . timolol  1 drop Both Eyes BID  . oseltamivir  75 mg Oral BID  . predniSONE  60 mg Oral Q breakfast  . sodium chloride flush  3 mL Intravenous Q12H   Continuous Infusions: . sodium chloride 75 mL/hr at 06/14/15 1300    Principal Problem:   Dehydration Active Problems:   Hypertension   Smoker   AKI (acute kidney injury) ([place])   Viral URI with cough   Thrombocytopenia (HCC)   Elevated troponin   Generalized weakness    Time spent: 45 minutes   [facility] Pager [phone]. If 7PM-7AM, please contact night-coverage at [email or web address], password [facility] 06/15/2015, 8:43 AM

## 2015-06-15 NOTE — Progress Notes (Signed)
  Echocardiogram 2D Echocardiogram has been performed.  Oscar Greene 06/15/2015, 10:17 AM

## 2015-06-16 ENCOUNTER — Encounter (HOSPITAL_COMMUNITY): Payer: Self-pay | Admitting: General Practice

## 2015-06-16 DIAGNOSIS — N179 Acute kidney failure, unspecified: Secondary | ICD-10-CM | POA: Diagnosis not present

## 2015-06-16 DIAGNOSIS — E860 Dehydration: Secondary | ICD-10-CM | POA: Diagnosis not present

## 2015-06-16 DIAGNOSIS — R7989 Other specified abnormal findings of blood chemistry: Secondary | ICD-10-CM | POA: Diagnosis not present

## 2015-06-16 LAB — COMPREHENSIVE METABOLIC PANEL
ALT: 25 U/L (ref 17–63)
ANION GAP: 8 (ref 5–15)
AST: 62 U/L — AB (ref 15–41)
Albumin: 2.7 g/dL — ABNORMAL LOW (ref 3.5–5.0)
Alkaline Phosphatase: 44 U/L (ref 38–126)
BUN: 8 mg/dL (ref 6–20)
CHLORIDE: 107 mmol/L (ref 101–111)
CO2: 24 mmol/L (ref 22–32)
Calcium: 8.3 mg/dL — ABNORMAL LOW (ref 8.9–10.3)
Creatinine, Ser: 0.99 mg/dL (ref 0.61–1.24)
Glucose, Bld: 141 mg/dL — ABNORMAL HIGH (ref 65–99)
POTASSIUM: 3.9 mmol/L (ref 3.5–5.1)
Sodium: 139 mmol/L (ref 135–145)
TOTAL PROTEIN: 5.1 g/dL — AB (ref 6.5–8.1)
Total Bilirubin: 0.8 mg/dL (ref 0.3–1.2)

## 2015-06-16 LAB — CBC
HEMATOCRIT: 32.9 % — AB (ref 39.0–52.0)
Hemoglobin: 10.5 g/dL — ABNORMAL LOW (ref 13.0–17.0)
MCH: 30.3 pg (ref 26.0–34.0)
MCHC: 31.9 g/dL (ref 30.0–36.0)
MCV: 95.1 fL (ref 78.0–100.0)
Platelets: 101 10*3/uL — ABNORMAL LOW (ref 150–400)
RBC: 3.46 MIL/uL — AB (ref 4.22–5.81)
RDW: 12.9 % (ref 11.5–15.5)
WBC: 9.4 10*3/uL (ref 4.0–10.5)

## 2015-06-16 LAB — GLUCOSE, CAPILLARY: Glucose-Capillary: 126 mg/dL — ABNORMAL HIGH (ref 65–99)

## 2015-06-16 MED ORDER — PANTOPRAZOLE SODIUM 40 MG PO TBEC: 40.0000 mg | DELAYED_RELEASE_TABLET | Freq: Every day | ORAL | Status: DC

## 2015-06-16 MED ORDER — PREDNISONE 5 MG PO TABS: ORAL_TABLET | ORAL | Status: DC

## 2015-06-16 MED ORDER — OSELTAMIVIR PHOSPHATE 75 MG PO CAPS: 75.0000 mg | ORAL_CAPSULE | Freq: Two times a day (BID) | ORAL | Status: DC

## 2015-06-16 MED ORDER — GUAIFENESIN-DM 100-10 MG/5ML PO SYRP: 5.0000 mL | ORAL_SOLUTION | ORAL | Status: DC | PRN

## 2015-06-16 MED ORDER — NICOTINE 14 MG/24HR TD PT24: 14.0000 mg | MEDICATED_PATCH | Freq: Every day | TRANSDERMAL | Status: DC | PRN

## 2015-06-16 NOTE — Progress Notes (Signed)
Discharge instruction and medications discussed with patient.  Prescription and eye drops given to patient. All questions answered.

## 2015-06-16 NOTE — Progress Notes (Signed)
O2 sats 96-98% while ambulating 50 ft on room air.  Patient tolerated well.

## 2015-06-16 NOTE — Discharge Summary (Signed)
Physician Discharge Summary  Oscar Greene MRN: [id number] DOB/AGE: [birth year] 66 y.o.  PCP: Odette Fraction, MD   Admit date: 06/13/2015 Discharge date: 06/16/2015  Discharge Diagnoses:     Principal Problem:   Dehydration Active Problems:   Hypertension   Smoker   AKI (acute kidney injury) ([place])   Viral URI with cough   Thrombocytopenia (HCC)   Elevated troponin   Generalized weakness    Follow-up recommendations Follow-up with PCP in 3-5 days , including all  additional recommended appointments as below Follow-up CBC, CMP in 3-5 days No indication for home oxygen prior to discharge, patient found to be about 95% on room air with ambulation     Medication List    STOP taking these medications        losartan-hydrochlorothiazide 50-12.5 MG tablet  Commonly known as:  HYZAAR      TAKE these medications        cloNIDine 0.1 MG tablet  Commonly known as:  CATAPRES  TAKE 1 TABLET TWICE A DAY     clopidogrel 75 MG tablet  Commonly known as:  PLAVIX  TAKE 1 TABLET (75 MG TOTAL) BY MOUTH DAILY.     dorzolamide-timolol 22.3-6.8 MG/ML ophthalmic solution  Commonly known as:  COSOPT  1 DROP IN BOTH EYES TWICE A DAY     guaiFENesin-dextromethorphan 100-10 MG/5ML syrup  Commonly known as:  ROBITUSSIN DM  Take 5 mLs by mouth every 4 (four) hours as needed for cough.     nicotine 14 mg/24hr patch  Commonly known as:  NICODERM CQ - dosed in mg/24 hours  Place 1 patch (14 mg total) onto the skin daily as needed (nicotine cravings).     oseltamivir 75 MG capsule  Commonly known as:  TAMIFLU  Take 1 capsule (75 mg total) by mouth 2 (two) times daily.     pantoprazole 40 MG tablet  Commonly known as:  PROTONIX  Take 1 tablet (40 mg total) by mouth daily.     predniSONE 5 MG tablet  Commonly known as:  DELTASONE  10 tablets 3 days, 9 tablets 3 days, 8 tablets 3 days, 7 tablets 3 days, 6 tablets 3 days, 5 tablets 3 days, 4 tablets 3 days, 3 tablets 3  days, 2 tablets 3 days, 1 tablet 3 days         Discharge Condition:   Stable    Discharge Instructions Get Medicines reviewed and adjusted: Please take all your medications with you for your next visit with your Primary MD  Please request your Primary MD to go over all hospital tests and procedure/radiological results at the follow up, please ask your Primary MD to get all Hospital records sent to his/her office.  If you experience worsening of your admission symptoms, develop shortness of breath, life threatening emergency, suicidal or homicidal thoughts you must seek medical attention immediately by calling 911 or calling your MD immediately if symptoms less severe.  You must read complete instructions/literature along with all the possible adverse reactions/side effects for all the Medicines you take and that have been prescribed to you. Take any new Medicines after you have completely understood and accpet all the possible adverse reactions/side effects.   Do not drive when taking Pain medications.   Do not take more than prescribed Pain, Sleep and Anxiety Medications  Special Instructions: If you have smoked or chewed Tobacco in the last 2 yrs please stop smoking, stop any regular Alcohol and or any Recreational drug use.  Wear Seat belts while driving.  Please note  You were cared for by a hospitalist during your hospital stay. Once you are discharged, your primary care physician will handle any further medical issues. Please note that NO REFILLS for any discharge medications will be authorized once you are discharged, as it is imperative that you return to your primary care physician (or establish a relationship with a primary care physician if you do not have one) for your aftercare needs so that they can reassess your need for medications and monitor your lab values.      Discharge Instructions    Diet - low sodium heart healthy    Complete by:  As directed       Increase activity slowly    Complete by:  As directed            No Known Allergies    Disposition: 01-Home or Self Care   Consults:  None    Significant Diagnostic Studies:  Dg Chest 2 View  06/13/2015  CLINICAL DATA:  65 year old male with cough, near syncope and hypotension. EXAM: CHEST  2 VIEW COMPARISON:  11/17/2013 chest radiograph FINDINGS: The cardiomediastinal silhouette is unremarkable. There is no evidence of focal airspace disease, pulmonary edema, suspicious pulmonary nodule/mass, pleural effusion, or pneumothorax. No acute bony abnormalities are identified. IMPRESSION: No active cardiopulmonary disease. Electronically Signed   By: Margarette Canada M.D.   On: 06/13/2015 15:38    2-D echo-EF 55-60% ------------------------------------------------------------------- Indications: Chest pain 786.51.  ------------------------------------------------------------------- History: PMH: Cocaine abuse. Stroke. Risk factors: Current tobacco use. Hypertension.  ------------------------------------------------------------------- Study Conclusions  - Left ventricle: The cavity size was normal. Wall thickness was  increased in a pattern of mild LVH. Systolic function was normal.  The estimated ejection fraction was in the range of 55% to 60%.  Wall motion was normal; there were no regional wall motion  abnormalities. Doppler parameters are consistent with abnormal  left ventricular relaxation (grade 1 diastolic dysfunction). - Pulmonary arteries: Systolic pressure was moderately increased.  PA peak pressure: 52 mm Hg (S).  Impressions:  - Normal LV systolic function; grade 1 diastolic dysfunction; trace  MR; mild TR with moderately elevated pulmonary pressure.    Filed Weights   06/15/15 2144  Weight: 69.2 kg (152 lb 8.9 oz)     Microbiology: No results found for this or any previous visit (from the past 240 hour(s)).     Blood Culture     Component Value Date/Time   SDES URINE, RANDOM 11/17/2013 [place] ADDED [id number] 11/17/2013 1356   CULT NO GROWTH Performed at [facility] 11/17/2013 1356   REPTSTATUS 11/19/2013 FINAL 11/17/2013 1356      Labs: Results for orders placed or performed during the hospital encounter of 06/13/15 (from the past 48 hour(s))  Procalcitonin     Status: None   Collection Time: 06/15/15  6:29 AM  Result Value Ref Range   Procalcitonin 0.14 ng/mL    Comment:        Interpretation: PCT (Procalcitonin) <= 0.5 ng/mL: Systemic infection (sepsis) is not likely. Local bacterial infection is possible. (NOTE)         ICU PCT Algorithm               Non ICU PCT Algorithm    ----------------------------     ------------------------------         PCT < 0.25 ng/mL  PCT < 0.1 ng/mL     Stopping of antibiotics            Stopping of antibiotics       strongly encouraged.               strongly encouraged.    ----------------------------     ------------------------------       PCT level decrease by               PCT < 0.25 ng/mL       >= 80% from peak PCT       OR PCT 0.25 - 0.5 ng/mL          Stopping of antibiotics                                             encouraged.     Stopping of antibiotics           encouraged.    ----------------------------     ------------------------------       PCT level decrease by              PCT >= 0.25 ng/mL       < 80% from peak PCT        AND PCT >= 0.5 ng/mL            Continuin g antibiotics                                              encouraged.       Continuing antibiotics            encouraged.    ----------------------------     ------------------------------     PCT level increase compared          PCT > 0.5 ng/mL         with peak PCT AND          PCT >= 0.5 ng/mL             Escalation of antibiotics                                          strongly encouraged.      Escalation of antibiotics         strongly encouraged.   Comprehensive metabolic panel     Status: Abnormal   Collection Time: 06/15/15  6:29 AM  Result Value Ref Range   Sodium 140 135 - 145 mmol/L   Potassium 3.5 3.5 - 5.1 mmol/L   Chloride 109 101 - 111 mmol/L   CO2 25 22 - 32 mmol/L   Glucose, Bld 96 65 - 99 mg/dL   BUN 9 6 - 20 mg/dL   Creatinine, Ser 1.09 0.61 - 1.24 mg/dL   Calcium 7.8 (L) 8.9 - 10.3 mg/dL   Total Protein 5.1 (L) 6.5 - 8.1 g/dL   Albumin 2.6 (L) 3.5 - 5.0 g/dL   AST 62 (H) 15 - 41 U/L   ALT 21 17 - 63 U/L   Alkaline Phosphatase 41 38 - 126 U/L   Total Bilirubin 1.1 0.3 - 1.2 mg/dL  GFR calc non Af Amer >60 >60 mL/min   GFR calc Af Amer >60 >60 mL/min    Comment: (NOTE) The eGFR has been calculated using the CKD EPI equation. This calculation has not been validated in all clinical situations. eGFR's persistently <60 mL/min signify possible Chronic Kidney Disease.    Anion gap 6 5 - 15  CBC     Status: Abnormal   Collection Time: 06/15/15  6:29 AM  Result Value Ref Range   WBC 5.6 4.0 - 10.5 K/uL   RBC 3.43 (L) 4.22 - 5.81 MIL/uL   Hemoglobin 10.5 (L) 13.0 - 17.0 g/dL   HCT 32.7 (L) 39.0 - 52.0 %   MCV 95.3 78.0 - 100.0 fL   MCH 30.6 26.0 - 34.0 pg   MCHC 32.1 30.0 - 36.0 g/dL   RDW 13.2 11.5 - 15.5 %   Platelets 95 (L) 150 - 400 K/uL    Comment: CONSISTENT WITH PREVIOUS RESULT  Glucose, capillary     Status: Abnormal   Collection Time: 06/15/15  8:22 AM  Result Value Ref Range   Glucose-Capillary 101 (H) 65 - 99 mg/dL  Comprehensive metabolic panel     Status: Abnormal   Collection Time: 06/16/15  4:05 AM  Result Value Ref Range   Sodium 139 135 - 145 mmol/L   Potassium 3.9 3.5 - 5.1 mmol/L   Chloride 107 101 - 111 mmol/L   CO2 24 22 - 32 mmol/L   Glucose, Bld 141 (H) 65 - 99 mg/dL   BUN 8 6 - 20 mg/dL   Creatinine, Ser 0.99 0.61 - 1.24 mg/dL   Calcium 8.3 (L) 8.9 - 10.3 mg/dL   Total Protein 5.1 (L) 6.5 - 8.1 g/dL   Albumin 2.7 (L) 3.5 - 5.0 g/dL   AST 62 (H) 15 - 41  U/L   ALT 25 17 - 63 U/L   Alkaline Phosphatase 44 38 - 126 U/L   Total Bilirubin 0.8 0.3 - 1.2 mg/dL   GFR calc non Af Amer >60 >60 mL/min   GFR calc Af Amer >60 >60 mL/min    Comment: (NOTE) The eGFR has been calculated using the CKD EPI equation. This calculation has not been validated in all clinical situations. eGFR's persistently <60 mL/min signify possible Chronic Kidney Disease.    Anion gap 8 5 - 15  CBC     Status: Abnormal   Collection Time: 06/16/15  4:05 AM  Result Value Ref Range   WBC 9.4 4.0 - 10.5 K/uL   RBC 3.46 (L) 4.22 - 5.81 MIL/uL   Hemoglobin 10.5 (L) 13.0 - 17.0 g/dL    Comment: CONSISTENT WITH PREVIOUS RESULT   HCT 32.9 (L) 39.0 - 52.0 %   MCV 95.1 78.0 - 100.0 fL   MCH 30.3 26.0 - 34.0 pg   MCHC 31.9 30.0 - 36.0 g/dL   RDW 12.9 11.5 - 15.5 %   Platelets 101 (L) 150 - 400 K/uL    Comment: CONSISTENT WITH PREVIOUS RESULT  Glucose, capillary     Status: Abnormal   Collection Time: 06/16/15  7:42 AM  Result Value Ref Range   Glucose-Capillary 126 (H) 65 - 99 mg/dL     Lipid Panel     Component Value Date/Time   CHOL 137 01/01/2015 0810   TRIG 114 01/01/2015 0810   HDL 38* 01/01/2015 0810   CHOLHDL 3.6 01/01/2015 0810   VLDL 23 01/01/2015 0810   LDLCALC 76 01/01/2015 0810  Lab Results  Component Value Date   HGBA1C 5.0 11/18/2013     Lab Results  Component Value Date   LDLCALC 76 01/01/2015   CREATININE 0.99 06/16/2015     Oscar Greene is a 65 y.o. male with PMH of hypertension, glaucoma, and tobacco abuse who presents to the ED with 4 days of sore throat, nonproductive cough, sinus congestion, malaise, and decreased appetite. Patient's symptoms have continued to progress over the past 4 days, culminating in a near syncopal episode today that prompted his visit to the ED. Patient lives alone, but notes a sick contact in the workplace who had URI symptoms with cough. Patient denies fevers, but endorses chills. He denies chest  pain, palpitations, nausea, vomiting, or diarrhea. 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 Patient's sister brought him some Coricidin D which she has been using with no appreciable improvement. He has had decreased appetite and decreased oral intake during this illness.  In ED, patient was found to be afebrile, saturating adequately on room air, with blood pressure 106/71 and normal pulse and respiratory rates. EKG was obtained and demonstrates a sinus rhythm with LVH by voltage criteria and nonspecific ST-T wave changes in the septal leads. Chest x-ray is negative for acute cardiopulmonary disease. Troponin is mildly elevated to 0.06. Urine is obtained for analysis and suggestive of dehydration, but grossly negative for infection. CMP features a serum creatinine of 2.25, up from an apparent baseline of 1.1. There is a mild elevation in total bilirubin 1.4 which is new from prior labs. CBC features a thrombocytopenia with platelet count 100,000. Patient was bolused with 1.5 L normal saline in the emergency department and reported some subjective improvement with this. Blood pressure improved after the fluid bolus, patient remained hemodynamically stable, and will be admitted for ongoing evaluation and management of dehydration with AKI suspected secondary to acute viral URI.  Assessment and plan  1. Dehydration with AKI  - SCr is 2.25>0.99 on admission, improving, follow renal function - Suspect this is multifactorial, primarily driven by dehydration in the setting of acute URI with decreased PO intake, exacerbated by ARB and HCTZ use Continue IV fluids - Hold losartan-HCTZ pending resolution of flulike symptoms     2. Acute bronchitis secondary to influenza B -- Flu PCR positive for influenza B, started on Tamiflu, continue Tamiflu for another 3 days - Pt declined the influenza vaccine this yr per PCP notes  Continue prednisone  taper, bronchodilators treatments  3. Chronic hypertension  - BP currently on the lower side in setting of dehydration  - Holding losartan, HCTZ pending resolution of acute on chronic bronchitis Patient given IV fluids Currently normotensive    4. Thrombocytopenia  - Platelet 100,000 on admission , trending down, DC Lovenox SCDs for DVT prophylaxis - Had mild thrombocytopenia in 2015  Follow CBC   5. Elevation in cardiac biomarker - Troponin 0.06 on admission - No CP; non-specific ST-T change on EKG - Suspect this to be a demand ischemia in setting of dehydration, low BPs on arrival  - Telemetry uneventful, EKG without ischemic changes t  Continue Plavix which is his home medication , 2-D echo to rule out wall motion abnormalities   6. Bilirubin elevation  - Mild elevation to 1.4 on arrival, suspected secondary to acute illness with dehydration , CMP shows improvement  - No RUQ pain or sxs suggestive of significant liver pathology  Discharge Exam:  Blood pressure 146/82, pulse 73, temperature 97.8 F (36.6 C), temperature source Oral, resp. rate 18, weight 69.2 kg (152 lb 8.9 oz), SpO2 95 %. General: No acute respiratory distress Lungs: Wheezing Cardiovascular: Regular rate and rhythm without murmur gallop or rub normal S1 and S2 Abdomen: Nontender, nondistended, soft, bowel sounds positive, no rebound, no ascites, no appreciable mass Extremities: No significant cyanosis, clubbing, or edema bilateral lower extremities     Follow-up Information    Follow up with [facility] TOM, MD. Schedule an appointment as soon as possible for a visit in 3 days.   Specialty:  Family Medicine   Contact information:   [address] [place] [address] [phone]       Signed: Reyne Dumas 06/16/2015, 1:59 PM        Time spent >45 mins

## 2015-06-22 ENCOUNTER — Other Ambulatory Visit: Payer: Self-pay | Admitting: Family Medicine

## 2015-06-22 ENCOUNTER — Encounter: Payer: Self-pay | Admitting: Family Medicine

## 2015-06-22 ENCOUNTER — Ambulatory Visit (INDEPENDENT_AMBULATORY_CARE_PROVIDER_SITE_OTHER): Payer: Medicare Other | Admitting: Family Medicine

## 2015-06-22 VITALS — BP 120/70 | HR 72 | Temp 97.9°F | Resp 18 | Ht 72.0 in | Wt 145.0 lb

## 2015-06-22 DIAGNOSIS — Z09 Encounter for follow-up examination after completed treatment for conditions other than malignant neoplasm: Secondary | ICD-10-CM | POA: Diagnosis not present

## 2015-06-22 DIAGNOSIS — R918 Other nonspecific abnormal finding of lung field: Secondary | ICD-10-CM

## 2015-06-22 DIAGNOSIS — I10 Essential (primary) hypertension: Secondary | ICD-10-CM | POA: Diagnosis not present
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
## 2015-06-23 ENCOUNTER — Ambulatory Visit
Admission: RE | Admit: 2015-06-23 | Discharge: 2015-06-23 | Disposition: A | Discharge status: Home / Self Care | Payer: Medicare Other | Source: Ambulatory Visit | Attending: Family Medicine | Admitting: Family Medicine

## 2015-06-23 DIAGNOSIS — R918 Other nonspecific abnormal finding of lung field: Secondary | ICD-10-CM

## 2015-06-23 DIAGNOSIS — J9811 Atelectasis: Secondary | ICD-10-CM | POA: Diagnosis not present

## 2015-06-23 LAB — COMPLETE METABOLIC PANEL WITH GFR
ALT: 33 U/L (ref 9–46)
AST: 26 U/L (ref 10–35)
Albumin: 3.4 g/dL — ABNORMAL LOW (ref 3.6–5.1)
Alkaline Phosphatase: 54 U/L (ref 40–115)
BILIRUBIN TOTAL: 0.8 mg/dL (ref 0.2–1.2)
BUN: 20 mg/dL (ref 7–25)
CALCIUM: 8.6 mg/dL (ref 8.6–10.3)
CHLORIDE: 103 mmol/L (ref 98–110)
CO2: 23 mmol/L (ref 20–31)
CREATININE: 1.19 mg/dL (ref 0.70–1.25)
GFR, Est African American: 73 mL/min (ref >=60)
GFR, Est Non African American: 63 mL/min (ref >=60)
Glucose, Bld: 88 mg/dL (ref 70–99)
Potassium: 5.2 mmol/L (ref 3.5–5.3)
Sodium: 140 mmol/L (ref 135–146)
TOTAL PROTEIN: 6.5 g/dL (ref 6.1–8.1)

## 2015-06-23 LAB — CBC WITH DIFFERENTIAL/PLATELET
BASOS ABS: 69 {cells}/uL (ref 0–200)
Basophils Relative: 1 %
EOS PCT: 4 %
Eosinophils Absolute: 276 cells/uL (ref 15–500)
HCT: 36.8 % — ABNORMAL LOW (ref 38.5–50.0)
Hemoglobin: 11.8 g/dL — ABNORMAL LOW (ref 13.0–17.0)
LYMPHS PCT: 23 %
Lymphs Abs: 1587 cells/uL (ref 850–3900)
MCH: 31 pg (ref 27.0–33.0)
MCHC: 32.1 g/dL (ref 32.0–36.0)
MCV: 96.6 fL (ref 80.0–100.0)
MONOS PCT: 9 %
MPV: 10.6 fL (ref 7.5–12.5)
Monocytes Absolute: 621 cells/uL (ref 200–950)
NEUTROS ABS: 4347 {cells}/uL (ref 1500–7800)
NEUTROS PCT: 63 %
PLATELETS: 447 10*3/uL — AB (ref 140–400)
RBC: 3.81 MIL/uL — ABNORMAL LOW (ref 4.20–5.80)
RDW: 13.3 % (ref 11.0–15.0)
WBC: 6.9 10*3/uL (ref 3.8–10.8)

## 2015-06-24 ENCOUNTER — Encounter: Payer: Self-pay | Admitting: *Deleted

## 2015-07-06 DIAGNOSIS — H26493 Other secondary cataract, bilateral: Secondary | ICD-10-CM | POA: Diagnosis not present

## 2015-07-06 DIAGNOSIS — Z961 Presence of intraocular lens: Secondary | ICD-10-CM | POA: Diagnosis not present

## 2015-07-06 DIAGNOSIS — H401133 Primary open-angle glaucoma, bilateral, severe stage: Secondary | ICD-10-CM | POA: Diagnosis not present

## 2015-07-06 DIAGNOSIS — H353132 Nonexudative age-related macular degeneration, bilateral, intermediate dry stage: Secondary | ICD-10-CM | POA: Diagnosis not present

## 2015-08-04 ENCOUNTER — Other Ambulatory Visit: Payer: Self-pay | Admitting: Family Medicine

## 2015-08-04 MED ORDER — CLONIDINE HCL 0.1 MG PO TABS: 0.1000 mg | ORAL_TABLET | Freq: Two times a day (BID) | ORAL | Status: DC

## 2015-08-12 DIAGNOSIS — H34832 Tributary (branch) retinal vein occlusion, left eye, with macular edema: Secondary | ICD-10-CM | POA: Diagnosis not present

## 2015-09-23 DIAGNOSIS — H34832 Tributary (branch) retinal vein occlusion, left eye, with macular edema: Secondary | ICD-10-CM | POA: Diagnosis not present

## 2015-11-30 DIAGNOSIS — H34832 Tributary (branch) retinal vein occlusion, left eye, with macular edema: Secondary | ICD-10-CM | POA: Diagnosis not present

## 2015-12-08 ENCOUNTER — Other Ambulatory Visit: Payer: Self-pay | Admitting: Family Medicine

## 2015-12-08 MED ORDER — CLONIDINE HCL 0.1 MG PO TABS: 0.1000 mg | ORAL_TABLET | Freq: Two times a day (BID) | ORAL | 2 refills | Status: DC

## 2016-01-13 DIAGNOSIS — H34832 Tributary (branch) retinal vein occlusion, left eye, with macular edema: Secondary | ICD-10-CM | POA: Diagnosis not present

## 2016-01-28 DIAGNOSIS — H401133 Primary open-angle glaucoma, bilateral, severe stage: Secondary | ICD-10-CM | POA: Diagnosis not present

## 2016-01-28 DIAGNOSIS — H348322 Tributary (branch) retinal vein occlusion, left eye, stable: Secondary | ICD-10-CM | POA: Diagnosis not present

## 2016-01-28 DIAGNOSIS — Z961 Presence of intraocular lens: Secondary | ICD-10-CM | POA: Diagnosis not present

## 2016-02-24 DIAGNOSIS — H34832 Tributary (branch) retinal vein occlusion, left eye, with macular edema: Secondary | ICD-10-CM | POA: Diagnosis not present

## 2016-03-24 DIAGNOSIS — H348322 Tributary (branch) retinal vein occlusion, left eye, stable: Secondary | ICD-10-CM | POA: Diagnosis not present

## 2016-03-24 DIAGNOSIS — Z961 Presence of intraocular lens: Secondary | ICD-10-CM | POA: Diagnosis not present

## 2016-03-24 DIAGNOSIS — H401133 Primary open-angle glaucoma, bilateral, severe stage: Secondary | ICD-10-CM | POA: Diagnosis not present

## 2016-04-06 DIAGNOSIS — H34832 Tributary (branch) retinal vein occlusion, left eye, with macular edema: Secondary | ICD-10-CM | POA: Diagnosis not present

## 2016-04-17 ENCOUNTER — Other Ambulatory Visit: Payer: Self-pay | Admitting: Family Medicine

## 2016-05-18 ENCOUNTER — Other Ambulatory Visit: Payer: Self-pay | Admitting: *Deleted

## 2016-05-18 MED ORDER — CLOPIDOGREL BISULFATE 75 MG PO TABS: ORAL_TABLET | ORAL | 0 refills | Status: DC

## 2016-05-18 MED ORDER — CLONIDINE HCL 0.1 MG PO TABS: 0.1000 mg | ORAL_TABLET | Freq: Two times a day (BID) | ORAL | 0 refills | Status: DC

## 2016-05-25 DIAGNOSIS — H34832 Tributary (branch) retinal vein occlusion, left eye, with macular edema: Secondary | ICD-10-CM | POA: Diagnosis not present

## 2016-05-25 DIAGNOSIS — H35352 Cystoid macular degeneration, left eye: Secondary | ICD-10-CM | POA: Diagnosis not present

## 2016-07-13 DIAGNOSIS — H34832 Tributary (branch) retinal vein occlusion, left eye, with macular edema: Secondary | ICD-10-CM | POA: Diagnosis not present

## 2016-07-28 DIAGNOSIS — H401133 Primary open-angle glaucoma, bilateral, severe stage: Secondary | ICD-10-CM | POA: Diagnosis not present

## 2016-08-13 ENCOUNTER — Other Ambulatory Visit: Payer: Self-pay | Admitting: Family Medicine

## 2016-08-17 DIAGNOSIS — H401133 Primary open-angle glaucoma, bilateral, severe stage: Secondary | ICD-10-CM | POA: Diagnosis not present

## 2016-08-17 DIAGNOSIS — H3562 Retinal hemorrhage, left eye: Secondary | ICD-10-CM | POA: Diagnosis not present

## 2016-08-17 DIAGNOSIS — H4720 Unspecified optic atrophy: Secondary | ICD-10-CM | POA: Diagnosis not present

## 2016-08-17 DIAGNOSIS — H34832 Tributary (branch) retinal vein occlusion, left eye, with macular edema: Secondary | ICD-10-CM | POA: Diagnosis not present

## 2016-08-17 DIAGNOSIS — H35352 Cystoid macular degeneration, left eye: Secondary | ICD-10-CM | POA: Diagnosis not present

## 2016-09-08 DIAGNOSIS — H401133 Primary open-angle glaucoma, bilateral, severe stage: Secondary | ICD-10-CM | POA: Diagnosis not present

## 2016-09-28 DIAGNOSIS — H35072 Retinal telangiectasis, left eye: Secondary | ICD-10-CM | POA: Diagnosis not present

## 2016-09-28 DIAGNOSIS — H35352 Cystoid macular degeneration, left eye: Secondary | ICD-10-CM | POA: Diagnosis not present

## 2016-09-28 DIAGNOSIS — H401193 Primary open-angle glaucoma, unspecified eye, severe stage: Secondary | ICD-10-CM | POA: Diagnosis not present

## 2016-09-28 DIAGNOSIS — H4720 Unspecified optic atrophy: Secondary | ICD-10-CM | POA: Diagnosis not present

## 2016-09-28 DIAGNOSIS — H34832 Tributary (branch) retinal vein occlusion, left eye, with macular edema: Secondary | ICD-10-CM | POA: Diagnosis not present

## 2016-11-10 ENCOUNTER — Other Ambulatory Visit: Payer: Self-pay | Admitting: Family Medicine

## 2016-11-16 DIAGNOSIS — H401193 Primary open-angle glaucoma, unspecified eye, severe stage: Secondary | ICD-10-CM | POA: Diagnosis not present

## 2016-11-16 DIAGNOSIS — H34832 Tributary (branch) retinal vein occlusion, left eye, with macular edema: Secondary | ICD-10-CM | POA: Diagnosis not present

## 2016-11-16 DIAGNOSIS — H4720 Unspecified optic atrophy: Secondary | ICD-10-CM | POA: Diagnosis not present

## 2016-11-16 DIAGNOSIS — H35352 Cystoid macular degeneration, left eye: Secondary | ICD-10-CM | POA: Diagnosis not present

## 2016-11-16 DIAGNOSIS — H35072 Retinal telangiectasis, left eye: Secondary | ICD-10-CM | POA: Diagnosis not present

## 2016-12-07 DIAGNOSIS — Z961 Presence of intraocular lens: Secondary | ICD-10-CM | POA: Diagnosis not present

## 2016-12-07 DIAGNOSIS — H401133 Primary open-angle glaucoma, bilateral, severe stage: Secondary | ICD-10-CM | POA: Diagnosis not present

## 2016-12-07 DIAGNOSIS — H26491 Other secondary cataract, right eye: Secondary | ICD-10-CM | POA: Diagnosis not present

## 2016-12-07 DIAGNOSIS — H348322 Tributary (branch) retinal vein occlusion, left eye, stable: Secondary | ICD-10-CM | POA: Diagnosis not present

## 2016-12-28 DIAGNOSIS — H34832 Tributary (branch) retinal vein occlusion, left eye, with macular edema: Secondary | ICD-10-CM | POA: Diagnosis not present

## 2016-12-28 DIAGNOSIS — H35352 Cystoid macular degeneration, left eye: Secondary | ICD-10-CM | POA: Diagnosis not present

## 2016-12-28 DIAGNOSIS — H4720 Unspecified optic atrophy: Secondary | ICD-10-CM | POA: Diagnosis not present

## 2017-02-07 ENCOUNTER — Other Ambulatory Visit: Payer: Self-pay | Admitting: Family Medicine

## 2017-02-11 ENCOUNTER — Other Ambulatory Visit: Payer: Self-pay | Admitting: Family Medicine

## 2017-02-22 DIAGNOSIS — H401193 Primary open-angle glaucoma, unspecified eye, severe stage: Secondary | ICD-10-CM | POA: Diagnosis not present

## 2017-02-22 DIAGNOSIS — H35352 Cystoid macular degeneration, left eye: Secondary | ICD-10-CM | POA: Diagnosis not present

## 2017-02-22 DIAGNOSIS — H4720 Unspecified optic atrophy: Secondary | ICD-10-CM | POA: Diagnosis not present

## 2017-02-22 DIAGNOSIS — H34832 Tributary (branch) retinal vein occlusion, left eye, with macular edema: Secondary | ICD-10-CM | POA: Diagnosis not present

## 2017-03-11 ENCOUNTER — Other Ambulatory Visit: Payer: Self-pay | Admitting: Family Medicine

## 2017-03-15 ENCOUNTER — Other Ambulatory Visit: Payer: Self-pay | Admitting: Family Medicine

## 2017-03-16 DIAGNOSIS — H401133 Primary open-angle glaucoma, bilateral, severe stage: Secondary | ICD-10-CM | POA: Diagnosis not present

## 2017-04-03 DIAGNOSIS — H34832 Tributary (branch) retinal vein occlusion, left eye, with macular edema: Secondary | ICD-10-CM | POA: Diagnosis not present

## 2017-04-03 DIAGNOSIS — H4720 Unspecified optic atrophy: Secondary | ICD-10-CM | POA: Diagnosis not present

## 2017-04-03 DIAGNOSIS — H35352 Cystoid macular degeneration, left eye: Secondary | ICD-10-CM | POA: Diagnosis not present

## 2017-04-15 ENCOUNTER — Other Ambulatory Visit: Payer: Self-pay | Admitting: Family Medicine

## 2017-04-25 ENCOUNTER — Ambulatory Visit (HOSPITAL_COMMUNITY)
Admission: EM | Admit: 2017-04-25 | Discharge: 2017-04-25 | Disposition: A | Discharge status: Home / Self Care | Payer: Medicare Other | Attending: Family Medicine | Admitting: Family Medicine

## 2017-04-25 ENCOUNTER — Other Ambulatory Visit: Payer: Self-pay

## 2017-04-25 ENCOUNTER — Ambulatory Visit (INDEPENDENT_AMBULATORY_CARE_PROVIDER_SITE_OTHER): Payer: Medicare Other

## 2017-04-25 ENCOUNTER — Encounter (HOSPITAL_COMMUNITY): Payer: Self-pay | Admitting: Emergency Medicine

## 2017-04-25 DIAGNOSIS — S62632B Displaced fracture of distal phalanx of right middle finger, initial encounter for open fracture: Secondary | ICD-10-CM | POA: Diagnosis not present

## 2017-04-25 DIAGNOSIS — S62642A Nondisplaced fracture of proximal phalanx of right middle finger, initial encounter for closed fracture: Secondary | ICD-10-CM | POA: Diagnosis not present

## 2017-04-25 HISTORY — DX: Essential (primary) hypertension: I10

## 2017-04-25 MED ORDER — CEFDINIR 300 MG PO CAPS: 300.0000 mg | ORAL_CAPSULE | Freq: Two times a day (BID) | ORAL | 0 refills | Status: DC

## 2017-04-25 NOTE — ED Triage Notes (Signed)
Pt hit R middle finger with wood splitter, unable to bend finger up, possible tendon involvement.

## 2017-04-26 DIAGNOSIS — S62632A Displaced fracture of distal phalanx of right middle finger, initial encounter for closed fracture: Secondary | ICD-10-CM | POA: Insufficient documentation

## 2017-04-27 DIAGNOSIS — S62632B Displaced fracture of distal phalanx of right middle finger, initial encounter for open fracture: Secondary | ICD-10-CM | POA: Diagnosis not present

## 2017-05-01 DIAGNOSIS — M79644 Pain in right finger(s): Secondary | ICD-10-CM | POA: Diagnosis not present
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
## 2017-05-10 ENCOUNTER — Other Ambulatory Visit: Payer: Self-pay | Admitting: Family Medicine

## 2017-05-15 ENCOUNTER — Other Ambulatory Visit: Payer: Self-pay | Admitting: Family Medicine

## 2017-05-15 DIAGNOSIS — H34832 Tributary (branch) retinal vein occlusion, left eye, with macular edema: Secondary | ICD-10-CM | POA: Diagnosis not present

## 2017-05-15 DIAGNOSIS — H401193 Primary open-angle glaucoma, unspecified eye, severe stage: Secondary | ICD-10-CM | POA: Diagnosis not present

## 2017-05-15 DIAGNOSIS — H35352 Cystoid macular degeneration, left eye: Secondary | ICD-10-CM | POA: Diagnosis not present

## 2017-05-21 ENCOUNTER — Encounter: Payer: Self-pay | Admitting: Family Medicine

## 2017-05-21 ENCOUNTER — Ambulatory Visit (INDEPENDENT_AMBULATORY_CARE_PROVIDER_SITE_OTHER): Payer: Medicare Other | Admitting: Family Medicine

## 2017-05-21 VITALS — BP 128/70 | HR 66 | Temp 97.6°F | Resp 16 | Ht 72.0 in | Wt 151.0 lb

## 2017-05-21 DIAGNOSIS — I639 Cerebral infarction, unspecified: Secondary | ICD-10-CM | POA: Diagnosis not present

## 2017-05-21 DIAGNOSIS — Z1211 Encounter for screening for malignant neoplasm of colon: Secondary | ICD-10-CM

## 2017-05-21 DIAGNOSIS — Z1159 Encounter for screening for other viral diseases: Secondary | ICD-10-CM | POA: Diagnosis not present

## 2017-05-21 DIAGNOSIS — Z23 Encounter for immunization: Secondary | ICD-10-CM

## 2017-05-21 DIAGNOSIS — Z125 Encounter for screening for malignant neoplasm of prostate: Secondary | ICD-10-CM

## 2017-05-21 DIAGNOSIS — I10 Essential (primary) hypertension: Secondary | ICD-10-CM
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
## 2017-05-21 NOTE — Addendum Note (Signed)
Addended by: Shary Decamp B on: 05/21/2017 12:05 PM   Modules accepted: Orders

## 2017-05-22 LAB — CBC WITH DIFFERENTIAL/PLATELET
BASOS PCT: 1.7 %
Basophils Absolute: 97 cells/uL (ref 0–200)
EOS PCT: 6.6 %
Eosinophils Absolute: 376 cells/uL (ref 15–500)
HEMATOCRIT: 40.5 % (ref 38.5–50.0)
Hemoglobin: 13.3 g/dL (ref 13.2–17.1)
LYMPHS ABS: 1368 {cells}/uL (ref 850–3900)
MCH: 31.2 pg (ref 27.0–33.0)
MCHC: 32.8 g/dL (ref 32.0–36.0)
MCV: 95.1 fL (ref 80.0–100.0)
MPV: 11.5 fL (ref 7.5–12.5)
Monocytes Relative: 8.5 %
NEUTROS ABS: 3374 {cells}/uL (ref 1500–7800)
NEUTROS PCT: 59.2 %
Platelets: 205 10*3/uL (ref 140–400)
RBC: 4.26 10*6/uL (ref 4.20–5.80)
RDW: 11.5 % (ref 11.0–15.0)
Total Lymphocyte: 24 %
WBC: 5.7 10*3/uL (ref 3.8–10.8)
WBCMIX: 485 {cells}/uL (ref 200–950)

## 2017-05-22 LAB — COMPLETE METABOLIC PANEL WITH GFR
AG RATIO: 1.6 (calc) (ref 1.0–2.5)
ALT: 9 U/L (ref 9–46)
AST: 14 U/L (ref 10–35)
Albumin: 3.9 g/dL (ref 3.6–5.1)
Alkaline phosphatase (APISO): 46 U/L (ref 40–115)
BUN/Creatinine Ratio: 20 (calc) (ref 6–22)
BUN: 26 mg/dL — ABNORMAL HIGH (ref 7–25)
CALCIUM: 8.8 mg/dL (ref 8.6–10.3)
CO2: 25 mmol/L (ref 20–32)
CREATININE: 1.31 mg/dL — AB (ref 0.70–1.25)
Chloride: 107 mmol/L (ref 98–110)
GFR, EST NON AFRICAN AMERICAN: 56 mL/min/{1.73_m2} — AB (ref >=60)
GFR, Est African American: 65 mL/min/{1.73_m2} (ref >=60)
GLOBULIN: 2.4 g/dL (ref 1.9–3.7)
Glucose, Bld: 98 mg/dL (ref 65–99)
Potassium: 4.4 mmol/L (ref 3.5–5.3)
SODIUM: 140 mmol/L (ref 135–146)
Total Bilirubin: 0.6 mg/dL (ref 0.2–1.2)
Total Protein: 6.3 g/dL (ref 6.1–8.1)

## 2017-05-22 LAB — LIPID PANEL
CHOL/HDL RATIO: 3.3 (calc) (ref <5.0)
Cholesterol: 115 mg/dL (ref <200)
HDL: 35 mg/dL — AB (ref >40)
LDL Cholesterol (Calc): 61 mg/dL (calc)
NON-HDL CHOLESTEROL (CALC): 80 mg/dL (ref <130)
TRIGLYCERIDES: 106 mg/dL (ref <150)

## 2017-05-22 LAB — HEPATITIS C ANTIBODY
HEP C AB: NONREACTIVE
SIGNAL TO CUT-OFF: 0.04 (ref <1.00)

## 2017-05-22 LAB — PSA: PSA: 4.2 ng/mL — AB (ref <=4.0)

## 2017-05-24 ENCOUNTER — Other Ambulatory Visit: Payer: Self-pay | Admitting: *Deleted

## 2017-05-24 DIAGNOSIS — R972 Elevated prostate specific antigen [PSA]: Secondary | ICD-10-CM

## 2017-05-25 ENCOUNTER — Encounter: Payer: Self-pay | Admitting: Gastroenterology

## 2017-06-11 ENCOUNTER — Other Ambulatory Visit: Payer: Self-pay | Admitting: Family Medicine

## 2017-06-12 ENCOUNTER — Other Ambulatory Visit: Payer: Self-pay | Admitting: Family Medicine

## 2017-06-20 DIAGNOSIS — T8469XA Infection and inflammatory reaction due to internal fixation device of other site, initial encounter: Secondary | ICD-10-CM | POA: Diagnosis not present

## 2017-06-20 DIAGNOSIS — Z472 Encounter for removal of internal fixation device: Secondary | ICD-10-CM | POA: Diagnosis not present

## 2017-06-25 DIAGNOSIS — H43811 Vitreous degeneration, right eye: Secondary | ICD-10-CM | POA: Diagnosis not present

## 2017-06-25 DIAGNOSIS — H35352 Cystoid macular degeneration, left eye: Secondary | ICD-10-CM | POA: Diagnosis not present

## 2017-06-25 DIAGNOSIS — H401193 Primary open-angle glaucoma, unspecified eye, severe stage: Secondary | ICD-10-CM | POA: Diagnosis not present

## 2017-06-25 DIAGNOSIS — H34832 Tributary (branch) retinal vein occlusion, left eye, with macular edema: Secondary | ICD-10-CM | POA: Diagnosis not present

## 2017-06-26 DIAGNOSIS — R972 Elevated prostate specific antigen [PSA]: Secondary | ICD-10-CM | POA: Diagnosis not present

## 2017-07-09 ENCOUNTER — Ambulatory Visit: Payer: Medicare Other | Admitting: Gastroenterology

## 2017-07-17 DIAGNOSIS — H401133 Primary open-angle glaucoma, bilateral, severe stage: Secondary | ICD-10-CM | POA: Diagnosis not present

## 2017-07-25 DIAGNOSIS — R972 Elevated prostate specific antigen [PSA]: Secondary | ICD-10-CM | POA: Diagnosis not present

## 2017-07-27 ENCOUNTER — Encounter: Payer: Self-pay | Admitting: Family Medicine

## 2017-08-01 DIAGNOSIS — C61 Malignant neoplasm of prostate: Secondary | ICD-10-CM | POA: Diagnosis not present

## 2017-08-06 ENCOUNTER — Encounter: Payer: Self-pay | Admitting: Family Medicine

## 2017-08-06 DIAGNOSIS — C61 Malignant neoplasm of prostate: Secondary | ICD-10-CM | POA: Insufficient documentation

## 2017-08-20 DIAGNOSIS — H34832 Tributary (branch) retinal vein occlusion, left eye, with macular edema: Secondary | ICD-10-CM | POA: Diagnosis not present

## 2017-08-20 DIAGNOSIS — H35352 Cystoid macular degeneration, left eye: Secondary | ICD-10-CM | POA: Diagnosis not present

## 2017-08-20 DIAGNOSIS — H4720 Unspecified optic atrophy: Secondary | ICD-10-CM | POA: Diagnosis not present

## 2017-08-20 DIAGNOSIS — H35072 Retinal telangiectasis, left eye: Secondary | ICD-10-CM | POA: Diagnosis not present

## 2017-10-01 DIAGNOSIS — H34832 Tributary (branch) retinal vein occlusion, left eye, with macular edema: Secondary | ICD-10-CM | POA: Diagnosis not present

## 2017-10-01 DIAGNOSIS — H35352 Cystoid macular degeneration, left eye: Secondary | ICD-10-CM | POA: Diagnosis not present

## 2017-11-09 DIAGNOSIS — Z961 Presence of intraocular lens: Secondary | ICD-10-CM | POA: Diagnosis not present

## 2017-11-09 DIAGNOSIS — H401133 Primary open-angle glaucoma, bilateral, severe stage: Secondary | ICD-10-CM | POA: Diagnosis not present

## 2017-11-19 DIAGNOSIS — H43812 Vitreous degeneration, left eye: Secondary | ICD-10-CM | POA: Diagnosis not present

## 2017-11-19 DIAGNOSIS — H35352 Cystoid macular degeneration, left eye: Secondary | ICD-10-CM | POA: Diagnosis not present

## 2017-11-19 DIAGNOSIS — H4720 Unspecified optic atrophy: Secondary | ICD-10-CM | POA: Diagnosis not present

## 2017-11-19 DIAGNOSIS — H34832 Tributary (branch) retinal vein occlusion, left eye, with macular edema: Secondary | ICD-10-CM | POA: Diagnosis not present

## 2017-11-19 DIAGNOSIS — H401193 Primary open-angle glaucoma, unspecified eye, severe stage: Secondary | ICD-10-CM | POA: Diagnosis not present

## 2018-01-07 DIAGNOSIS — H401193 Primary open-angle glaucoma, unspecified eye, severe stage: Secondary | ICD-10-CM | POA: Diagnosis not present

## 2018-01-07 DIAGNOSIS — H4720 Unspecified optic atrophy: Secondary | ICD-10-CM | POA: Diagnosis not present

## 2018-01-07 DIAGNOSIS — H43811 Vitreous degeneration, right eye: Secondary | ICD-10-CM | POA: Diagnosis not present

## 2018-01-07 DIAGNOSIS — H34832 Tributary (branch) retinal vein occlusion, left eye, with macular edema: Secondary | ICD-10-CM | POA: Diagnosis not present

## 2018-01-07 DIAGNOSIS — H35352 Cystoid macular degeneration, left eye: Secondary | ICD-10-CM | POA: Diagnosis not present

## 2018-02-05 DIAGNOSIS — H401133 Primary open-angle glaucoma, bilateral, severe stage: Secondary | ICD-10-CM | POA: Diagnosis not present

## 2018-02-05 DIAGNOSIS — Z961 Presence of intraocular lens: Secondary | ICD-10-CM | POA: Diagnosis not present

## 2018-02-05 DIAGNOSIS — H348322 Tributary (branch) retinal vein occlusion, left eye, stable: Secondary | ICD-10-CM | POA: Diagnosis not present

## 2018-02-18 DIAGNOSIS — H35352 Cystoid macular degeneration, left eye: Secondary | ICD-10-CM | POA: Diagnosis not present

## 2018-02-18 DIAGNOSIS — H401193 Primary open-angle glaucoma, unspecified eye, severe stage: Secondary | ICD-10-CM | POA: Diagnosis not present

## 2018-02-18 DIAGNOSIS — H35072 Retinal telangiectasis, left eye: Secondary | ICD-10-CM | POA: Diagnosis not present

## 2018-02-18 DIAGNOSIS — H34832 Tributary (branch) retinal vein occlusion, left eye, with macular edema: Secondary | ICD-10-CM | POA: Diagnosis not present

## 2018-02-18 DIAGNOSIS — H4720 Unspecified optic atrophy: Secondary | ICD-10-CM | POA: Diagnosis not present

## 2018-04-01 DIAGNOSIS — H34832 Tributary (branch) retinal vein occlusion, left eye, with macular edema: Secondary | ICD-10-CM | POA: Diagnosis not present

## 2018-04-01 DIAGNOSIS — H3562 Retinal hemorrhage, left eye: Secondary | ICD-10-CM | POA: Diagnosis not present

## 2018-04-01 DIAGNOSIS — H4720 Unspecified optic atrophy: Secondary | ICD-10-CM | POA: Diagnosis not present

## 2018-04-01 DIAGNOSIS — H35352 Cystoid macular degeneration, left eye: Secondary | ICD-10-CM | POA: Diagnosis not present

## 2018-04-01 DIAGNOSIS — H43812 Vitreous degeneration, left eye: Secondary | ICD-10-CM | POA: Diagnosis not present

## 2018-04-20 IMAGING — DX DG FINGER MIDDLE 2+V*R*
3 series · 3 of 3 positions shown · non-contrast
Comparison: None.

CLINICAL DATA: 66-year-old male status post right middle finger
injury while splitting Telesforo. Active bleeding.

EXAM:
RIGHT MIDDLE FINGER 2+V

[finger ap]
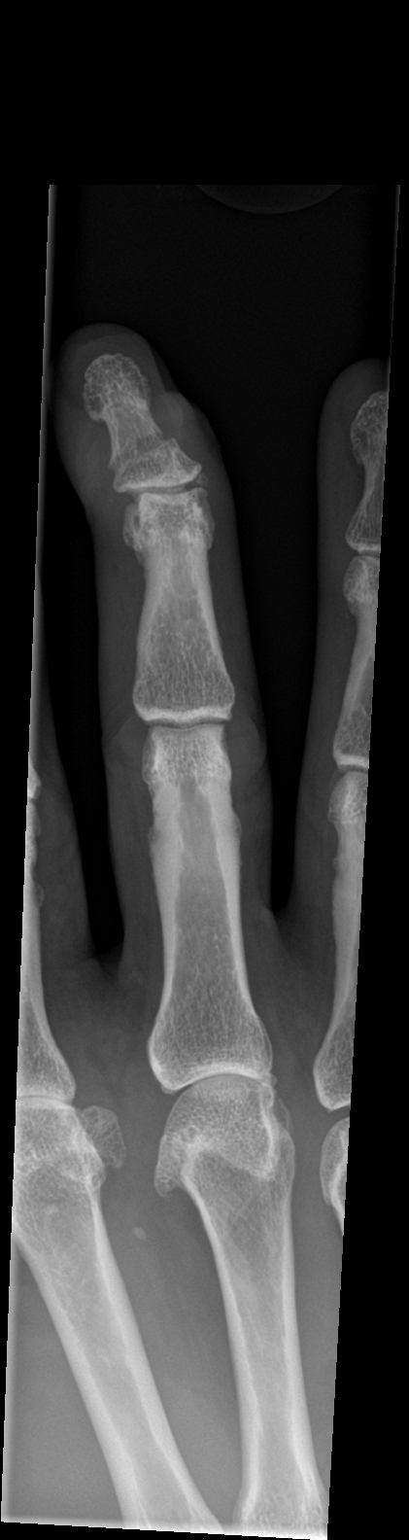

[finger obl]
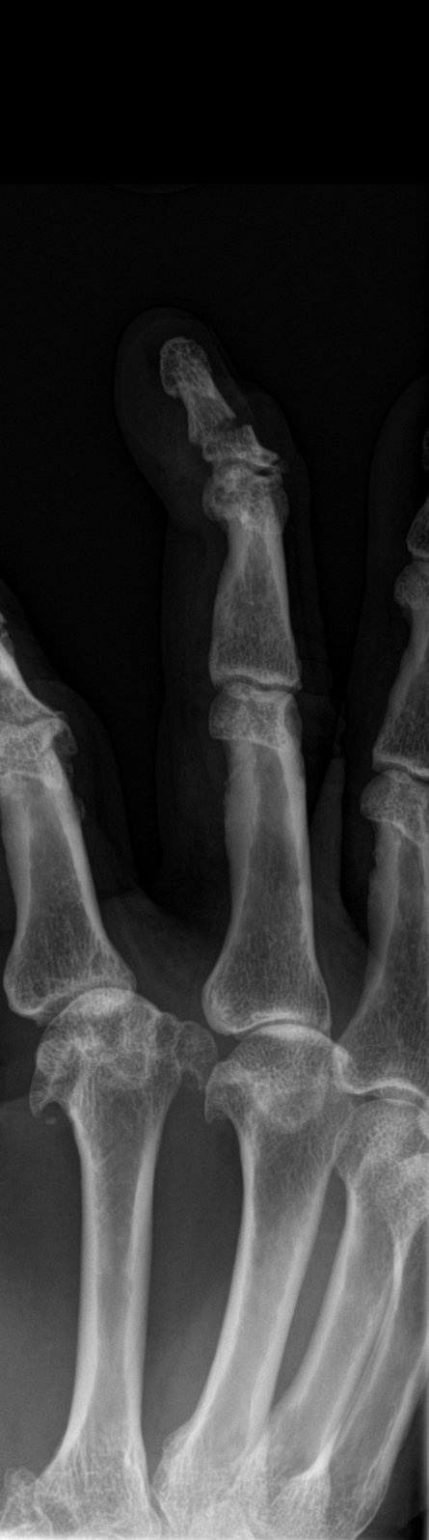

[finger lat]
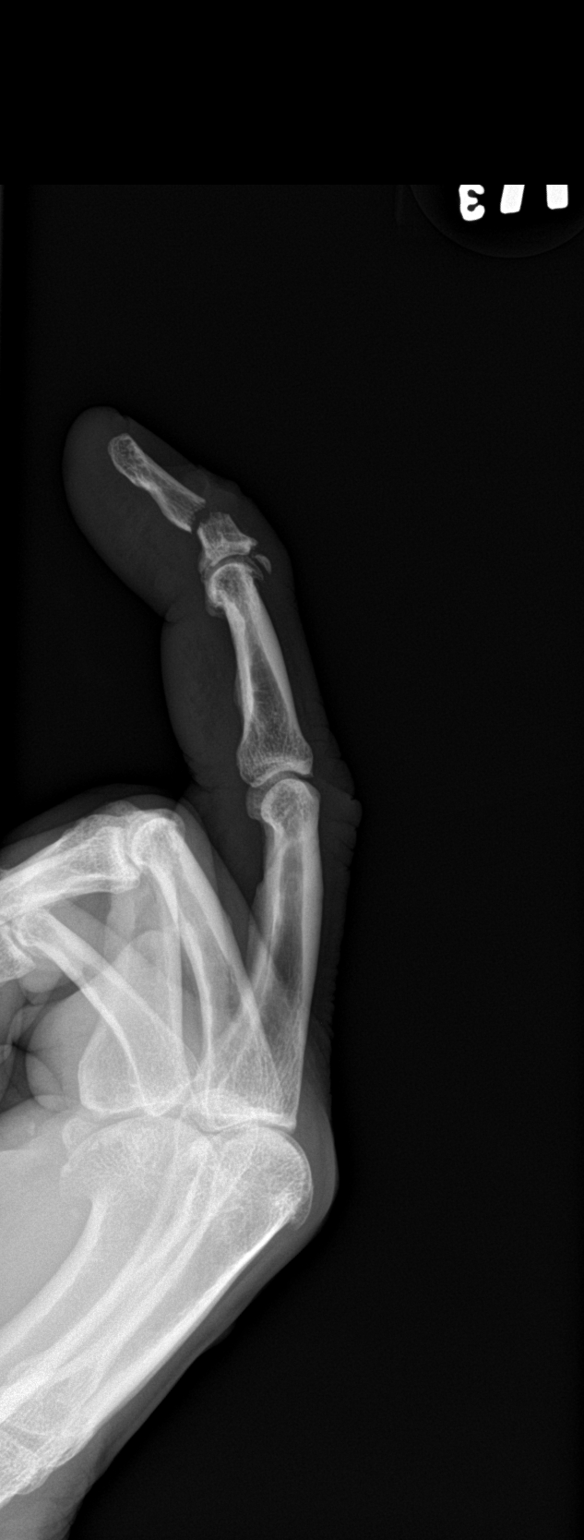

[3 of 3 positions shown; findings below may reference images not displayed]

FINDINGS: Three views of the right 3rd finger demonstrate a mildly comminuted
transverse fracture through the proximal 3rd of the right 3rd distal
phalanx (lateral view). The distal fragment is mildly distracted,
with mild volar displacement and angulation. Overlying soft tissue
injury. No radiopaque foreign body identified. Superimposed chronic
osteoarthritis at the 3rd DIP. Chronic appearing dorsal
crescent-shaped bone fragment adjacent to osteophytes at the head of
the 3rd middle phalanx. Partially visible degenerative changes with
joint space loss and osteophytosis at the 2nd and 3rd MCP joints.
IMPRESSION: 1. Transverse mildly comminuted and distracted fracture through the
proximal shaft of the right 3rd distal phalanx. Associated soft
tissue injury.
2. Superimposed chronic osteoarthritis of the right 3rd DIP.

## 2018-05-13 DIAGNOSIS — H4720 Unspecified optic atrophy: Secondary | ICD-10-CM | POA: Diagnosis not present

## 2018-05-13 DIAGNOSIS — H3562 Retinal hemorrhage, left eye: Secondary | ICD-10-CM | POA: Diagnosis not present

## 2018-05-13 DIAGNOSIS — H34832 Tributary (branch) retinal vein occlusion, left eye, with macular edema: Secondary | ICD-10-CM | POA: Diagnosis not present

## 2018-05-13 DIAGNOSIS — H43812 Vitreous degeneration, left eye: Secondary | ICD-10-CM | POA: Diagnosis not present

## 2018-05-13 DIAGNOSIS — H35352 Cystoid macular degeneration, left eye: Secondary | ICD-10-CM | POA: Diagnosis not present

## 2018-06-07 ENCOUNTER — Other Ambulatory Visit: Payer: Self-pay | Admitting: Family Medicine

## 2018-06-07 MED ORDER — CLONIDINE HCL 0.1 MG PO TABS: 0.1000 mg | ORAL_TABLET | Freq: Two times a day (BID) | ORAL | 0 refills | Status: DC

## 2018-06-24 DIAGNOSIS — H401193 Primary open-angle glaucoma, unspecified eye, severe stage: Secondary | ICD-10-CM | POA: Diagnosis not present

## 2018-06-24 DIAGNOSIS — H35072 Retinal telangiectasis, left eye: Secondary | ICD-10-CM | POA: Diagnosis not present

## 2018-06-24 DIAGNOSIS — H34832 Tributary (branch) retinal vein occlusion, left eye, with macular edema: Secondary | ICD-10-CM | POA: Diagnosis not present

## 2018-06-24 DIAGNOSIS — H35352 Cystoid macular degeneration, left eye: Secondary | ICD-10-CM | POA: Diagnosis not present

## 2018-06-27 ENCOUNTER — Other Ambulatory Visit: Payer: Self-pay | Admitting: Family Medicine

## 2018-08-06 DIAGNOSIS — H401193 Primary open-angle glaucoma, unspecified eye, severe stage: Secondary | ICD-10-CM | POA: Diagnosis not present

## 2018-08-06 DIAGNOSIS — H4720 Unspecified optic atrophy: Secondary | ICD-10-CM | POA: Diagnosis not present

## 2018-08-06 DIAGNOSIS — H35352 Cystoid macular degeneration, left eye: Secondary | ICD-10-CM | POA: Diagnosis not present

## 2018-08-06 DIAGNOSIS — H34832 Tributary (branch) retinal vein occlusion, left eye, with macular edema: Secondary | ICD-10-CM | POA: Diagnosis not present

## 2018-08-06 DIAGNOSIS — H43812 Vitreous degeneration, left eye: Secondary | ICD-10-CM | POA: Diagnosis not present

## 2018-09-02 ENCOUNTER — Other Ambulatory Visit: Payer: Self-pay | Admitting: Family Medicine

## 2018-09-05 ENCOUNTER — Other Ambulatory Visit: Payer: Self-pay

## 2018-09-05 ENCOUNTER — Encounter: Payer: Self-pay | Admitting: Family Medicine

## 2018-09-05 ENCOUNTER — Ambulatory Visit (INDEPENDENT_AMBULATORY_CARE_PROVIDER_SITE_OTHER): Payer: Medicare Other | Admitting: Family Medicine

## 2018-09-05 VITALS — BP 122/78 | HR 60 | Temp 98.2°F | Resp 16 | Ht 69.29 in | Wt 152.0 lb

## 2018-09-05 DIAGNOSIS — Z8673 Personal history of transient ischemic attack (TIA), and cerebral infarction without residual deficits: Secondary | ICD-10-CM | POA: Diagnosis not present

## 2018-09-05 DIAGNOSIS — C61 Malignant neoplasm of prostate: Secondary | ICD-10-CM | POA: Diagnosis not present

## 2018-09-05 DIAGNOSIS — I10 Essential (primary) hypertension: Secondary | ICD-10-CM

## 2018-09-05 DIAGNOSIS — Z720 Tobacco use: Secondary | ICD-10-CM
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
## 2018-09-06 LAB — CBC WITH DIFFERENTIAL/PLATELET
Absolute Monocytes: 403 cells/uL (ref 200–950)
Basophils Absolute: 82 cells/uL (ref 0–200)
Basophils Relative: 1.7 %
Eosinophils Absolute: 432 cells/uL (ref 15–500)
Eosinophils Relative: 9 %
HCT: 41.5 % (ref 38.5–50.0)
Hemoglobin: 13.2 g/dL (ref 13.2–17.1)
Lymphs Abs: 1258 cells/uL (ref 850–3900)
MCH: 31.7 pg (ref 27.0–33.0)
MCHC: 31.8 g/dL — ABNORMAL LOW (ref 32.0–36.0)
MCV: 99.5 fL (ref 80.0–100.0)
MPV: 12.4 fL (ref 7.5–12.5)
Monocytes Relative: 8.4 %
Neutro Abs: 2626 cells/uL (ref 1500–7800)
Neutrophils Relative %: 54.7 %
Platelets: 159 10*3/uL (ref 140–400)
RBC: 4.17 10*6/uL — ABNORMAL LOW (ref 4.20–5.80)
RDW: 12.1 % (ref 11.0–15.0)
Total Lymphocyte: 26.2 %
WBC: 4.8 10*3/uL (ref 3.8–10.8)

## 2018-09-06 LAB — COMPLETE METABOLIC PANEL WITH GFR
AG Ratio: 1.8 (calc) (ref 1.0–2.5)
ALT: 10 U/L (ref 9–46)
AST: 16 U/L (ref 10–35)
Albumin: 4.2 g/dL (ref 3.6–5.1)
Alkaline phosphatase (APISO): 49 U/L (ref 35–144)
BUN/Creatinine Ratio: 25 (calc) — ABNORMAL HIGH (ref 6–22)
BUN: 29 mg/dL — ABNORMAL HIGH (ref 7–25)
CO2: 29 mmol/L (ref 20–32)
Calcium: 9.2 mg/dL (ref 8.6–10.3)
Chloride: 110 mmol/L (ref 98–110)
Creat: 1.14 mg/dL (ref 0.70–1.25)
GFR, Est African American: 76 mL/min/{1.73_m2} (ref >=60)
GFR, Est Non African American: 65 mL/min/{1.73_m2} (ref >=60)
Globulin: 2.3 g/dL (calc) (ref 1.9–3.7)
Glucose, Bld: 70 mg/dL (ref 65–99)
Potassium: 4.6 mmol/L (ref 3.5–5.3)
Sodium: 143 mmol/L (ref 135–146)
Total Bilirubin: 0.7 mg/dL (ref 0.2–1.2)
Total Protein: 6.5 g/dL (ref 6.1–8.1)

## 2018-09-06 LAB — LIPID PANEL
Cholesterol: 118 mg/dL (ref <200)
HDL: 40 mg/dL (ref >=40)
LDL Cholesterol (Calc): 60 mg/dL (calc)
Non-HDL Cholesterol (Calc): 78 mg/dL (calc) (ref <130)
Total CHOL/HDL Ratio: 3 (calc) (ref <5.0)
Triglycerides: 97 mg/dL (ref <150)

## 2018-09-06 LAB — PSA: PSA: 7 ng/mL — ABNORMAL HIGH (ref <=4.0)

## 2018-09-17 DIAGNOSIS — H4720 Unspecified optic atrophy: Secondary | ICD-10-CM | POA: Diagnosis not present

## 2018-09-17 DIAGNOSIS — H34832 Tributary (branch) retinal vein occlusion, left eye, with macular edema: Secondary | ICD-10-CM | POA: Diagnosis not present

## 2018-09-17 DIAGNOSIS — H35352 Cystoid macular degeneration, left eye: Secondary | ICD-10-CM | POA: Diagnosis not present

## 2018-09-17 DIAGNOSIS — H401193 Primary open-angle glaucoma, unspecified eye, severe stage: Secondary | ICD-10-CM | POA: Diagnosis not present

## 2018-10-29 ENCOUNTER — Other Ambulatory Visit: Payer: Self-pay | Admitting: Family Medicine

## 2018-10-29 DIAGNOSIS — H35352 Cystoid macular degeneration, left eye: Secondary | ICD-10-CM | POA: Diagnosis not present

## 2018-10-29 DIAGNOSIS — H4720 Unspecified optic atrophy: Secondary | ICD-10-CM | POA: Diagnosis not present

## 2018-10-29 DIAGNOSIS — H35072 Retinal telangiectasis, left eye: Secondary | ICD-10-CM | POA: Diagnosis not present

## 2018-10-29 DIAGNOSIS — H34832 Tributary (branch) retinal vein occlusion, left eye, with macular edema: Secondary | ICD-10-CM | POA: Diagnosis not present

## 2018-12-03 DIAGNOSIS — H34832 Tributary (branch) retinal vein occlusion, left eye, with macular edema: Secondary | ICD-10-CM | POA: Diagnosis not present

## 2018-12-03 DIAGNOSIS — H35352 Cystoid macular degeneration, left eye: Secondary | ICD-10-CM | POA: Diagnosis not present

## 2018-12-03 DIAGNOSIS — H4720 Unspecified optic atrophy: Secondary | ICD-10-CM | POA: Diagnosis not present

## 2018-12-03 DIAGNOSIS — H3562 Retinal hemorrhage, left eye: Secondary | ICD-10-CM | POA: Diagnosis not present

## 2019-01-07 ENCOUNTER — Ambulatory Visit (INDEPENDENT_AMBULATORY_CARE_PROVIDER_SITE_OTHER): Payer: Medicare Other

## 2019-01-07 DIAGNOSIS — Z23 Encounter for immunization: Secondary | ICD-10-CM | POA: Diagnosis not present

## 2019-01-07 DIAGNOSIS — H35352 Cystoid macular degeneration, left eye: Secondary | ICD-10-CM | POA: Diagnosis not present

## 2019-01-07 DIAGNOSIS — H4720 Unspecified optic atrophy: Secondary | ICD-10-CM | POA: Diagnosis not present

## 2019-01-07 DIAGNOSIS — H34832 Tributary (branch) retinal vein occlusion, left eye, with macular edema: Secondary | ICD-10-CM | POA: Diagnosis not present

## 2019-02-18 DIAGNOSIS — H401193 Primary open-angle glaucoma, unspecified eye, severe stage: Secondary | ICD-10-CM | POA: Diagnosis not present

## 2019-02-18 DIAGNOSIS — H34832 Tributary (branch) retinal vein occlusion, left eye, with macular edema: Secondary | ICD-10-CM | POA: Diagnosis not present

## 2019-02-18 DIAGNOSIS — H35352 Cystoid macular degeneration, left eye: Secondary | ICD-10-CM | POA: Diagnosis not present

## 2019-02-18 DIAGNOSIS — H4720 Unspecified optic atrophy: Secondary | ICD-10-CM | POA: Diagnosis not present

## 2019-03-19 DIAGNOSIS — H348322 Tributary (branch) retinal vein occlusion, left eye, stable: Secondary | ICD-10-CM | POA: Diagnosis not present

## 2019-03-19 DIAGNOSIS — Z961 Presence of intraocular lens: Secondary | ICD-10-CM | POA: Diagnosis not present

## 2019-03-19 DIAGNOSIS — H401131 Primary open-angle glaucoma, bilateral, mild stage: Secondary | ICD-10-CM | POA: Diagnosis not present

## 2019-04-01 DIAGNOSIS — H34832 Tributary (branch) retinal vein occlusion, left eye, with macular edema: Secondary | ICD-10-CM | POA: Diagnosis not present

## 2019-04-01 DIAGNOSIS — H43812 Vitreous degeneration, left eye: Secondary | ICD-10-CM | POA: Diagnosis not present

## 2019-04-01 DIAGNOSIS — H35352 Cystoid macular degeneration, left eye: Secondary | ICD-10-CM | POA: Diagnosis not present

## 2019-05-13 DIAGNOSIS — H35352 Cystoid macular degeneration, left eye: Secondary | ICD-10-CM | POA: Diagnosis not present

## 2019-05-13 DIAGNOSIS — H4720 Unspecified optic atrophy: Secondary | ICD-10-CM | POA: Diagnosis not present

## 2019-05-13 DIAGNOSIS — H401193 Primary open-angle glaucoma, unspecified eye, severe stage: Secondary | ICD-10-CM | POA: Diagnosis not present

## 2019-05-13 DIAGNOSIS — H34832 Tributary (branch) retinal vein occlusion, left eye, with macular edema: Secondary | ICD-10-CM | POA: Diagnosis not present

## 2019-06-24 ENCOUNTER — Ambulatory Visit (INDEPENDENT_AMBULATORY_CARE_PROVIDER_SITE_OTHER): Payer: Medicare Other | Admitting: Ophthalmology

## 2019-06-24 ENCOUNTER — Encounter (INDEPENDENT_AMBULATORY_CARE_PROVIDER_SITE_OTHER): Payer: Self-pay | Admitting: Ophthalmology

## 2019-06-24 ENCOUNTER — Other Ambulatory Visit: Payer: Self-pay

## 2019-06-24 DIAGNOSIS — Z961 Presence of intraocular lens: Secondary | ICD-10-CM
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
## 2019-06-26 ENCOUNTER — Encounter (INDEPENDENT_AMBULATORY_CARE_PROVIDER_SITE_OTHER): Payer: Self-pay | Admitting: Ophthalmology

## 2019-06-26 ENCOUNTER — Other Ambulatory Visit: Payer: Self-pay

## 2019-06-26 ENCOUNTER — Ambulatory Visit (INDEPENDENT_AMBULATORY_CARE_PROVIDER_SITE_OTHER): Payer: Medicare Other | Admitting: Ophthalmology

## 2019-06-26 DIAGNOSIS — H401193 Primary open-angle glaucoma, unspecified eye, severe stage: Secondary | ICD-10-CM

## 2019-06-26 DIAGNOSIS — H35352 Cystoid macular degeneration, left eye: Secondary | ICD-10-CM | POA: Diagnosis not present

## 2019-06-26 DIAGNOSIS — H4720 Unspecified optic atrophy: Secondary | ICD-10-CM | POA: Insufficient documentation

## 2019-06-26 DIAGNOSIS — H34832 Tributary (branch) retinal vein occlusion, left eye, with macular edema: Secondary | ICD-10-CM | POA: Diagnosis not present

## 2019-06-26 MED ORDER — BEVACIZUMAB CHEMO INJECTION 1.25MG/0.05ML SYRINGE FOR KALEIDOSCOPE
1.2500 mg | INTRAVITREAL | Status: AC | PRN
Start: 2019-06-26 — End: 2019-06-26
  Administered 2019-06-26: 1.25 mg via INTRAVITREAL
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
## 2019-06-28 ENCOUNTER — Other Ambulatory Visit: Payer: Self-pay | Admitting: Family Medicine

## 2019-08-07 ENCOUNTER — Other Ambulatory Visit: Payer: Self-pay

## 2019-08-07 ENCOUNTER — Encounter (INDEPENDENT_AMBULATORY_CARE_PROVIDER_SITE_OTHER): Payer: Self-pay | Admitting: Ophthalmology

## 2019-08-07 ENCOUNTER — Ambulatory Visit (INDEPENDENT_AMBULATORY_CARE_PROVIDER_SITE_OTHER): Payer: Medicare Other | Admitting: Ophthalmology

## 2019-08-07 DIAGNOSIS — H401112 Primary open-angle glaucoma, right eye, moderate stage: Secondary | ICD-10-CM

## 2019-08-07 DIAGNOSIS — H4720 Unspecified optic atrophy: Secondary | ICD-10-CM

## 2019-08-07 DIAGNOSIS — H34832 Tributary (branch) retinal vein occlusion, left eye, with macular edema: Secondary | ICD-10-CM | POA: Diagnosis not present

## 2019-08-07 DIAGNOSIS — H401123 Primary open-angle glaucoma, left eye, severe stage: Secondary | ICD-10-CM

## 2019-08-07 DIAGNOSIS — H35352 Cystoid macular degeneration, left eye: Secondary | ICD-10-CM | POA: Diagnosis not present

## 2019-08-07 MED ORDER — BEVACIZUMAB CHEMO INJECTION 1.25MG/0.05ML SYRINGE FOR KALEIDOSCOPE
1.2500 mg | INTRAVITREAL | Status: AC | PRN
  Administered 2019-08-07: 1.25 mg via INTRAVITREAL

## 2019-08-07 NOTE — Assessment & Plan Note (Addendum)
The nature of branch retinal vein occlusion with macular edema was discussed.  The patient was given access to printed information.  The treatment options including continued observation looking for spontaneous resolution versus grid laser versus intravitreal Kenalog injection were discussed.  PRIMARY THERAPY CONSISTS of Anti-VEGF Therapies, AVASTIN, LUCENTIS AND EYLEA.  Their usage was discussed to assist in halting the progression of Macular Edema, in order to preserve, protect or improve acuity.  Additionally, at times, limited focal laser therapy is used in the management.  The risks and benefits of all these options were discussed with the patient.  The patient's questions were answered.OS condition is stable and less CME at 6-week interval after repeat Avastin OS.  No neovascular complications noted.  Repeat intravitreal Avastin OS today and examination and repeated in 6 weeks

## 2019-08-07 NOTE — Progress Notes (Signed)
08/07/2019     CHIEF COMPLAINT Patient presents for Retina Follow Up   HISTORY OF PRESENT ILLNESS: Oscar Greene is a 69 y.o. male who presents to the clinic today for:   HPI    Retina Follow Up    Patient presents with  CRVO/BRVO.  In left eye.  Severity is moderate.  Duration of 6 weeks.  Since onset it is stable.  I, the attending physician,  performed the HPI with the patient and updated documentation appropriately.          Comments    6 Week BRVO f\u OS. Possible Avastin OS. OCT  Pt states vision is stable but he can tell it is time for OS inj. Using gtts as directed.        Last edited by Tilda Franco on 08/07/2019  9:26 AM. (History)      Referring physician: [name], MD [address] [place],  [place] [address]  HISTORICAL INFORMATION:   Selected notes from the MEDICAL RECORD NUMBER    Lab Results  Component Value Date   HGBA1C 5.0 11/18/2013     CURRENT MEDICATIONS: Current Outpatient Medications (Ophthalmic Drugs)  Medication Sig  . brimonidine (ALPHAGAN) 0.2 % ophthalmic solution 1 drop 2 (two) times daily.  . dorzolamide-timolol (COSOPT) 22.3-6.8 MG/ML ophthalmic solution 1 DROP IN BOTH EYES TWICE A DAY  . latanoprost (XALATAN) 0.005 % ophthalmic solution Place 1 drop into both eyes daily.   No current facility-administered medications for this visit. (Ophthalmic Drugs)   Current Outpatient Medications (Other)  Medication Sig  . cloNIDine (CATAPRES) 0.1 MG tablet TAKE 1 TABLET BY MOUTH TWICE A DAY  . clopidogrel (PLAVIX) 75 MG tablet TAKE 1 TABLET BY MOUTH EVERY DAY   No current facility-administered medications for this visit. (Other)      REVIEW OF SYSTEMS:    ALLERGIES No Known Allergies  PAST MEDICAL HISTORY Past Medical History:  Diagnosis Date  . AKI (acute kidney injury) ([place]) 06/2015  . Cataract   . CVA (cerebral vascular accident) ([place])   . Glaucoma   . Headache(784.0)   . History of traumatic head  injury 03/14/1983   A pitching wedge hit him in left frontal area  . Hypertension   . Osteoporosis   . 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 Thrombocytopenia ([place])    Past Surgical History:  Procedure Laterality Date  . CATARACT EXTRACTION W/PHACO Bilateral 2013   Dr. Katy Fitch  . L frontal craniotomy to remove blood clot      FAMILY HISTORY Family History  Problem Relation Age of Onset  . Cancer Mother   . Hypertension Father     SOCIAL HISTORY Social History   Tobacco Use  . Smoking status: Current Every Day Smoker    Packs/day: 1.00    Years: 45.00    Pack years: 45.00    Types: Cigarettes  . Smokeless tobacco: Never Used  Substance Use Topics  . Alcohol use: Yes    Comment: occasional  . Drug use: No         OPHTHALMIC EXAM: Base Eye Exam    Visual Acuity (ETDRS)      Right Left   Dist [place] 20/60 20/40   Dist ph [place] 20/40 20/30 -1       Tonometry (Tonopen, 9:32 AM)      Right Left   Pressure 13 15       Pupils  Pupils Dark Light Shape React APD   Right PERRL 3 3 Round Minimal None   Left PERRL 3 3 Round Minimal None       Visual Fields      Left Right   Restrictions Partial outer superior nasal deficiency Partial outer superior nasal, inferior nasal deficiencies       Neuro/Psych    Oriented x3: Yes   Mood/Affect: Normal       Dilation    Left eye: 1.0% Mydriacyl, 2.5% Phenylephrine @ 9:32 AM        Slit Lamp and Fundus Exam    External Exam      Right Left   External Normal Normal       Slit Lamp Exam      Right Left   Lids/Lashes Normal Normal   Conjunctiva/Sclera White and quiet White and quiet   Cornea Clear Clear   Anterior Chamber Deep and quiet Deep and quiet   Iris Round and reactive Round and reactive   Lens Posterior chamber intraocular lens Posterior chamber intraocular lens   Anterior Vitreous Normal Normal       Fundus Exam      Right Left   Posterior Vitreous  Posterior vitreous detachment   Disc   Collaterals on the nerve, no nvd   C/D Ratio  0.8   Macula  Microaneurysms, Hemorrhage   Vessels  Old central retinal vein occlusion   Periphery  Normal, no neovascularization          IMAGING AND PROCEDURES  Imaging and Procedures for 08/07/19  OCT, Retina - OU - Both Eyes       Right Eye Quality was good. Scan locations included subfoveal. Central Foveal Thickness: 244. Findings include normal observations.   Left Eye Quality was good. Scan locations included subfoveal. Central Foveal Thickness: 250. Progression has improved. Findings include cystoid macular edema.   Notes CME OS from macular branch retinal vein occlusion much improved today at 6-week interval after Avastin.  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  No neovascular complications noted.  Repeat intravitreal Avastin OS today and examination and repeated in 6 weeks  Glaucoma Continue on topical therapy as prescribed      ICD-10-CM   1. 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 Primary open angle glaucoma (POAG) of left eye, severe stage  H40.1123   3. Primary open angle glaucoma of right eye, moderate stage  H40.1112     1.  OS, improved on intravitreal Avastin at 6-week interval.  We will repeat intravitreal Avastin OS today and examination in 6 weeks  2.  3.  Ophthalmic Meds Ordered this visit:  No orders of the defined types were placed in this encounter.      No follow-ups on file.  There are no Patient Instructions on file for this visit.   Explained the diagnoses, plan, and follow up with the patient and they expressed understanding.  Patient expressed understanding of the importance of proper follow up care.   Clent Demark Perpetua Elling M.D. Diseases &  Surgery of the Retina and Vitreous Retina & Diabetic [place] 08/07/19     Abbreviations: M myopia (nearsighted); A astigmatism;  H hyperopia (farsighted); P presbyopia; Mrx spectacle prescription;  CTL contact lenses; OD right eye; OS left eye; OU both eyes  XT exotropia; ET esotropia; PEK punctate epithelial keratitis; PEE punctate epithelial erosions; DES dry eye syndrome; MGD meibomian gland dysfunction; ATs artificial tears; PFAT's preservative free artificial tears; [place] nuclear sclerotic cataract; PSC posterior subcapsular cataract; ERM epi-retinal membrane; PVD posterior vitreous detachment; RD retinal detachment; DM diabetes mellitus; DR diabetic retinopathy; NPDR non-proliferative diabetic retinopathy; PDR proliferative diabetic retinopathy; CSME clinically significant macular edema; DME diabetic macular edema; dbh dot blot hemorrhages; CWS cotton wool spot; POAG primary open angle glaucoma; C/D cup-to-disc ratio; HVF humphrey visual field; GVF goldmann visual field; OCT optical coherence tomography; IOP intraocular pressure; BRVO Branch retinal vein occlusion; CRVO central retinal vein occlusion; CRAO central retinal artery occlusion; BRAO branch retinal artery occlusion; RT retinal tear; SB scleral buckle; PPV pars plana vitrectomy; VH Vitreous hemorrhage; PRP panretinal laser photocoagulation; IVK intravitreal kenalog; VMT vitreomacular traction; MH Macular hole;  NVD neovascularization of the disc; NVE neovascularization elsewhere; AREDS age related eye disease study; ARMD age related macular degeneration; POAG primary open angle glaucoma; EBMD epithelial/anterior basement membrane dystrophy; ACIOL anterior chamber intraocular lens; IOL intraocular lens; PCIOL posterior chamber intraocular lens; Phaco/IOL phacoemulsification with intraocular lens placement; [place] photorefractive keratectomy; LASIK laser assisted in situ keratomileusis; HTN hypertension; DM diabetes mellitus; COPD chronic  obstructive pulmonary disease

## 2019-08-07 NOTE — Assessment & Plan Note (Signed)
Continue on topical therapy as prescribed

## 2019-09-16 DIAGNOSIS — H401131 Primary open-angle glaucoma, bilateral, mild stage: Secondary | ICD-10-CM | POA: Diagnosis not present

## 2019-09-18 ENCOUNTER — Other Ambulatory Visit: Payer: Self-pay

## 2019-09-18 ENCOUNTER — Encounter (INDEPENDENT_AMBULATORY_CARE_PROVIDER_SITE_OTHER): Payer: Self-pay | Admitting: Ophthalmology

## 2019-09-18 ENCOUNTER — Ambulatory Visit (INDEPENDENT_AMBULATORY_CARE_PROVIDER_SITE_OTHER): Payer: Medicare Other | Admitting: Ophthalmology

## 2019-09-18 DIAGNOSIS — H401133 Primary open-angle glaucoma, bilateral, severe stage: Secondary | ICD-10-CM

## 2019-09-18 DIAGNOSIS — H34832 Tributary (branch) retinal vein occlusion, left eye, with macular edema: Secondary | ICD-10-CM

## 2019-09-18 MED ORDER — BEVACIZUMAB CHEMO INJECTION 1.25MG/0.05ML SYRINGE FOR KALEIDOSCOPE
1.2500 mg | INTRAVITREAL | Status: AC | PRN
Start: 2019-09-18 — End: 2019-09-18
  Administered 2019-09-18: 1.25 mg via INTRAVITREAL

## 2019-09-18 NOTE — Assessment & Plan Note (Signed)
OS, stable on 6-week follow-up examination today's post intravitreal Avastin will repeat today

## 2019-09-18 NOTE — Progress Notes (Signed)
09/18/2019     CHIEF COMPLAINT Patient presents for Retina Follow Up   HISTORY OF PRESENT ILLNESS: Oscar Greene is a 69 y.o. male who presents to the clinic today for:   HPI    Retina Follow Up    Patient presents with  CRVO/BRVO.  In left eye.  Severity is moderate.  Duration of 6 weeks.  Since onset it is stable.  I, the attending physician,  performed the HPI with the patient and updated documentation appropriately.          Comments    6 Week BRVO f\u OS. Possible Avastin OS. OCT  Pt states no changes in vision. Pt saw Dr. Katy Fitch on Tuesday. Using gtts as directed.       Last edited by Tilda Franco on 09/18/2019 10:23 AM. (History)      Referring physician: [name], MD [address] [place],  [place] [address]  HISTORICAL INFORMATION:   Selected notes from the MEDICAL RECORD NUMBER    Lab Results  Component Value Date   HGBA1C 5.0 11/18/2013     CURRENT MEDICATIONS: Current Outpatient Medications (Ophthalmic Drugs)  Medication Sig  . brimonidine (ALPHAGAN) 0.2 % ophthalmic solution 1 drop 2 (two) times daily.  . dorzolamide-timolol (COSOPT) 22.3-6.8 MG/ML ophthalmic solution 1 DROP IN BOTH EYES TWICE A DAY  . latanoprost (XALATAN) 0.005 % ophthalmic solution Place 1 drop into both eyes daily.   No current facility-administered medications for this visit. (Ophthalmic Drugs)   Current Outpatient Medications (Other)  Medication Sig  . cloNIDine (CATAPRES) 0.1 MG tablet TAKE 1 TABLET BY MOUTH TWICE A DAY  . clopidogrel (PLAVIX) 75 MG tablet TAKE 1 TABLET BY MOUTH EVERY DAY   No current facility-administered medications for this visit. (Other)      REVIEW OF SYSTEMS:    ALLERGIES No Known Allergies  PAST MEDICAL HISTORY Past Medical History:  Diagnosis Date  . AKI (acute kidney injury) ([place]) 06/2015  . Cataract   . CVA (cerebral vascular accident) ([place])   . Glaucoma   . Headache(784.0)   . History of traumatic head injury  03/14/1983   A pitching wedge hit him in left frontal area  . Hypertension   . Osteoporosis   . 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 Thrombocytopenia ([place])    Past Surgical History:  Procedure Laterality Date  . CATARACT EXTRACTION W/PHACO Bilateral 2013   Dr. Katy Fitch  . L frontal craniotomy to remove blood clot      FAMILY HISTORY Family History  Problem Relation Age of Onset  . Cancer Mother   . Hypertension Father     SOCIAL HISTORY Social History   Tobacco Use  . Smoking status: Current Every Day Smoker    Packs/day: 1.00    Years: 45.00    Pack years: 45.00    Types: Cigarettes  . Smokeless tobacco: Never Used  Substance Use Topics  . Alcohol use: Yes    Comment: occasional  . Drug use: No         OPHTHALMIC EXAM:  Base Eye Exam    Visual Acuity (Snellen - Linear)      Right Left   Dist [place] 20/50 -1 20/60 +   Dist ph [place] 20/30 -1 20/40 +       Tonometry (Tonopen, 10:28 AM)      Right Left   Pressure 11 14  Pupils      Pupils Dark Light Shape React APD   Right PERRL 3 3 Round Minimal None   Left PERRL 4 3.5 Round Sluggish None       Visual Fields (Counting fingers)      Left Right   Restrictions Partial outer superior nasal deficiency Partial outer superior nasal, inferior nasal deficiencies       Neuro/Psych    Oriented x3: Yes   Mood/Affect: Normal       Dilation    Left eye: 1.0% Mydriacyl, 2.5% Phenylephrine @ 10:28 AM        Slit Lamp and Fundus Exam    External Exam      Right Left   External Normal Normal       Slit Lamp Exam      Right Left   Lids/Lashes Normal Normal   Conjunctiva/Sclera White and quiet White and quiet   Cornea Clear Clear   Anterior Chamber Deep and quiet Deep and quiet   Iris Round and reactive Round and reactive   Lens Posterior chamber intraocular lens Posterior chamber intraocular lens   Anterior Vitreous Normal Normal       Fundus Exam      Right Left   Posterior Vitreous   Posterior vitreous detachment   Disc  Collaterals on the nerve, no nvd   C/D Ratio  0.8   Macula  Microaneurysms, Hemorrhage   Vessels  Old central retinal vein occlusion   Periphery  Normal, no neovascularization          IMAGING AND PROCEDURES  Imaging and Procedures for 09/18/19  OCT, Retina - OU - Both Eyes       Right Eye Quality was good. Scan locations included subfoveal. Central Foveal Thickness: 246. Findings include retinal drusen .   Left Eye Quality was good. Scan locations included subfoveal. Central Foveal Thickness: 247. Progression has been stable. Findings include cystoid macular edema, retinal drusen .   Notes Perifoveal CME OS from old central retinal vein occlusion, controlled today at 6-week interval post intravitreal Avastin will repeat today and examination in 6 weeks       Intravitreal Injection, Pharmacologic Agent - OS - Left Eye       Time Out 09/18/2019. 11:25 AM. Confirmed correct patient, procedure, site, and patient consented.   Anesthesia Topical anesthesia was used. Anesthetic medications included Akten 3.5%.   Procedure Preparation included 10% betadine to eyelids, Ofloxacin , 5% betadine to ocular surface. A 30 gauge needle was used.   Injection:  1.25 mg Bevacizumab (AVASTIN) SOLN   NDC: [address]-3, Lot: [id number]   Route: Intravitreal, Site: Left Eye, Waste: 0 mg  Post-op Post injection exam found visual acuity of at least counting fingers. The patient tolerated the procedure well. There were no complications. The patient received written and verbal post procedure care education. Post injection medications were not given.                 ASSESSMENT/PLAN:  Branch retinal vein occlusion with macular edema of left eye OS, stable on 6-week follow-up examination today's post intravitreal Avastin will repeat today      ICD-10-CM   1. Branch retinal vein occlusion with macular edema of left eye  H34.8320 OCT, Retina - OU -  Both Eyes    Intravitreal Injection, Pharmacologic Agent - OS - Left Eye    Bevacizumab (AVASTIN) SOLN 1.25 mg  2. Primary open angle glaucoma (POAG) of both eyes, severe stage  E83.1517     1.  2.  3.  Ophthalmic Meds Ordered this visit:  Meds ordered this encounter  Medications  . Bevacizumab (AVASTIN) SOLN 1.25 mg       Return in about 6 weeks (around 10/30/2019) for AVASTIN OCT, OS.  There are no Patient Instructions on file for this visit.   Explained the diagnoses, plan, and follow up with the patient and they expressed understanding.  Patient expressed understanding of the importance of proper follow up care.   Clent Demark Miraj Truss M.D. Diseases & Surgery of the Retina and Vitreous Retina & Diabetic [place] 09/18/19     Abbreviations: M myopia (nearsighted); A astigmatism; H hyperopia (farsighted); P presbyopia; Mrx spectacle prescription;  CTL contact lenses; OD right eye; OS left eye; OU both eyes  XT exotropia; ET esotropia; PEK punctate epithelial keratitis; PEE punctate epithelial erosions; DES dry eye syndrome; MGD meibomian gland dysfunction; ATs artificial tears; PFAT's preservative free artificial tears; [place] nuclear sclerotic cataract; PSC posterior subcapsular cataract; ERM epi-retinal membrane; PVD posterior vitreous detachment; RD retinal detachment; DM diabetes mellitus; DR diabetic retinopathy; NPDR non-proliferative diabetic retinopathy; PDR proliferative diabetic retinopathy; CSME clinically significant macular edema; DME diabetic macular edema; dbh dot blot hemorrhages; CWS cotton wool spot; POAG primary open angle glaucoma; C/D cup-to-disc ratio; HVF humphrey visual field; GVF goldmann visual field; OCT optical coherence tomography; IOP intraocular pressure; BRVO Branch retinal vein occlusion; CRVO central retinal vein occlusion; CRAO central retinal artery occlusion; BRAO branch retinal artery occlusion; RT retinal tear; SB scleral buckle; PPV pars plana  vitrectomy; VH Vitreous hemorrhage; PRP panretinal laser photocoagulation; IVK intravitreal kenalog; VMT vitreomacular traction; MH Macular hole;  NVD neovascularization of the disc; NVE neovascularization elsewhere; AREDS age related eye disease study; ARMD age related macular degeneration; POAG primary open angle glaucoma; EBMD epithelial/anterior basement membrane dystrophy; ACIOL anterior chamber intraocular lens; IOL intraocular lens; PCIOL posterior chamber intraocular lens; Phaco/IOL phacoemulsification with intraocular lens placement; [place] photorefractive keratectomy; LASIK laser assisted in situ keratomileusis; HTN hypertension; DM diabetes mellitus; COPD chronic obstructive pulmonary disease

## 2019-10-30 ENCOUNTER — Ambulatory Visit (INDEPENDENT_AMBULATORY_CARE_PROVIDER_SITE_OTHER): Payer: Medicare Other | Admitting: Ophthalmology

## 2019-10-30 ENCOUNTER — Encounter (INDEPENDENT_AMBULATORY_CARE_PROVIDER_SITE_OTHER): Payer: Self-pay | Admitting: Ophthalmology

## 2019-10-30 ENCOUNTER — Other Ambulatory Visit: Payer: Self-pay

## 2019-10-30 DIAGNOSIS — H34832 Tributary (branch) retinal vein occlusion, left eye, with macular edema: Secondary | ICD-10-CM

## 2019-10-30 MED ORDER — BEVACIZUMAB CHEMO INJECTION 1.25MG/0.05ML SYRINGE FOR KALEIDOSCOPE
1.2500 mg | INTRAVITREAL | Status: AC | PRN
  Administered 2019-10-30: 1.25 mg via INTRAVITREAL

## 2019-10-30 NOTE — Progress Notes (Signed)
10/30/2019     CHIEF COMPLAINT Patient presents for Retina Follow Up   HISTORY OF PRESENT ILLNESS: Oscar Greene is a 69 y.o. male who presents to the clinic today for:   HPI    Retina Follow Up    Patient presents with  CRVO/BRVO.  In left eye.  Severity is moderate.  Duration of 6 weeks.  Since onset it is stable.  I, the attending physician,  performed the HPI with the patient and updated documentation appropriately.          Comments    6 Week BRVO f\u OS. Possible Avastin OS. OCT  Pt states he can tell it is time for an inj. OS becomes blurry. Denies any other complaints. Using gtts as directed.       Last edited by Tilda Franco on 10/30/2019 10:38 AM. (History)      Referring physician: [name], MD [address] [place],  [place] [address]  HISTORICAL INFORMATION:   Selected notes from the MEDICAL RECORD NUMBER    Lab Results  Component Value Date   HGBA1C 5.0 11/18/2013     CURRENT MEDICATIONS: Current Outpatient Medications (Ophthalmic Drugs)  Medication Sig  . brimonidine (ALPHAGAN) 0.2 % ophthalmic solution 1 drop 2 (two) times daily.  . dorzolamide-timolol (COSOPT) 22.3-6.8 MG/ML ophthalmic solution 1 DROP IN BOTH EYES TWICE A DAY  . latanoprost (XALATAN) 0.005 % ophthalmic solution Place 1 drop into both eyes daily.   No current facility-administered medications for this visit. (Ophthalmic Drugs)   Current Outpatient Medications (Other)  Medication Sig  . cloNIDine (CATAPRES) 0.1 MG tablet TAKE 1 TABLET BY MOUTH TWICE A DAY  . clopidogrel (PLAVIX) 75 MG tablet TAKE 1 TABLET BY MOUTH EVERY DAY   No current facility-administered medications for this visit. (Other)      REVIEW OF SYSTEMS:    ALLERGIES No Known Allergies  PAST MEDICAL HISTORY Past Medical History:  Diagnosis Date  . AKI (acute kidney injury) ([place]) 06/2015  . Cataract   . CVA (cerebral vascular accident) ([place])   . Glaucoma   . Headache(784.0)   .  History of traumatic head injury 03/14/1983   A pitching wedge hit him in left frontal area  . Hypertension   . Osteoporosis   . 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 Thrombocytopenia ([place])    Past Surgical History:  Procedure Laterality Date  . CATARACT EXTRACTION W/PHACO Bilateral 2013   Dr. Katy Fitch  . L frontal craniotomy to remove blood clot      FAMILY HISTORY Family History  Problem Relation Age of Onset  . Cancer Mother   . Hypertension Father     SOCIAL HISTORY Social History   Tobacco Use  . Smoking status: Current Every Day Smoker    Packs/day: 1.00    Years: 45.00    Pack years: 45.00    Types: Cigarettes  . Smokeless tobacco: Never Used  Substance Use Topics  . Alcohol use: Yes    Comment: occasional  . Drug use: No         OPHTHALMIC EXAM:  Base Eye Exam    Visual Acuity (Snellen - Linear)      Right Left   Dist [place] 20/40 20/60 -1   Dist ph [place] 20/30 -1 20/40 -2       Tonometry (Tonopen, 10:44 AM)      Right Left   Pressure 8 13  Pupils      Dark Light Shape React APD   Right 3 3 Round Minimal None   Left 4 3 Round Sluggish None       Visual Fields (Counting fingers)      Left Right   Restrictions Partial outer inferior nasal deficiency Partial outer inferior nasal deficiency       Neuro/Psych    Oriented x3: Yes   Mood/Affect: Normal       Dilation    Left eye: 1.0% Mydriacyl, 2.5% Phenylephrine @ 10:44 AM        Slit Lamp and Fundus Exam    External Exam      Right Left   External Normal Normal       Slit Lamp Exam      Right Left   Lids/Lashes Normal Normal   Conjunctiva/Sclera White and quiet White and quiet   Cornea Clear Clear   Anterior Chamber Deep and quiet Deep and quiet   Iris Round and reactive Round and reactive   Lens Posterior chamber intraocular lens Posterior chamber intraocular lens   Anterior Vitreous Normal Normal       Fundus Exam      Right Left   Posterior Vitreous  Posterior  vitreous detachment   Disc  Collaterals on the nerve, no nvd   C/D Ratio  0.8   Macula  Microaneurysms, Hemorrhage   Vessels  Old central retinal vein occlusion   Periphery  Normal, no neovascularization          IMAGING AND PROCEDURES  Imaging and Procedures for 10/30/19  OCT, Retina - OU - Both Eyes       Right Eye Quality was good. Scan locations included subfoveal. Central Foveal Thickness: 236. Progression has been stable. Findings include normal foveal contour.   Left Eye Quality was good. Scan locations included subfoveal. Central Foveal Thickness: 259. Progression has been stable. Findings include cystoid macular edema.   Notes OS, controlled from CRV O on intravitreal Avastin at 6-week interval.  Recurrences at longer interval of therapy or exam       Intravitreal Injection, Pharmacologic Agent - OS - Left Eye       Time Out 10/30/2019. 11:35 AM. Confirmed correct patient, procedure, site, and patient consented.   Anesthesia Topical anesthesia was used. Anesthetic medications included Akten 3.5%.   Procedure Preparation included 5% betadine to ocular surface, Tobramycin 0.3%, 10% betadine to eyelids. A supplied needle was used.   Injection:  1.25 mg Bevacizumab (AVASTIN) SOLN   NDC: [address]-7   Route: Intravitreal, Site: Left Eye, Waste: 0 mg  Post-op Post injection exam found visual acuity of at least counting fingers. The patient tolerated the procedure well. There were no complications. The patient received written and verbal post procedure care education. Post injection medications were not given.                 ASSESSMENT/PLAN:  Branch retinal vein occlusion with macular edema of left eye Controlled on intravitreal Avastin, will repeat injection today and examination in 6 to 8 weeks      ICD-10-CM   1. Branch retinal vein occlusion with macular edema of left eye  H34.8320 OCT, Retina - OU - Both Eyes    Intravitreal Injection,  Pharmacologic Agent - OS - Left Eye    Bevacizumab (AVASTIN) SOLN 1.25 mg    1.  2.  3.  Ophthalmic Meds Ordered this visit:  Meds ordered this encounter  Medications  .  Bevacizumab (AVASTIN) SOLN 1.25 mg       Return in about 6 weeks (around 12/11/2019), or Follow-up 6 -7 weeks okay, for dilate, OS, AVASTIN OCT.  There are no Patient Instructions on file for this visit.   Explained the diagnoses, plan, and follow up with the patient and they expressed understanding.  Patient expressed understanding of the importance of proper follow up care.   Clent Demark Jaymir Struble M.D. Diseases & Surgery of the Retina and Vitreous Retina & Diabetic [place] 10/30/19     Abbreviations: M myopia (nearsighted); A astigmatism; H hyperopia (farsighted); P presbyopia; Mrx spectacle prescription;  CTL contact lenses; OD right eye; OS left eye; OU both eyes  XT exotropia; ET esotropia; PEK punctate epithelial keratitis; PEE punctate epithelial erosions; DES dry eye syndrome; MGD meibomian gland dysfunction; ATs artificial tears; PFAT's preservative free artificial tears; [place] nuclear sclerotic cataract; PSC posterior subcapsular cataract; ERM epi-retinal membrane; PVD posterior vitreous detachment; RD retinal detachment; DM diabetes mellitus; DR diabetic retinopathy; NPDR non-proliferative diabetic retinopathy; PDR proliferative diabetic retinopathy; CSME clinically significant macular edema; DME diabetic macular edema; dbh dot blot hemorrhages; CWS cotton wool spot; POAG primary open angle glaucoma; C/D cup-to-disc ratio; HVF humphrey visual field; GVF goldmann visual field; OCT optical coherence tomography; IOP intraocular pressure; BRVO Branch retinal vein occlusion; CRVO central retinal vein occlusion; CRAO central retinal artery occlusion; BRAO branch retinal artery occlusion; RT retinal tear; SB scleral buckle; PPV pars plana vitrectomy; VH Vitreous hemorrhage; PRP panretinal laser photocoagulation; IVK  intravitreal kenalog; VMT vitreomacular traction; MH Macular hole;  NVD neovascularization of the disc; NVE neovascularization elsewhere; AREDS age related eye disease study; ARMD age related macular degeneration; POAG primary open angle glaucoma; EBMD epithelial/anterior basement membrane dystrophy; ACIOL anterior chamber intraocular lens; IOL intraocular lens; PCIOL posterior chamber intraocular lens; Phaco/IOL phacoemulsification with intraocular lens placement; [place] photorefractive keratectomy; LASIK laser assisted in situ keratomileusis; HTN hypertension; DM diabetes mellitus; COPD chronic obstructive pulmonary disease

## 2019-10-30 NOTE — Assessment & Plan Note (Signed)
Controlled on intravitreal Avastin, will repeat injection today and examination in 6 to 8 weeks

## 2019-12-18 ENCOUNTER — Encounter (INDEPENDENT_AMBULATORY_CARE_PROVIDER_SITE_OTHER): Payer: Self-pay | Admitting: Ophthalmology

## 2019-12-18 ENCOUNTER — Other Ambulatory Visit: Payer: Self-pay

## 2019-12-18 ENCOUNTER — Ambulatory Visit (INDEPENDENT_AMBULATORY_CARE_PROVIDER_SITE_OTHER): Payer: Medicare Other | Admitting: Ophthalmology

## 2019-12-18 ENCOUNTER — Encounter (INDEPENDENT_AMBULATORY_CARE_PROVIDER_SITE_OTHER): Payer: Medicare Other | Admitting: Ophthalmology

## 2019-12-18 DIAGNOSIS — H34832 Tributary (branch) retinal vein occlusion, left eye, with macular edema: Secondary | ICD-10-CM

## 2019-12-18 MED ORDER — BEVACIZUMAB CHEMO INJECTION 1.25MG/0.05ML SYRINGE FOR KALEIDOSCOPE
1.2500 mg | INTRAVITREAL | Status: AC | PRN
  Administered 2019-12-18: 1.25 mg via INTRAVITREAL

## 2019-12-18 NOTE — Assessment & Plan Note (Signed)
CME from macular BRVO, worse left eye at 7-week interval after injection intravitreal Avastin, will repeat today and examination in 6 weeks

## 2019-12-18 NOTE — Patient Instructions (Signed)
Patient instructed to contact office promptly for new onset visual acuity decline or distortion
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
## 2019-12-23 ENCOUNTER — Ambulatory Visit: Payer: Medicare Other

## 2020-02-02 ENCOUNTER — Other Ambulatory Visit: Payer: Self-pay

## 2020-02-02 ENCOUNTER — Ambulatory Visit (INDEPENDENT_AMBULATORY_CARE_PROVIDER_SITE_OTHER): Payer: Medicare Other | Admitting: Ophthalmology

## 2020-02-02 ENCOUNTER — Encounter (INDEPENDENT_AMBULATORY_CARE_PROVIDER_SITE_OTHER): Payer: Self-pay | Admitting: Ophthalmology

## 2020-02-02 DIAGNOSIS — H34832 Tributary (branch) retinal vein occlusion, left eye, with macular edema: Secondary | ICD-10-CM

## 2020-02-02 DIAGNOSIS — H401133 Primary open-angle glaucoma, bilateral, severe stage: Secondary | ICD-10-CM | POA: Diagnosis not present

## 2020-02-02 MED ORDER — BEVACIZUMAB CHEMO INJECTION 1.25MG/0.05ML SYRINGE FOR KALEIDOSCOPE
1.2500 mg | INTRAVITREAL | Status: AC | PRN
  Administered 2020-02-02: 1.25 mg via INTRAVITREAL

## 2020-02-02 NOTE — Assessment & Plan Note (Signed)
Improved CME from BRVO at 7-week interval today post intravitreal Avastin, will repeat again today
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
## 2020-02-02 NOTE — Assessment & Plan Note (Signed)
Continue on topical medications as scheduled

## 2020-03-15 DIAGNOSIS — Z961 Presence of intraocular lens: Secondary | ICD-10-CM | POA: Diagnosis not present

## 2020-03-15 DIAGNOSIS — H401130 Primary open-angle glaucoma, bilateral, stage unspecified: Secondary | ICD-10-CM | POA: Diagnosis not present

## 2020-03-15 DIAGNOSIS — H348322 Tributary (branch) retinal vein occlusion, left eye, stable: Secondary | ICD-10-CM | POA: Diagnosis not present

## 2020-03-22 ENCOUNTER — Other Ambulatory Visit: Payer: Self-pay

## 2020-03-22 ENCOUNTER — Encounter (INDEPENDENT_AMBULATORY_CARE_PROVIDER_SITE_OTHER): Payer: Self-pay | Admitting: Ophthalmology

## 2020-03-22 ENCOUNTER — Ambulatory Visit (INDEPENDENT_AMBULATORY_CARE_PROVIDER_SITE_OTHER): Payer: Medicare Other | Admitting: Ophthalmology

## 2020-03-22 DIAGNOSIS — H34832 Tributary (branch) retinal vein occlusion, left eye, with macular edema: Secondary | ICD-10-CM

## 2020-03-22 DIAGNOSIS — H35352 Cystoid macular degeneration, left eye: Secondary | ICD-10-CM

## 2020-03-22 MED ORDER — BEVACIZUMAB 2.5 MG/0.1ML IZ SOSY
2.5000 mg | PREFILLED_SYRINGE | INTRAVITREAL | Status: AC | PRN
  Administered 2020-03-22: 2.5 mg via INTRAVITREAL

## 2020-03-22 NOTE — Assessment & Plan Note (Addendum)
The nature of branch retinal vein occlusion with macular edema was discussed.  The patient was given access to printed information.  The treatment options including continued observation looking for spontaneous resolution versus grid laser versus intravitreal Kenalog injection were discussed.  PRIMARY THERAPY CONSISTS of Anti-VEGF Therapies, AVASTIN, LUCENTIS AND EYLEA.  Their usage was discussed to assist in halting the progression of Macular Edema, in order to preserve, protect or improve acuity.  Additionally, at times, limited focal laser therapy is used in the management.  The risks and benefits of all these options were discussed with the patient.  The patient's questions were answered.  Prove CME OS as compared to October 2021, at 7-week follow-up, repeat injection Avastin today
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
## 2020-03-22 NOTE — Assessment & Plan Note (Signed)
Improved OS on Avastin, 7-week secondary to BRVO, repeated today

## 2020-04-08 ENCOUNTER — Other Ambulatory Visit: Payer: Self-pay | Admitting: Family Medicine

## 2020-05-10 ENCOUNTER — Ambulatory Visit (INDEPENDENT_AMBULATORY_CARE_PROVIDER_SITE_OTHER): Payer: Medicare Other | Admitting: Ophthalmology

## 2020-05-10 ENCOUNTER — Encounter (INDEPENDENT_AMBULATORY_CARE_PROVIDER_SITE_OTHER): Payer: Self-pay | Admitting: Ophthalmology

## 2020-05-10 ENCOUNTER — Other Ambulatory Visit: Payer: Self-pay

## 2020-05-10 DIAGNOSIS — H34832 Tributary (branch) retinal vein occlusion, left eye, with macular edema: Secondary | ICD-10-CM

## 2020-05-10 MED ORDER — BEVACIZUMAB 2.5 MG/0.1ML IZ SOSY
2.5000 mg | PREFILLED_SYRINGE | INTRAVITREAL | Status: AC | PRN
  Administered 2020-05-10: 2.5 mg via INTRAVITREAL

## 2020-05-10 NOTE — Progress Notes (Signed)
05/10/2020     CHIEF COMPLAINT Patient presents for Retina Follow Up (7 Week F/U OS, poss Avastin OS//Pt reports intermittent cloudiness OS. Pt denies new symptoms OU.)   HISTORY OF PRESENT ILLNESS: Oscar Greene is a 70 y.o. male who presents to the clinic today for:   HPI    Retina Follow Up    Patient presents with  CRVO/BRVO.  In left eye.  This started 7 weeks ago.  Severity is mild.  Duration of 7 weeks.  Since onset it is stable. Additional comments: 7 Week F/U OS, poss Avastin OS  Pt reports intermittent cloudiness OS. Pt denies new symptoms OU.       Last edited by Rockie Neighbours, [place] on 05/10/2020  9:29 AM. (History)      Referring physician: [name], MD [id number] [facility] [address] [place],  [place] [address]  HISTORICAL INFORMATION:   Selected notes from the MEDICAL RECORD NUMBER    Lab Results  Component Value Date   HGBA1C 5.0 11/18/2013     CURRENT MEDICATIONS: Current Outpatient Medications (Ophthalmic Drugs)  Medication Sig  . brimonidine (ALPHAGAN) 0.2 % ophthalmic solution 1 drop 2 (two) times daily.  . dorzolamide-timolol (COSOPT) 22.3-6.8 MG/ML ophthalmic solution 1 DROP IN BOTH EYES TWICE A DAY  . latanoprost (XALATAN) 0.005 % ophthalmic solution Place 1 drop into both eyes daily.   No current facility-administered medications for this visit. (Ophthalmic Drugs)   Current Outpatient Medications (Other)  Medication Sig  . cloNIDine (CATAPRES) 0.1 MG tablet TAKE 1 TABLET BY MOUTH TWICE A DAY  . clopidogrel (PLAVIX) 75 MG tablet TAKE 1 TABLET BY MOUTH EVERY DAY   No current facility-administered medications for this visit. (Other)      REVIEW OF SYSTEMS:    ALLERGIES No Known Allergies  PAST MEDICAL HISTORY Past Medical History:  Diagnosis Date  . AKI (acute kidney injury) ([place]) 06/2015  . Cataract   . CVA (cerebral vascular accident) ([place])   . Glaucoma   . Headache(784.0)   . History of traumatic head injury 03/14/1983   A  pitching wedge hit him in left frontal area  . Hypertension   . Osteoporosis   . Prostate cancer [facility])    Dr. Pilar Jarvis, Gleason 7  . Thrombocytopenia ([place])    Past Surgical History:  Procedure Laterality Date  . CATARACT EXTRACTION W/PHACO Bilateral 2013   Dr. Katy Fitch  . L frontal craniotomy to remove blood clot      FAMILY HISTORY Family History  Problem Relation Age of Onset  . Cancer Mother   . Hypertension Father     SOCIAL HISTORY Social History   Tobacco Use  . Smoking status: Current Every Day Smoker    Packs/day: 1.00    Years: 45.00    Pack years: 45.00    Types: Cigarettes  . Smokeless tobacco: Never Used  Substance Use Topics  . Alcohol use: Yes    Comment: occasional  . Drug use: No         OPHTHALMIC EXAM:  Base Eye Exam    Visual Acuity (ETDRS)      Right Left   Dist [place] 20/40 +1 20/40 +2   Dist ph [place] NI 20/30 +2       Tonometry (Tonopen, 9:29 AM)      Right Left   Pressure 08 09       Pupils      Pupils Dark Light Shape React APD   Right  PERRL 5 5 Round Minimal None   Left PERRL 5 5 Round Minimal None       Visual Fields (Counting fingers)      Left Right   Restrictions Total superior temporal, inferior temporal deficiencies Partial outer inferior nasal deficiency       Extraocular Movement      Right Left    Full Full       Neuro/Psych    Oriented x3: Yes   Mood/Affect: Normal       Dilation    Left eye: 1.0% Mydriacyl, 2.5% Phenylephrine @ 9:32 AM        Slit Lamp and Fundus Exam    External Exam      Right Left   External Normal Normal       Slit Lamp Exam      Right Left   Lids/Lashes Normal Normal   Conjunctiva/Sclera White and quiet White and quiet   Cornea Clear Clear   Anterior Chamber Deep and quiet Deep and quiet   Iris Round and reactive Round and reactive   Lens Posterior chamber intraocular lens Posterior chamber intraocular lens   Anterior Vitreous Normal Normal       Fundus Exam      Right Left    Posterior Vitreous  Posterior vitreous detachment   Disc  Collaterals on the nerve, no nvd   C/D Ratio  0.8   Macula  Microaneurysms, Hemorrhage, Cystoid macular edema   Vessels  Old central retinal vein occlusion   Periphery  Normal, no neovascularization          IMAGING AND PROCEDURES  Imaging and Procedures for 05/10/20  OCT, Retina - OU - Both Eyes       Right Eye Quality was good. Scan locations included subfoveal. Central Foveal Thickness: 242. Progression has been stable. Findings include normal foveal contour.   Left Eye Quality was good. Central Foveal Thickness: 249. Progression has improved. Findings include abnormal foveal contour, cystoid macular edema.   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  The patient was given access to printed information.  The treatment options including continued observation looking for spontaneous resolution versus grid laser versus intravitreal Kenalog injection were discussed.  PRIMARY THERAPY CONSISTS of Anti-VEGF Therapies, AVASTIN, LUCENTIS AND EYLEA.  Their usage was discussed to assist in halting the progression of Macular Edema, in order to preserve, protect or improve acuity.  Additionally, at times, limited focal laser therapy is used in the management.  The risks and benefits of all these options were discussed with the patient.  The patient's questions were answered.        ICD-10-CM   1. 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  Macular edema left eye continues to remain stable with stabilize visual acuity on intravitreal  Avastin.  Multiple recurrences in the past with attempted duration of follow-up intervals beyond 7 weeks.  We will repeat injection today and examination again left eye in 7 weeks  2.  3.  Ophthalmic Meds Ordered  this visit:  No orders of the defined types were placed in this encounter.      No follow-ups on file.  There are no Patient Instructions on file for this visit.   Explained the diagnoses, plan, and follow up with the patient and they expressed understanding.  Patient expressed understanding of the importance of proper follow up care.   Clent Demark Dwana Garin M.D. Diseases & Surgery of the Retina and Vitreous Retina & Diabetic [place] 05/10/20     Abbreviations: M myopia (nearsighted); A astigmatism; H hyperopia (farsighted); P presbyopia; Mrx spectacle prescription;  CTL contact lenses; OD right eye; OS left eye; OU both eyes  XT exotropia; ET esotropia; PEK punctate epithelial keratitis; PEE punctate epithelial erosions; DES dry eye syndrome; MGD meibomian gland dysfunction; ATs artificial tears; PFAT's preservative free artificial tears; [place] nuclear sclerotic cataract; PSC posterior subcapsular cataract; ERM epi-retinal membrane; PVD posterior vitreous detachment; RD retinal detachment; DM diabetes mellitus; DR diabetic retinopathy; NPDR non-proliferative diabetic retinopathy; PDR proliferative diabetic retinopathy; CSME clinically significant macular edema; DME diabetic macular edema; dbh dot blot hemorrhages; CWS cotton wool spot; POAG primary open angle glaucoma; C/D cup-to-disc ratio; HVF humphrey visual field; GVF goldmann visual field; OCT optical coherence tomography; IOP intraocular pressure; BRVO Branch retinal vein occlusion; CRVO central retinal vein occlusion; CRAO central retinal artery occlusion; BRAO branch retinal artery occlusion; RT retinal tear; SB scleral buckle; PPV pars plana vitrectomy; VH Vitreous hemorrhage; PRP panretinal laser photocoagulation; IVK  intravitreal kenalog; VMT vitreomacular traction; MH Macular hole;  NVD neovascularization of the disc; NVE neovascularization elsewhere; AREDS age related eye disease study; ARMD age related macular degeneration; POAG primary open angle glaucoma; EBMD epithelial/anterior basement membrane dystrophy; ACIOL anterior chamber intraocular lens; IOL intraocular lens; PCIOL posterior chamber intraocular lens; Phaco/IOL phacoemulsification with intraocular lens placement; PRK photorefractive keratectomy; LASIK laser assisted in situ keratomileusis; HTN hypertension; DM diabetes mellitus; COPD chronic obstructive pulmonary disease   05/10/2020     CHIEF COMPLAINT Patient presents for Retina Follow Up (7 Week F/U OS, poss Avastin OS//Pt reports intermittent cloudiness OS. Pt denies new symptoms OU.)   HISTORY OF PRESENT ILLNESS: Oscar Greene is a 70 y.o. male who presents to the clinic today for:   HPI    Retina Follow Up    Patient presents with  CRVO/BRVO.  In left eye.  This started 7 weeks ago.  Severity is mild.  Duration of 7 weeks.  Since onset it is stable. Additional comments: 7 Week F/U OS, poss Avastin OS  Pt reports intermittent cloudiness OS. Pt denies new symptoms OU.       Last edited by Rockie Neighbours, [place] on 05/10/2020  9:29 AM. (History)      Referring physician: [name], MD [address] [facility] [address] [place],  [place] [address]  HISTORICAL INFORMATION:   Selected notes from the MEDICAL RECORD NUMBER    Lab Results  Component Value Date   HGBA1C 5.0 11/18/2013     CURRENT MEDICATIONS: Current Outpatient Medications (Ophthalmic Drugs)  Medication Sig  . brimonidine (ALPHAGAN) 0.2 % ophthalmic solution 1 drop 2 (two) times daily.  . dorzolamide-timolol (COSOPT) 22.3-6.8 MG/ML ophthalmic solution 1 DROP IN BOTH EYES TWICE A DAY  . latanoprost (XALATAN) 0.005 % ophthalmic solution Place 1 drop into both eyes daily.   No current facility-administered medications for  this visit. (Ophthalmic Drugs)   Current Outpatient Medications (Other)  Medication Sig  . cloNIDine (CATAPRES) 0.1 MG tablet TAKE 1 TABLET BY MOUTH  TWICE A DAY  . clopidogrel (PLAVIX) 75 MG tablet TAKE 1 TABLET BY MOUTH EVERY DAY   No current facility-administered medications for this visit. (Other)      REVIEW OF SYSTEMS:    ALLERGIES No Known Allergies  PAST MEDICAL HISTORY Past Medical History:  Diagnosis Date  . AKI (acute kidney injury) ([place]) 06/2015  . Cataract   . CVA (cerebral vascular accident) ([place])   . Glaucoma   . Headache(784.0)   . History of traumatic head injury 03/14/1983   A pitching wedge hit him in left frontal area  . Hypertension   . Osteoporosis   . 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 Thrombocytopenia ([place])    Past Surgical History:  Procedure Laterality Date  . CATARACT EXTRACTION W/PHACO Bilateral 2013   Dr. Katy Fitch  . L frontal craniotomy to remove blood clot      FAMILY HISTORY Family History  Problem Relation Age of Onset  . Cancer Mother   . Hypertension Father     SOCIAL HISTORY Social History   Tobacco Use  . Smoking status: Current Every Day Smoker    Packs/day: 1.00    Years: 45.00    Pack years: 45.00    Types: Cigarettes  . Smokeless tobacco: Never Used  Substance Use Topics  . Alcohol use: Yes    Comment: occasional  . Drug use: No         OPHTHALMIC EXAM:  Base Eye Exam    Visual Acuity (ETDRS)      Right Left   Dist [place] 20/40 +1 20/40 +2   Dist ph [place] NI 20/30 +2       Tonometry (Tonopen, 9:29 AM)      Right Left   Pressure 08 09       Pupils      Pupils Dark Light Shape React APD   Right PERRL 5 5 Round Minimal None   Left PERRL 5 5 Round Minimal None       Visual Fields (Counting fingers)      Left Right   Restrictions Total superior temporal, inferior temporal deficiencies Partial outer inferior nasal deficiency       Extraocular Movement      Right Left    Full Full        Neuro/Psych    Oriented x3: Yes   Mood/Affect: Normal       Dilation    Left eye: 1.0% Mydriacyl, 2.5% Phenylephrine @ 9:32 AM        Slit Lamp and Fundus Exam    External Exam      Right Left   External Normal Normal       Slit Lamp Exam      Right Left   Lids/Lashes Normal Normal   Conjunctiva/Sclera White and quiet White and quiet   Cornea Clear Clear   Anterior Chamber Deep and quiet Deep and quiet   Iris Round and reactive Round and reactive   Lens Posterior chamber intraocular lens Posterior chamber intraocular lens   Anterior Vitreous Normal Normal       Fundus Exam      Right Left   Posterior Vitreous  Posterior vitreous detachment   Disc  Collaterals on the nerve, no nvd   C/D Ratio  0.8   Macula  Microaneurysms, Hemorrhage, Cystoid macular edema   Vessels  Old central retinal vein occlusion   Periphery  Normal, no neovascularization  IMAGING AND PROCEDURES  Imaging and Procedures for 05/10/20  OCT, Retina - OU - Both Eyes       Right Eye Quality was good. Scan locations included subfoveal. Central Foveal Thickness: 242. Progression has been stable. Findings include normal foveal contour.   Left Eye Quality was good. Central Foveal Thickness: 249. Progression has improved. Findings include abnormal foveal contour, cystoid macular edema.   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  The patient was given access to printed information.  The treatment options including continued observation looking for spontaneous resolution versus grid laser versus intravitreal Kenalog injection were discussed.  PRIMARY THERAPY  CONSISTS of Anti-VEGF Therapies, AVASTIN, LUCENTIS AND EYLEA.  Their usage was discussed to assist in halting the progression of Macular Edema, in order to preserve, protect or improve acuity.  Additionally, at times, limited focal laser therapy is used in the management.  The risks and benefits of all these options were discussed with the patient.  The patient's questions were answered.        ICD-10-CM   1. 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  Improved CME secondary to macular branch retinal vein occlusion OS.  At 7-week interval today.  Vastly improved as compared to October 2020 1 repeat injection today and maintain 7-week follow-up  2.  3.  Ophthalmic Meds Ordered this visit:  No orders of the defined types were placed in this encounter.      No follow-ups on file.  There are no Patient Instructions on file for this visit.   Explained the diagnoses, plan, and follow up with the patient and they expressed understanding.  Patient expressed understanding of the importance of proper follow up care.   Clent Demark Devina Bezold M.D. Diseases & Surgery of the Retina and Vitreous Retina & Diabetic [place] 05/10/20     Abbreviations: M myopia (nearsighted); A astigmatism; H hyperopia (farsighted); P presbyopia; Mrx spectacle prescription;  CTL contact lenses; OD right eye; OS left eye; OU both eyes  XT exotropia; ET esotropia; PEK punctate epithelial keratitis; PEE punctate epithelial erosions; DES dry eye syndrome; MGD meibomian gland dysfunction; ATs artificial tears; PFAT's preservative free artificial tears; [place] nuclear sclerotic cataract; PSC posterior subcapsular cataract; ERM epi-retinal membrane; PVD posterior vitreous detachment; RD retinal detachment; DM diabetes mellitus; DR diabetic retinopathy; NPDR non-proliferative diabetic retinopathy; PDR proliferative diabetic retinopathy; CSME clinically significant  macular edema; DME diabetic macular edema; dbh dot blot hemorrhages; CWS cotton wool spot; POAG primary open angle glaucoma; C/D cup-to-disc ratio; HVF humphrey visual field; GVF goldmann visual field; OCT optical coherence tomography; IOP intraocular pressure; BRVO Branch retinal vein occlusion; CRVO central retinal vein occlusion; CRAO central retinal artery occlusion; BRAO branch retinal artery occlusion; RT retinal tear; SB scleral buckle; PPV pars plana vitrectomy; VH Vitreous hemorrhage; PRP panretinal laser photocoagulation; IVK intravitreal kenalog; VMT vitreomacular traction; MH Macular hole;  NVD neovascularization of the disc; NVE neovascularization elsewhere; AREDS age related eye disease study; ARMD age related macular degeneration; POAG primary open angle glaucoma; EBMD epithelial/anterior basement  membrane dystrophy; ACIOL anterior chamber intraocular lens; IOL intraocular lens; PCIOL posterior chamber intraocular lens; Phaco/IOL phacoemulsification with intraocular lens placement; [place] photorefractive keratectomy; LASIK laser assisted in situ keratomileusis; HTN hypertension; DM diabetes mellitus; COPD chronic obstructive pulmonary disease

## 2020-05-10 NOTE — Assessment & Plan Note (Signed)
The nature of branch retinal vein occlusion with macular edema was discussed.  The patient was given access to printed information.  The treatment options including continued observation looking for spontaneous resolution versus grid laser versus intravitreal Kenalog injection were discussed.  PRIMARY THERAPY CONSISTS of Anti-VEGF Therapies, AVASTIN, LUCENTIS AND EYLEA.  Their usage was discussed to assist in halting the progression of Macular Edema, in order to preserve, protect or improve acuity.  Additionally, at times, limited focal laser therapy is used in the management.  The risks and benefits of all these options were discussed with the patient.  The patient's questions were answered. 

## 2020-06-24 ENCOUNTER — Telehealth: Payer: Self-pay | Admitting: Family Medicine

## 2020-06-24 ENCOUNTER — Other Ambulatory Visit: Payer: Self-pay

## 2020-06-24 MED ORDER — CLOPIDOGREL BISULFATE 75 MG PO TABS: 1.0000 | ORAL_TABLET | Freq: Every day | ORAL | 3 refills | Status: DC

## 2020-06-24 NOTE — Telephone Encounter (Signed)
Med sent.

## 2020-06-24 NOTE — Telephone Encounter (Signed)
Received call from patient's sister Voncille Lo to request refill of medication (Rx expired: pt received call from pharmacy). Manuela Schwartz is unsure of the name of the medication  Pharmacy:   [id number] - [place], [place] - [address]  [address] [place] [address]  Phone:  [phone] Fax:  [phone]  DEA #:  [id number]  Please advise Manuela Schwartz at [phone] when Rx called in.

## 2020-06-28 ENCOUNTER — Encounter (INDEPENDENT_AMBULATORY_CARE_PROVIDER_SITE_OTHER): Payer: Medicare Other | Admitting: Ophthalmology

## 2020-06-28 ENCOUNTER — Ambulatory Visit (INDEPENDENT_AMBULATORY_CARE_PROVIDER_SITE_OTHER): Payer: Medicare Other | Admitting: Ophthalmology

## 2020-06-28 ENCOUNTER — Encounter (INDEPENDENT_AMBULATORY_CARE_PROVIDER_SITE_OTHER): Payer: Self-pay | Admitting: Ophthalmology

## 2020-06-28 ENCOUNTER — Other Ambulatory Visit: Payer: Self-pay

## 2020-06-28 DIAGNOSIS — H34832 Tributary (branch) retinal vein occlusion, left eye, with macular edema: Secondary | ICD-10-CM | POA: Diagnosis not present

## 2020-06-28 MED ORDER — BEVACIZUMAB 2.5 MG/0.1ML IZ SOSY
2.5000 mg | PREFILLED_SYRINGE | INTRAVITREAL | Status: AC | PRN
  Administered 2020-06-28: 2.5 mg via INTRAVITREAL

## 2020-06-28 NOTE — Assessment & Plan Note (Signed)
BRVO, chronic recurrent CME OS, stabilized and improved at 7-week follow-up.  We will repeat injection today and again evaluate in 7 weeks
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
## 2020-08-16 ENCOUNTER — Other Ambulatory Visit: Payer: Self-pay

## 2020-08-16 ENCOUNTER — Ambulatory Visit (INDEPENDENT_AMBULATORY_CARE_PROVIDER_SITE_OTHER): Payer: Medicare Other | Admitting: Ophthalmology

## 2020-08-16 ENCOUNTER — Encounter (INDEPENDENT_AMBULATORY_CARE_PROVIDER_SITE_OTHER): Payer: Self-pay | Admitting: Ophthalmology

## 2020-08-16 DIAGNOSIS — H34832 Tributary (branch) retinal vein occlusion, left eye, with macular edema: Secondary | ICD-10-CM | POA: Diagnosis not present

## 2020-08-16 MED ORDER — BEVACIZUMAB 2.5 MG/0.1ML IZ SOSY
2.5000 mg | PREFILLED_SYRINGE | INTRAVITREAL | Status: AC | PRN
  Administered 2020-08-16: 2.5 mg via INTRAVITREAL

## 2020-08-16 NOTE — Assessment & Plan Note (Signed)
The nature of branch retinal vein occlusion with macular edema was discussed.  The patient was given access to printed information.  The treatment options including continued observation looking for spontaneous resolution versus grid laser versus intravitreal Kenalog injection were discussed.  PRIMARY THERAPY CONSISTS of Anti-VEGF Therapies, AVASTIN, LUCENTIS AND EYLEA.  Their usage was discussed to assist in halting the progression of Macular Edema, in order to preserve, protect or improve acuity.  Additionally, at times, limited focal laser therapy is used in the management.  The risks and benefits of all these options were discussed with the patient.  The patient's questions were answered. 
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
## 2020-09-03 ENCOUNTER — Ambulatory Visit: Payer: Medicare Other | Admitting: Family Medicine

## 2020-10-04 ENCOUNTER — Ambulatory Visit (INDEPENDENT_AMBULATORY_CARE_PROVIDER_SITE_OTHER): Payer: Medicare Other | Admitting: Ophthalmology

## 2020-10-04 ENCOUNTER — Encounter (INDEPENDENT_AMBULATORY_CARE_PROVIDER_SITE_OTHER): Payer: Self-pay | Admitting: Ophthalmology

## 2020-10-04 ENCOUNTER — Other Ambulatory Visit: Payer: Self-pay

## 2020-10-04 ENCOUNTER — Encounter (INDEPENDENT_AMBULATORY_CARE_PROVIDER_SITE_OTHER): Payer: Medicare Other | Admitting: Ophthalmology

## 2020-10-04 DIAGNOSIS — H34832 Tributary (branch) retinal vein occlusion, left eye, with macular edema: Secondary | ICD-10-CM | POA: Diagnosis not present

## 2020-10-04 MED ORDER — BEVACIZUMAB 2.5 MG/0.1ML IZ SOSY
2.5000 mg | PREFILLED_SYRINGE | INTRAVITREAL | Status: AC | PRN
  Administered 2020-10-04: 2.5 mg via INTRAVITREAL

## 2020-10-04 NOTE — Assessment & Plan Note (Signed)
The nature of branch retinal vein occlusion with macular edema was discussed.  The patient was given access to printed information.  The treatment options including continued observation looking for spontaneous resolution versus grid laser versus intravitreal Kenalog injection were discussed.  PRIMARY THERAPY CONSISTS of Anti-VEGF Therapies, AVASTIN, LUCENTIS AND EYLEA.  Their usage was discussed to assist in halting the progression of Macular Edema, in order to preserve, protect or improve acuity.  Additionally, at times, limited focal laser therapy is used in the management.  The risks and benefits of all these options were discussed with the patient.  The patient's questions were answered. 

## 2020-10-04 NOTE — Progress Notes (Signed)
10/04/2020     CHIEF COMPLAINT Patient presents for Retina Follow Up (7 week fu OS and Avastin PS/Pt states VA OU stable since last visit. Pt denies FOL, floaters, or ocular pain OU. /Pt reports using Brimonidine BID OU, Cosopt BID OU and Latanaprost QHS OU/)   HISTORY OF PRESENT ILLNESS: Oscar Greene is a 70 y.o. male who presents to the clinic today for:   HPI     Retina Follow Up           Diagnosis: CRVO/BRVO   Laterality: left eye   Onset: 7 weeks ago   Severity: mild   Duration: 7 weeks   Course: stable   Comments: 7 week fu OS and Avastin PS Pt states VA OU stable since last visit. Pt denies FOL, floaters, or ocular pain OU.  Pt reports using Brimonidine BID OU, Cosopt BID OU and Latanaprost QHS OU        Last edited by Kendra Opitz, COA on 10/04/2020  8:52 AM.      Referring physician: [name], MD [address] [place],  [place] [address]  HISTORICAL INFORMATION:   Selected notes from the MEDICAL RECORD NUMBER    Lab Results  Component Value Date   HGBA1C 5.0 11/18/2013     CURRENT MEDICATIONS: Current Outpatient Medications (Ophthalmic Drugs)  Medication Sig   brimonidine (ALPHAGAN) 0.2 % ophthalmic solution 1 drop 2 (two) times daily.   dorzolamide-timolol (COSOPT) 22.3-6.8 MG/ML ophthalmic solution 1 DROP IN BOTH EYES TWICE A DAY   latanoprost (XALATAN) 0.005 % ophthalmic solution Place 1 drop into both eyes daily.   No current facility-administered medications for this visit. (Ophthalmic Drugs)   Current Outpatient Medications (Other)  Medication Sig   cloNIDine (CATAPRES) 0.1 MG tablet TAKE 1 TABLET BY MOUTH TWICE A DAY   clopidogrel (PLAVIX) 75 MG tablet Take 1 tablet (75 mg total) by mouth daily.   No current facility-administered medications for this visit. (Other)      REVIEW OF SYSTEMS:    ALLERGIES No Known Allergies  PAST MEDICAL HISTORY Past Medical History:  Diagnosis Date   AKI (acute kidney  injury) ([place]) 06/2015   Cataract    CVA (cerebral vascular accident) ([place])    Glaucoma    Headache(784.0)    History of traumatic head injury 03/14/1983   A pitching wedge hit him in left frontal area   Hypertension    Osteoporosis    Prostate cancer ([place])    Dr. Pilar Jarvis, Gleason 7   Thrombocytopenia [facility])    Past Surgical History:  Procedure Laterality Date   CATARACT EXTRACTION W/PHACO Bilateral 2013   Dr. Katy Fitch   L frontal craniotomy to remove blood clot      FAMILY HISTORY Family History  Problem Relation Age of Onset   Cancer Mother    Hypertension Father     SOCIAL HISTORY Social History   Tobacco Use   Smoking status: Every Day    Packs/day: 1.00    Years: 45.00    Pack years: 45.00    Types: Cigarettes   Smokeless tobacco: Never  Substance Use Topics   Alcohol use: Yes    Comment: occasional   Drug use: No         OPHTHALMIC EXAM:  Base Eye Exam     Visual Acuity (ETDRS)       Right Left   Dist [place] 20/40 -1 20/40   Dist ph [place] 20/25 NI  Tonometry (Tonopen, 8:57 AM)       Right Left   Pressure 14 12         Pupils       Pupils Dark Light Shape React APD   Right PERRL 6 5 Round Sluggish None   Left PERRL 6 5 Round Sluggish None         Visual Fields       Left Right   Restrictions Total superior temporal, inferior temporal deficiencies Total inferior nasal deficiency         Neuro/Psych     Oriented x3: Yes   Mood/Affect: Normal         Dilation     Left eye: 1.0% Mydriacyl, 2.5% Phenylephrine @ 8:57 AM           Slit Lamp and Fundus Exam     External Exam       Right Left   External Normal Normal         Slit Lamp Exam       Right Left   Lids/Lashes Normal Normal   Conjunctiva/Sclera White and quiet White and quiet   Cornea Clear Clear   Anterior Chamber Deep and quiet Deep and quiet   Iris Round and reactive Round and reactive   Lens Posterior chamber intraocular lens Posterior chamber  intraocular lens   Anterior Vitreous Normal Normal         Fundus Exam       Right Left   Posterior Vitreous  Posterior vitreous detachment   Disc  Collaterals on the nerve, no nvd   C/D Ratio  0.8   Macula  Microaneurysms, Hemorrhage, Cystoid macular edema   Vessels  Old central retinal vein occlusion   Periphery  Normal, no neovascularization            IMAGING AND PROCEDURES  Imaging and Procedures for 10/04/20  OCT, Retina - OU - Both Eyes       Right Eye Quality was good. Scan locations included subfoveal. Central Foveal Thickness: 246. Progression has been stable. Findings include normal foveal contour.   Left Eye Quality was good. Scan locations included subfoveal. Central Foveal Thickness: 262. Progression has improved. Findings include abnormal foveal contour, cystoid macular edema.   Notes Recurrent CME, at 7 weeks from [facility] central retinal vein occlusion.  Stable over time.     Intravitreal Injection, Pharmacologic Agent - OS - Left Eye       Time Out 10/04/2020. 9:10 AM. Confirmed correct patient, procedure, site, and patient consented.   Anesthesia Topical anesthesia was used. Anesthetic medications included Akten 3.5%.   Procedure Preparation included 5% betadine to ocular surface, 10% betadine to eyelids, Ofloxacin . A 30 gauge needle was used.   Injection: 2.5 mg bevacizumab 2.5 MG/0.1ML   Route: Intravitreal, Site: Left Eye   NDC: [phone], Lot: [id number]   Post-op Post injection exam found visual acuity of at least counting fingers. The patient tolerated the procedure well. There were no complications. The patient received written and verbal post procedure care education. Post injection medications were not given.              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  The patient was given access to printed  information.  The treatment options including continued observation looking for spontaneous resolution versus grid laser versus intravitreal Kenalog injection were discussed.  PRIMARY THERAPY CONSISTS of Anti-VEGF Therapies, AVASTIN, LUCENTIS AND EYLEA.  Their usage was discussed to assist in halting the progression of Macular Edema, in order to preserve, protect or improve acuity.  Additionally, at times, limited focal laser therapy is used in the management.  The risks and benefits of all these options were discussed with the patient.  The patient's questions were answered.     ICD-10-CM   1. 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  OS, with less CME from BRVO at 7 weeks post injection intravitreal Avastin.  Patient does note around 6 weeks that the vision does start to become more done.  2.  Repeat intravitreal Avastin OS today and examination next in 6 to 7 weeks, with history of multiple recurrences in the past  3.  Ophthalmic Meds Ordered this visit:  Meds ordered this encounter  Medications   bevacizumab (AVASTIN) SOSY 2.5 mg       Return in about 7 weeks (around 11/22/2020) for dilate, OS, AVASTIN OCT.  There are no Patient Instructions on file for this visit.   Explained the diagnoses, plan, and follow up with the patient and they expressed understanding.  Patient expressed understanding of the importance of proper follow up care.   Clent Demark Bailie Christenbury M.D. Diseases & Surgery of the Retina and Vitreous Retina [facility] Diabetic [place] 10/04/20     Abbreviations: M myopia (nearsighted); A astigmatism; H hyperopia (farsighted); P presbyopia; Mrx spectacle prescription;  CTL contact lenses; OD right eye; OS left eye; OU both eyes  XT exotropia; ET esotropia; PEK punctate epithelial keratitis; PEE punctate epithelial  erosions; DES dry eye syndrome; MGD meibomian gland dysfunction; ATs artificial tears; PFAT's preservative free artificial tears; [place] nuclear sclerotic cataract; PSC posterior subcapsular cataract; ERM epi-retinal membrane; PVD posterior vitreous detachment; RD retinal detachment; DM diabetes mellitus; DR diabetic retinopathy; NPDR non-proliferative diabetic retinopathy; PDR proliferative diabetic retinopathy; CSME clinically significant macular edema; DME diabetic macular edema; dbh dot blot hemorrhages; CWS cotton wool spot; POAG primary open angle glaucoma; C/D cup-to-disc ratio; HVF humphrey visual field; GVF goldmann visual field; OCT optical coherence tomography; IOP intraocular pressure; BRVO Branch retinal vein occlusion; CRVO central retinal vein occlusion; CRAO central retinal artery occlusion; BRAO branch retinal artery occlusion; RT retinal tear; SB scleral buckle; PPV pars plana vitrectomy; VH Vitreous hemorrhage; PRP panretinal laser photocoagulation; IVK intravitreal kenalog; VMT vitreomacular traction; MH Macular hole;  NVD neovascularization of the disc; NVE neovascularization elsewhere; AREDS age related eye disease study; ARMD age related macular degeneration; POAG primary open angle glaucoma; EBMD epithelial/anterior basement membrane dystrophy; ACIOL anterior chamber intraocular lens; IOL intraocular lens; PCIOL posterior chamber intraocular lens; Phaco/IOL phacoemulsification with intraocular lens placement; [place] photorefractive keratectomy; LASIK laser assisted in situ keratomileusis; HTN hypertension; DM diabetes mellitus; COPD chronic obstructive pulmonary disease

## 2020-10-07 ENCOUNTER — Ambulatory Visit (HOSPITAL_COMMUNITY)
Admission: EM | Admit: 2020-10-07 | Discharge: 2020-10-07 | Disposition: A | Discharge status: Home / Self Care | Payer: Medicare Other | Attending: Internal Medicine | Admitting: Internal Medicine

## 2020-10-07 ENCOUNTER — Encounter (HOSPITAL_COMMUNITY): Payer: Self-pay

## 2020-10-07 ENCOUNTER — Other Ambulatory Visit: Payer: Self-pay

## 2020-10-07 ENCOUNTER — Ambulatory Visit (INDEPENDENT_AMBULATORY_CARE_PROVIDER_SITE_OTHER): Payer: Medicare Other

## 2020-10-07 DIAGNOSIS — M25562 Pain in left knee: Secondary | ICD-10-CM | POA: Diagnosis not present

## 2020-10-07 DIAGNOSIS — M1712 Unilateral primary osteoarthritis, left knee: Secondary | ICD-10-CM

## 2020-10-07 MED ORDER — PREDNISONE 20 MG PO TABS: 20.0000 mg | ORAL_TABLET | Freq: Every day | ORAL | 0 refills | Status: AC

## 2020-10-07 MED ORDER — ACETAMINOPHEN 500 MG PO TABS: 500.0000 mg | ORAL_TABLET | Freq: Four times a day (QID) | ORAL | 0 refills | Status: AC | PRN

## 2020-10-07 NOTE — Discharge Instructions (Addendum)
Gentle range of motion exercises Please use your knee brace Tylenol as needed for pain If symptoms worsen please return to urgent care I will give you a short course of steroids because you are on antiplatelets and she cannot tolerate NSAIDs.

## 2020-10-07 NOTE — ED Triage Notes (Signed)
Pt presents with pain and swelling in the left knee x 1 day. Reports he was walking yesterday and hear a "click" sound. Pain is worse when walking. Tylenol gives some relief.

## 2020-10-07 NOTE — ED Notes (Signed)
Pt not answered the phone.

## 2020-10-08 NOTE — ED Provider Notes (Signed)
[place]    CSN: [id number] Arrival date & time: 10/07/20  0932      History   Chief Complaint Chief Complaint  Patient presents with   Leg Pain    HPI Oscar Greene is a 70 y.o. male with a history of advanced osteoarthritis comes to urgent care with complaints of left knee pain of 1 day duration.  Patient was walking yesterday when he had a clicking sound in the left knee.  Following that the patient started experiencing pain of moderate severity.  He is able to bear weight.  Knee does not give way.  He has tried Tylenol with no significant improvement.  No bruising on the knee.  No trauma to the knee.  No falls.  No fever or chills.   HPI  Past Medical History:  Diagnosis Date   AKI (acute kidney injury) ([place]) 06/2015   Cataract    CVA (cerebral vascular accident) ([place])    Glaucoma    Headache(784.0)    History of traumatic head injury 03/14/1983   A pitching wedge hit him in left frontal area   Hypertension    Osteoporosis    Prostate cancer ([place])    Dr. 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   Primary open-angle glaucoma, severe stage 06/26/2019   Prostate cancer ([place])    CVA (cerebral vascular accident) ([place])    Dehydration 06/13/2015   AKI (acute kidney injury) ([place]) 06/13/2015   Viral URI with cough 06/13/2015   Thrombocytopenia ([place]) 06/13/2015   Elevated troponin 06/13/2015   Generalized weakness    Smoker 11/26/2013   Diarrhea 11/19/2013   CVA (cerebral infarction) 11/18/2013   Cerebral thrombosis with cerebral infarction ([place]) 11/17/2013   Renal insufficiency 11/17/2013   Cocaine use 11/17/2013   Hypertension 11/17/2013   Glaucoma 11/17/2013   Fungal  skin infection 11/17/2013    Past Surgical History:  Procedure Laterality Date   CATARACT EXTRACTION W/PHACO Bilateral 2013   Dr. Katy Fitch   L frontal craniotomy to remove blood clot         Home Medications    Prior to Admission medications   Medication Sig Start Date End Date Taking? Authorizing Provider  acetaminophen (TYLENOL) 500 MG tablet Take 1 tablet (500 mg total) by mouth every 6 (six) hours as needed. 10/07/20  Yes Kainen Struckman, Myrene Galas, MD  predniSONE (DELTASONE) 20 MG tablet Take 1 tablet (20 mg total) by mouth daily for 5 days. 10/07/20 10/12/20 Yes Sincere Liuzzi, Myrene Galas, MD  brimonidine (ALPHAGAN) 0.2 % ophthalmic solution 1 drop 2 (two) times daily. 04/14/19   [provider]  cloNIDine (CATAPRES) 0.1 MG tablet TAKE 1 TABLET BY MOUTH TWICE A DAY 04/08/20   [name], MD  clopidogrel (PLAVIX) 75 MG tablet Take 1 tablet (75 mg total) by mouth daily. 06/24/20   [name], MD  dorzolamide-timolol (COSOPT) 22.3-6.8 MG/ML ophthalmic solution 1 DROP IN BOTH EYES TWICE A DAY 12/18/14   [provider]  latanoprost (XALATAN) 0.005 % ophthalmic solution Place 1 drop into both eyes daily. 04/02/17   [provider]    Family History Family History  Problem Relation Age of Onset  Cancer Mother    Hypertension Father     Social History Social History   Tobacco Use   Smoking status: Every Day    Packs/day: 1.00    Years: 45.00    Pack years: 45.00    Types: Cigarettes   Smokeless tobacco: Never  Vaping Use   Vaping Use: Never used  Substance Use Topics   Alcohol use: Yes    Comment: occasional   Drug use: No     Allergies   Patient has no known allergies.   Review of Systems Review of Systems  Respiratory: Negative.    Cardiovascular: Negative.   Gastrointestinal: Negative.   Musculoskeletal:  Positive for arthralgias and joint swelling. Negative for gait problem and myalgias.    Physical Exam Triage Vital Signs ED Triage  Vitals  Enc Vitals Group     BP 10/07/20 1041 130/75     Pulse Rate 10/07/20 1041 (!) 55     Resp 10/07/20 1041 18     Temp 10/07/20 1041 98.4 F (36.9 C)     Temp Source 10/07/20 1041 Oral     SpO2 10/07/20 1041 100 %     Weight --      Height --      Head Circumference --      Peak Flow --      Pain Score 10/07/20 1039 9     Pain Loc --      Pain Edu? --      Excl. in [place]? --    No data found.  Updated Vital Signs BP 130/75 (BP Location: Left Arm)   Pulse (!) 55   Temp 98.4 F (36.9 C) (Oral)   Resp 18   SpO2 100%   Visual Acuity Right Eye Distance:   Left Eye Distance:   Bilateral Distance:    Right Eye Near:   Left Eye Near:    Bilateral Near:     Physical Exam Vitals and nursing note reviewed.  Constitutional:      General: He is not in acute distress.    Appearance: He is not ill-appearing.  Cardiovascular:     Rate and Rhythm: Normal rate and regular rhythm.     Pulses: Normal pulses.     Heart sounds: Normal heart sounds.  Musculoskeletal:     Comments: Deformity of the left.  This looks chronic.  Full range of motion with some discomfort.  Anterior and posterior drawer tests are negative.  No tenderness on palpation over the medial collateral ligament.  Patient has some tenderness along the lateral collateral ligaments.  Neurological:     Mental Status: He is alert.     UC Treatments / Results  Labs (all labs ordered are listed, but only abnormal results are displayed) Labs Reviewed - No data to display  EKG   Radiology DG Knee 2 Views Left  Result Date: 10/07/2020 CLINICAL DATA:  Left knee pain EXAM: LEFT KNEE - 1-2 VIEW COMPARISON:  None. FINDINGS: Suboptimal positioning for the study due to deformity. Advanced tricompartmental degenerative change with joint space narrowing. Multiple calcified loose bodies in the joint. Joint effusion. There is depression of the medial tibial plateau. Correlate with prior injury. IMPRESSION: Advanced  tricompartmental degenerative change with loose bodies and joint effusion. Question chronic fracture medial tibial plateau. Electronically Signed   By: Franchot Gallo M.D.   On: 10/07/2020 11:45    Procedures Procedures (including critical care time)  Medications Ordered in UC Medications - No data  to display  Initial Impression / Assessment and Plan / UC Course  I have reviewed the triage vital signs and the nursing notes.  Pertinent labs & imaging results that were available during my care of the patient were reviewed by me and considered in my medical decision making (see chart for details).     1.  Osteoarthritis of the left knee with knee sprain: X-ray of the left knee is negative for acute fracture. Patient has advanced osteoarthritis of the left knee Patient is on Plavix so I will avoid NSAIDs Short course of steroids Tylenol as needed for pain If symptoms worsen patient is advised to come to the urgent care to be reevaluated. Final Clinical Impressions(s) / UC Diagnoses   Final diagnoses:  Osteoarthritis of left knee, unspecified osteoarthritis type     Discharge Instructions      Gentle range of motion exercises Please use your knee brace Tylenol as needed for pain If symptoms worsen please return to urgent care I will give you a short course of steroids because you are on antiplatelets and she cannot tolerate NSAIDs.   ED Prescriptions     Medication Sig Dispense Auth. Provider   predniSONE (DELTASONE) 20 MG tablet Take 1 tablet (20 mg total) by mouth daily for 5 days. 5 tablet Quintan Saldivar, Myrene Galas, MD   acetaminophen (TYLENOL) 500 MG tablet Take 1 tablet (500 mg total) by mouth every 6 (six) hours as needed. 30 tablet Aurora Rody, Myrene Galas, MD      PDMP not reviewed this encounter.   Chase Picket, MD 10/08/20 [id number]

## 2020-10-14 ENCOUNTER — Ambulatory Visit (INDEPENDENT_AMBULATORY_CARE_PROVIDER_SITE_OTHER): Payer: Medicare Other | Admitting: Family Medicine

## 2020-10-14 ENCOUNTER — Encounter: Payer: Self-pay | Admitting: Family Medicine

## 2020-10-14 ENCOUNTER — Other Ambulatory Visit: Payer: Self-pay

## 2020-10-14 VITALS — BP 148/82 | HR 78 | Temp 98.1°F | Resp 16 | Ht 69.0 in | Wt 147.0 lb

## 2020-10-14 DIAGNOSIS — I6522 Occlusion and stenosis of left carotid artery: Secondary | ICD-10-CM | POA: Diagnosis not present

## 2020-10-14 DIAGNOSIS — I6930 Unspecified sequelae of cerebral infarction: Secondary | ICD-10-CM

## 2020-10-14 DIAGNOSIS — I10 Essential (primary) hypertension: Secondary | ICD-10-CM | POA: Diagnosis not present

## 2020-10-14 DIAGNOSIS — I639 Cerebral infarction, unspecified: Secondary | ICD-10-CM

## 2020-10-14 MED ORDER — ROSUVASTATIN CALCIUM 20 MG PO TABS: 20.0000 mg | ORAL_TABLET | Freq: Every day | ORAL | 3 refills | Status: DC
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
## 2020-10-15 ENCOUNTER — Encounter: Payer: Self-pay | Admitting: *Deleted

## 2020-10-15 LAB — COMPLETE METABOLIC PANEL WITH GFR
AG Ratio: 1.9 (calc) (ref 1.0–2.5)
ALT: 12 U/L (ref 9–46)
AST: 17 U/L (ref 10–35)
Albumin: 4.1 g/dL (ref 3.6–5.1)
Alkaline phosphatase (APISO): 50 U/L (ref 35–144)
BUN: 20 mg/dL (ref 7–25)
CO2: 31 mmol/L (ref 20–32)
Calcium: 9 mg/dL (ref 8.6–10.3)
Chloride: 106 mmol/L (ref 98–110)
Creat: 1.06 mg/dL (ref 0.70–1.28)
Globulin: 2.2 g/dL (calc) (ref 1.9–3.7)
Glucose, Bld: 92 mg/dL (ref 65–99)
Potassium: 4.7 mmol/L (ref 3.5–5.3)
Sodium: 142 mmol/L (ref 135–146)
Total Bilirubin: 0.8 mg/dL (ref 0.2–1.2)
Total Protein: 6.3 g/dL (ref 6.1–8.1)
eGFR: 75 mL/min/{1.73_m2} (ref >=60)

## 2020-10-15 LAB — CBC WITH DIFFERENTIAL/PLATELET
Absolute Monocytes: 739 cells/uL (ref 200–950)
Basophils Absolute: 100 cells/uL (ref 0–200)
Basophils Relative: 1.3 %
Eosinophils Absolute: 501 cells/uL — ABNORMAL HIGH (ref 15–500)
Eosinophils Relative: 6.5 %
HCT: 41.5 % (ref 38.5–50.0)
Hemoglobin: 13.2 g/dL (ref 13.2–17.1)
Lymphs Abs: 1594 cells/uL (ref 850–3900)
MCH: 31.4 pg (ref 27.0–33.0)
MCHC: 31.8 g/dL — ABNORMAL LOW (ref 32.0–36.0)
MCV: 98.8 fL (ref 80.0–100.0)
MPV: 12 fL (ref 7.5–12.5)
Monocytes Relative: 9.6 %
Neutro Abs: 4766 cells/uL (ref 1500–7800)
Neutrophils Relative %: 61.9 %
Platelets: 186 10*3/uL (ref 140–400)
RBC: 4.2 10*6/uL (ref 4.20–5.80)
RDW: 11.4 % (ref 11.0–15.0)
Total Lymphocyte: 20.7 %
WBC: 7.7 10*3/uL (ref 3.8–10.8)

## 2020-10-15 LAB — LIPID PANEL
Cholesterol: 145 mg/dL (ref <200)
HDL: 49 mg/dL (ref >=40)
LDL Cholesterol (Calc): 73 mg/dL (calc)
Non-HDL Cholesterol (Calc): 96 mg/dL (calc) (ref <130)
Total CHOL/HDL Ratio: 3 (calc) (ref <5.0)
Triglycerides: 149 mg/dL (ref <150)

## 2020-10-18 ENCOUNTER — Ambulatory Visit
Admission: RE | Admit: 2020-10-18 | Discharge: 2020-10-18 | Disposition: A | Discharge status: Home / Self Care | Payer: Medicare Other | Source: Ambulatory Visit | Attending: Family Medicine | Admitting: Family Medicine

## 2020-10-18 DIAGNOSIS — I6523 Occlusion and stenosis of bilateral carotid arteries: Secondary | ICD-10-CM | POA: Diagnosis not present

## 2020-10-18 DIAGNOSIS — I6522 Occlusion and stenosis of left carotid artery: Secondary | ICD-10-CM

## 2020-10-25 ENCOUNTER — Other Ambulatory Visit: Payer: Self-pay | Admitting: *Deleted

## 2020-11-11 DIAGNOSIS — Z961 Presence of intraocular lens: Secondary | ICD-10-CM | POA: Diagnosis not present

## 2020-11-11 DIAGNOSIS — H401131 Primary open-angle glaucoma, bilateral, mild stage: Secondary | ICD-10-CM | POA: Diagnosis not present

## 2020-11-11 DIAGNOSIS — H348322 Tributary (branch) retinal vein occlusion, left eye, stable: Secondary | ICD-10-CM | POA: Diagnosis not present

## 2020-11-22 ENCOUNTER — Other Ambulatory Visit: Payer: Self-pay

## 2020-11-22 ENCOUNTER — Encounter (INDEPENDENT_AMBULATORY_CARE_PROVIDER_SITE_OTHER): Payer: Self-pay | Admitting: Ophthalmology

## 2020-11-22 ENCOUNTER — Ambulatory Visit (INDEPENDENT_AMBULATORY_CARE_PROVIDER_SITE_OTHER): Payer: Medicare Other | Admitting: Ophthalmology

## 2020-11-22 DIAGNOSIS — H34832 Tributary (branch) retinal vein occlusion, left eye, with macular edema: Secondary | ICD-10-CM | POA: Diagnosis not present

## 2020-11-22 MED ORDER — BEVACIZUMAB 2.5 MG/0.1ML IZ SOSY
2.5000 mg | PREFILLED_SYRINGE | INTRAVITREAL | Status: AC | PRN
  Administered 2020-11-22: 2.5 mg via INTRAVITREAL

## 2020-11-22 NOTE — Assessment & Plan Note (Signed)
OS, vastly improved and stable on intravitreal Avastin and maintain at 7-week interval.  History of multiple recurrences. Continue to watch and monitor superonasal to FAZ.  Repeat injection Avastin today at 7-week interval
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
## 2020-12-27 ENCOUNTER — Other Ambulatory Visit: Payer: Self-pay | Admitting: Family Medicine

## 2021-01-10 ENCOUNTER — Encounter (INDEPENDENT_AMBULATORY_CARE_PROVIDER_SITE_OTHER): Payer: Self-pay | Admitting: Ophthalmology

## 2021-01-10 ENCOUNTER — Other Ambulatory Visit (HOSPITAL_BASED_OUTPATIENT_CLINIC_OR_DEPARTMENT_OTHER): Payer: Self-pay

## 2021-01-10 ENCOUNTER — Other Ambulatory Visit: Payer: Self-pay

## 2021-01-10 ENCOUNTER — Ambulatory Visit: Payer: Medicare Other | Attending: Internal Medicine

## 2021-01-10 ENCOUNTER — Ambulatory Visit (INDEPENDENT_AMBULATORY_CARE_PROVIDER_SITE_OTHER): Payer: Medicare Other | Admitting: Ophthalmology

## 2021-01-10 DIAGNOSIS — Z23 Encounter for immunization: Secondary | ICD-10-CM

## 2021-01-10 DIAGNOSIS — H34832 Tributary (branch) retinal vein occlusion, left eye, with macular edema: Secondary | ICD-10-CM | POA: Diagnosis not present

## 2021-01-10 MED ORDER — MODERNA COVID-19 BIVAL BOOSTER 50 MCG/0.5ML IM SUSP
INTRAMUSCULAR | 0 refills | Status: DC
  Filled 2021-01-10: qty 0.5, 1d supply, fill #0

## 2021-01-10 MED ORDER — BEVACIZUMAB 2.5 MG/0.1ML IZ SOSY
2.5000 mg | PREFILLED_SYRINGE | INTRAVITREAL | Status: AC | PRN
  Administered 2021-01-10: 2.5 mg via INTRAVITREAL

## 2021-01-10 NOTE — Progress Notes (Signed)
01/10/2021     CHIEF COMPLAINT Patient presents for  Chief Complaint  Patient presents with   Retina Follow Up      HISTORY OF PRESENT ILLNESS: Oscar Greene is a 70 y.o. male who presents to the clinic today for:   HPI     Retina Follow Up   Patient presents with  CRVO/BRVO.  In left eye.  This started 7 weeks ago.  Duration of 7 weeks.  Since onset it is stable.        Comments   EyeMeds: Brimonidine BID OU Cosopt BID OU Latanoprost QHS OU      Last edited by Reather Littler, COA on 01/10/2021 10:34 AM.      Referring physician: [name], MD [address] [place],  [place] [address]  HISTORICAL INFORMATION:   Selected notes from the MEDICAL RECORD NUMBER    Lab Results  Component Value Date   HGBA1C 5.0 11/18/2013     CURRENT MEDICATIONS: Current Outpatient Medications (Ophthalmic Drugs)  Medication Sig   brimonidine (ALPHAGAN) 0.2 % ophthalmic solution 1 drop 2 (two) times daily.   dorzolamide-timolol (COSOPT) 22.3-6.8 MG/ML ophthalmic solution 1 DROP IN BOTH EYES TWICE A DAY   latanoprost (XALATAN) 0.005 % ophthalmic solution Place 1 drop into both eyes daily.   No current facility-administered medications for this visit. (Ophthalmic Drugs)   Current Outpatient Medications (Other)  Medication Sig   acetaminophen (TYLENOL) 500 MG tablet Take 1 tablet (500 mg total) by mouth every 6 (six) hours as needed.   cloNIDine (CATAPRES) 0.1 MG tablet TAKE 1 TABLET BY MOUTH TWICE A DAY   clopidogrel (PLAVIX) 75 MG tablet Take 1 tablet (75 mg total) by mouth daily.   rosuvastatin (CRESTOR) 20 MG tablet Take 1 tablet (20 mg total) by mouth daily.   No current facility-administered medications for this visit. (Other)      REVIEW OF SYSTEMS:    ALLERGIES No Known Allergies  PAST MEDICAL HISTORY Past Medical History:  Diagnosis Date   AKI (acute kidney injury) ([place]) 06/2015   Cataract    CVA (cerebral vascular accident) ([place])     Glaucoma    Headache(784.0)    History of traumatic head injury 03/14/1983   A pitching wedge hit him in left frontal area   Hypertension    Osteoporosis    Prostate cancer ([place])    Dr. Pilar Jarvis, Gleason 7   Thrombocytopenia [facility])    Past Surgical History:  Procedure Laterality Date   CATARACT EXTRACTION W/PHACO Bilateral 2013   Dr. Katy Fitch   L frontal craniotomy to remove blood clot      FAMILY HISTORY Family History  Problem Relation Age of Onset   Cancer Mother    Hypertension Father     SOCIAL HISTORY Social History   Tobacco Use   Smoking status: Every Day    Packs/day: 1.00    Years: 45.00    Pack years: 45.00    Types: Cigarettes   Smokeless tobacco: Never  Vaping Use   Vaping Use: Never used  Substance Use Topics   Alcohol use: Yes    Comment: occasional   Drug use: No         OPHTHALMIC EXAM:  Base Eye Exam     Visual Acuity (ETDRS)       Right Left   Dist [place] 20/40 -1 20/40 +2   Dist ph [place] 20/25 20/25 -2  Tonometry (Tonopen, 10:40 AM)       Right Left   Pressure 7 10         Pupils       Dark React APD   Right 4 Minimal None   Left 5 Minimal None         Visual Fields       Left Right   Restrictions Total superior temporal, inferior temporal deficiencies Total inferior nasal deficiency         Extraocular Movement       Right Left    Full, Ortho Full, Ortho         Neuro/Psych     Oriented x3: Yes   Mood/Affect: Normal         Dilation     Left eye: 1.0% Mydriacyl, 2.5% Phenylephrine @ 10:40 AM           Slit Lamp and Fundus Exam     External Exam       Right Left   External Normal Normal         Slit Lamp Exam       Right Left   Lids/Lashes Normal Normal   Conjunctiva/Sclera White and quiet White and quiet   Cornea Clear Clear   Anterior Chamber Deep and quiet Deep and quiet   Iris Round and reactive Round and reactive   Lens Posterior chamber intraocular lens Posterior chamber  intraocular lens   Anterior Vitreous Normal Normal         Fundus Exam       Right Left   Posterior Vitreous  Posterior vitreous detachment, Central vitreous floaters   Disc  Collaterals on the nerve, no nvd   C/D Ratio  0.8   Macula  Microaneurysms, Hemorrhage, Cystoid macular edema   Vessels  Old central retinal vein occlusion   Periphery  Normal, no neovascularization            IMAGING AND PROCEDURES  Imaging and Procedures for 01/10/21  OCT, Retina - OU - Both Eyes       Right Eye Quality was good. Scan locations included subfoveal. Central Foveal Thickness: 243. Progression has been stable. Findings include normal foveal contour.   Left Eye Quality was good. Scan locations included subfoveal. Central Foveal Thickness: 237. Progression has improved. Findings include abnormal foveal contour, cystoid macular edema.   Notes Recurrent CME, at 7 weeks from [facility] central retinal vein occlusion.  Stable over time.     Intravitreal Injection, Pharmacologic Agent - OS - Left Eye       Time Out 01/10/2021. 11:26 AM. Confirmed correct patient, procedure, site, and patient consented.   Anesthesia Topical anesthesia was used. Anesthetic medications included Lidocaine 4%.   Procedure Preparation included 5% betadine to ocular surface, 10% betadine to eyelids, Ofloxacin . A 30 gauge needle was used.   Injection: 2.5 mg bevacizumab 2.5 MG/0.1ML   Route: Intravitreal, Site: Left Eye   NDC: [phone], Lot: [id number]   Post-op Post injection exam found visual acuity of at least counting fingers. The patient tolerated the procedure well. There were no complications. The patient received written and verbal post procedure care education. Post injection medications included ocuflox.              ASSESSMENT/PLAN:  Branch retinal vein occlusion with macular edema of left eye OS improved overall less active CME currently at 7-week follow-up interval.  We will repeat  injection Avastin OS today and examination next in 7  or 8 weeks during the holiday season      ICD-10-CM   1. 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  Stable macular condition, at 7-week interval, center involved CME but vastly improved and stabilized.  Repeat injection today to maintain follow-up in 7 to 8 weeks during this holiday season  2.  3.  Ophthalmic Meds Ordered this visit:  Meds ordered this encounter  Medications   bevacizumab (AVASTIN) SOSY 2.5 mg       Return in about 7 weeks (around 02/28/2021) for RV 7 or 8 weeks, dilate, OS, AVASTIN OCT.  There are no Patient Instructions on file for this visit.   Explained the diagnoses, plan, and follow up with the patient and they expressed understanding.  Patient expressed understanding of the importance of proper follow up care.   Clent Demark Braycen Burandt M.D. Diseases & Surgery of the Retina and Vitreous Retina & Diabetic [place] 01/10/21     Abbreviations: M myopia (nearsighted); A astigmatism; H hyperopia (farsighted); P presbyopia; Mrx spectacle prescription;  CTL contact lenses; OD right eye; OS left eye; OU both eyes  XT exotropia; ET esotropia; PEK punctate epithelial keratitis; PEE punctate epithelial erosions; DES dry eye syndrome; MGD meibomian gland dysfunction; ATs artificial tears; PFAT's preservative free artificial tears; [place] nuclear sclerotic cataract; PSC posterior subcapsular cataract; ERM epi-retinal membrane; PVD posterior vitreous detachment; RD retinal detachment; DM diabetes mellitus; DR diabetic retinopathy; NPDR non-proliferative diabetic retinopathy; PDR proliferative diabetic retinopathy; CSME clinically significant macular edema; DME diabetic macular edema; dbh dot blot hemorrhages; CWS cotton wool spot; POAG primary open angle  glaucoma; C/D cup-to-disc ratio; HVF humphrey visual field; GVF goldmann visual field; OCT optical coherence tomography; IOP intraocular pressure; BRVO Branch retinal vein occlusion; CRVO central retinal vein occlusion; CRAO central retinal artery occlusion; BRAO branch retinal artery occlusion; RT retinal tear; SB scleral buckle; PPV pars plana vitrectomy; VH Vitreous hemorrhage; PRP panretinal laser photocoagulation; IVK intravitreal kenalog; VMT vitreomacular traction; MH Macular hole;  NVD neovascularization of the disc; NVE neovascularization elsewhere; AREDS age related eye disease study; ARMD age related macular degeneration; POAG primary open angle glaucoma; EBMD epithelial/anterior basement membrane dystrophy; ACIOL anterior chamber intraocular lens; IOL intraocular lens; PCIOL posterior chamber intraocular lens; Phaco/IOL phacoemulsification with intraocular lens placement; [place] photorefractive keratectomy; LASIK laser assisted in situ keratomileusis; HTN hypertension; DM diabetes mellitus; COPD chronic obstructive pulmonary disease

## 2021-01-10 NOTE — Progress Notes (Signed)
   Covid-19 Vaccination Clinic  Name:  Oscar Greene    MRN: [id number] DOB: Mar 22, 1950  01/10/2021  [name] was observed post Covid-19 immunization for 15 minutes without incident. He was provided with Vaccine Information Sheet and instruction to access the V-Safe system.   [name] was instructed to call 911 with any severe reactions post vaccine: Difficulty breathing  Swelling of face and throat  A fast heartbeat  A bad rash all over body  Dizziness and weakness   Immunizations Administered     Name Date Dose VIS Date Route   Moderna Covid-19 vaccine Bivalent Booster 01/10/2021 12:36 PM 0.5 mL 10/23/2020 Intramuscular   Manufacturer: Moderna   Lot: [id number]   [place]: [id number]

## 2021-01-10 NOTE — Assessment & Plan Note (Signed)
OS improved overall less active CME currently at 7-week follow-up interval.  We will repeat injection Avastin OS today and examination next in 7 or 8 weeks during the holiday season

## 2021-02-28 ENCOUNTER — Other Ambulatory Visit: Payer: Self-pay

## 2021-02-28 ENCOUNTER — Encounter (INDEPENDENT_AMBULATORY_CARE_PROVIDER_SITE_OTHER): Payer: Self-pay | Admitting: Ophthalmology

## 2021-02-28 ENCOUNTER — Ambulatory Visit (INDEPENDENT_AMBULATORY_CARE_PROVIDER_SITE_OTHER): Payer: Medicare Other | Admitting: Ophthalmology

## 2021-02-28 DIAGNOSIS — H34832 Tributary (branch) retinal vein occlusion, left eye, with macular edema: Secondary | ICD-10-CM

## 2021-02-28 DIAGNOSIS — H35352 Cystoid macular degeneration, left eye: Secondary | ICD-10-CM | POA: Diagnosis not present

## 2021-02-28 MED ORDER — BEVACIZUMAB 2.5 MG/0.1ML IZ SOSY
2.5000 mg | PREFILLED_SYRINGE | INTRAVITREAL | Status: AC | PRN
  Administered 2021-02-28: 11:00:00 2.5 mg via INTRAVITREAL

## 2021-02-28 NOTE — Progress Notes (Signed)
02/28/2021     CHIEF COMPLAINT Patient presents for  Chief Complaint  Patient presents with   Retina Follow Up      HISTORY OF PRESENT ILLNESS: Oscar Greene is a 70 y.o. male who presents to the clinic today for:   HPI     Retina Follow Up           Diagnosis: CRVO/BRVO   Laterality: left eye   Onset: 7 weeks ago   Duration: 7 weeks   Course: stable         Comments   7 week fu OS oct avastin OS. Pt states "when it gets closer to time to get the injection, the left one is a little more blurry." Denies new FOL or floaters. Pt states he uses latanoprost QHS OU and dorz-tim BID OU.      Last edited by [name] on 02/28/2021 10:20 AM.      Referring physician: [name], MD [address] [facility] [address] [place],  [place] [address]  HISTORICAL INFORMATION:   Selected notes from the MEDICAL RECORD NUMBER    Lab Results  Component Value Date   HGBA1C 5.0 11/18/2013     CURRENT MEDICATIONS: Current Outpatient Medications (Ophthalmic Drugs)  Medication Sig   brimonidine (ALPHAGAN) 0.2 % ophthalmic solution 1 drop 2 (two) times daily.   dorzolamide-timolol (COSOPT) 22.3-6.8 MG/ML ophthalmic solution 1 DROP IN BOTH EYES TWICE A DAY   latanoprost (XALATAN) 0.005 % ophthalmic solution Place 1 drop into both eyes daily.   No current facility-administered medications for this visit. (Ophthalmic Drugs)   Current Outpatient Medications (Other)  Medication Sig   acetaminophen (TYLENOL) 500 MG tablet Take 1 tablet (500 mg total) by mouth every 6 (six) hours as needed.   cloNIDine (CATAPRES) 0.1 MG tablet TAKE 1 TABLET BY MOUTH TWICE A DAY   clopidogrel (PLAVIX) 75 MG tablet Take 1 tablet (75 mg total) by mouth daily.   COVID-19 mRNA bivalent vaccine, Moderna, (MODERNA COVID-19 BIVAL BOOSTER) 50 MCG/0.5ML injection Inject into the muscle.   rosuvastatin (CRESTOR) 20 MG tablet Take 1 tablet (20 mg total) by mouth daily.   No current  facility-administered medications for this visit. (Other)      REVIEW OF SYSTEMS:    ALLERGIES No Known Allergies  PAST MEDICAL HISTORY Past Medical History:  Diagnosis Date   AKI (acute kidney injury) ([place]) 06/2015   Cataract    CVA (cerebral vascular accident) ([place])    Glaucoma    Headache(784.0)    History of traumatic head injury 03/14/1983   A pitching wedge hit him in left frontal area   Hypertension    Osteoporosis    Prostate cancer ([place])    Dr. Pilar Jarvis, Gleason 7   Thrombocytopenia [facility])    Past Surgical History:  Procedure Laterality Date   CATARACT EXTRACTION W/PHACO Bilateral 2013   Dr. Katy Fitch   L frontal craniotomy to remove blood clot      FAMILY HISTORY Family History  Problem Relation Age of Onset   Cancer Mother    Hypertension Father     SOCIAL HISTORY Social History   Tobacco Use   Smoking status: Every Day    Packs/day: 1.00    Years: 45.00    Pack years: 45.00    Types: Cigarettes   Smokeless tobacco: Never  Vaping Use   Vaping Use: Never used  Substance Use Topics   Alcohol use: Yes    Comment: occasional  Drug use: No         OPHTHALMIC EXAM:  Base Eye Exam     Visual Acuity (ETDRS)       Right Left   Dist [place] 20/40 20/40 -1   Dist ph [place] 20/30 20/30 -2         Tonometry (Tonopen, 10:24 AM)       Right Left   Pressure 13 15         Pupils       Dark Light React APD   Right 5 4 Sluggish None   Left 6 5 Sluggish None         Extraocular Movement       Right Left    Full Full         Neuro/Psych     Oriented x3: Yes   Mood/Affect: Normal         Dilation     Left eye: 1.0% Mydriacyl, 2.5% Phenylephrine @ 10:24 AM           Slit Lamp and Fundus Exam     External Exam       Right Left   External Normal Normal         Slit Lamp Exam       Right Left   Lids/Lashes Normal Normal   Conjunctiva/Sclera White and quiet White and quiet   Cornea Clear Clear   Anterior Chamber Deep  and quiet Deep and quiet   Iris Round and reactive Round and reactive   Lens Posterior chamber intraocular lens Posterior chamber intraocular lens   Anterior Vitreous Normal Normal         Fundus Exam       Right Left   Posterior Vitreous  Posterior vitreous detachment, Central vitreous floaters   Disc  Collaterals on the nerve, no nvd   C/D Ratio  0.8   Macula  Microaneurysms, Hemorrhage, Cystoid macular edema   Vessels  Old central retinal vein occlusion   Periphery  Normal, no neovascularization            IMAGING AND PROCEDURES  Imaging and Procedures for 02/28/21  Intravitreal Injection, Pharmacologic Agent - OS - Left Eye       Time Out 02/28/2021. 11:12 AM. Confirmed correct patient, procedure, site, and patient consented.   Anesthesia Topical anesthesia was used. Anesthetic medications included Lidocaine 4%.   Procedure Preparation included 5% betadine to ocular surface, 10% betadine to eyelids, Ofloxacin . A 30 gauge needle was used.   Injection: 2.5 mg bevacizumab 2.5 MG/0.1ML   Route: Intravitreal, Site: Left Eye   NDC: [phone], Lot: [id number]   Post-op Post injection exam found visual acuity of at least counting fingers. The patient tolerated the procedure well. There were no complications. The patient received written and verbal post procedure care education. Post injection medications included ocuflox.      OCT, Retina - OU - Both Eyes       Right Eye Quality was good. Scan locations included subfoveal. Central Foveal Thickness: 245. Progression has been stable. Findings include normal foveal contour.   Left Eye Quality was good. Scan locations included subfoveal. Central Foveal Thickness: 237. Progression has improved. Findings include abnormal foveal contour, cystoid macular edema.   Notes Recurrent CME, at 7 weeks from [facility] central retinal vein occlusion.  Stable over time.             ASSESSMENT/PLAN:  Branch retinal vein  occlusion with macular edema of left  eye The nature of branch retinal vein occlusion with macular edema was discussed.  The patient was given access to printed information.  The treatment options including continued observation looking for spontaneous resolution versus grid laser versus intravitreal Kenalog injection were discussed.  PRIMARY THERAPY CONSISTS of Anti-VEGF Therapies, AVASTIN, LUCENTIS AND EYLEA.  Their usage was discussed to assist in halting the progression of Macular Edema, in order to preserve, protect or improve acuity.  Additionally, at times, limited focal laser therapy is used in the management.  The risks and benefits of all these options were discussed with the patient.  The patient's questions were answered.  OS with perifoveal CME persistent at 7-week interval.  We will retreat again today with Avastin to prevent CME progression into the center of the vision.     ICD-10-CM   1. Branch retinal vein occlusion with macular edema of left eye  H34.8320 Intravitreal Injection, Pharmacologic Agent - OS - Left Eye    OCT, Retina - OU - Both Eyes    bevacizumab (AVASTIN) SOSY 2.5 mg    2. Cystoid macular edema of left eye  H35.352 Intravitreal Injection, Pharmacologic Agent - OS - Left Eye    bevacizumab (AVASTIN) SOSY 2.5 mg      1.  OS, with center involved CME from BRVO without therapy in the past.  Some recurrence today of perifoveal CME at 7-week interval yet preservation of acuity.  Repeat injection Avastin today and to decrease treatment burden maintain 7-week interval of follow-up  2.  3.  Ophthalmic Meds Ordered this visit:  Meds ordered this encounter  Medications   bevacizumab (AVASTIN) SOSY 2.5 mg       Return in about 7 weeks (around 04/18/2021) for dilate, OS, AVASTIN OCT.  There are no Patient Instructions on file for this visit.   Explained the diagnoses, plan, and follow up with the patient and they expressed understanding.  Patient expressed  understanding of the importance of proper follow up care.   Clent Demark Maranda Marte M.D. Diseases & Surgery of the Retina and Vitreous Retina & Diabetic [employer] 02/28/21     Abbreviations: M myopia (nearsighted); A astigmatism; H hyperopia (farsighted); P presbyopia; Mrx spectacle prescription;  CTL contact lenses; OD right eye; OS left eye; OU both eyes  XT exotropia; ET esotropia; PEK punctate epithelial keratitis; PEE punctate epithelial erosions; DES dry eye syndrome; MGD meibomian gland dysfunction; ATs artificial tears; PFAT's preservative free artificial tears; [place] nuclear sclerotic cataract; PSC posterior subcapsular cataract; ERM epi-retinal membrane; PVD posterior vitreous detachment; RD retinal detachment; DM diabetes mellitus; DR diabetic retinopathy; NPDR non-proliferative diabetic retinopathy; PDR proliferative diabetic retinopathy; CSME clinically significant macular edema; DME diabetic macular edema; dbh dot blot hemorrhages; CWS cotton wool spot; POAG primary open angle glaucoma; C/D cup-to-disc ratio; HVF humphrey visual field; GVF goldmann visual field; OCT optical coherence tomography; IOP intraocular pressure; BRVO Branch retinal vein occlusion; CRVO central retinal vein occlusion; CRAO central retinal artery occlusion; BRAO branch retinal artery occlusion; RT retinal tear; SB scleral buckle; PPV pars plana vitrectomy; VH Vitreous hemorrhage; PRP panretinal laser photocoagulation; IVK intravitreal kenalog; VMT vitreomacular traction; MH Macular hole;  NVD neovascularization of the disc; NVE neovascularization elsewhere; AREDS age related eye disease study; ARMD age related macular degeneration; POAG primary open angle glaucoma; EBMD epithelial/anterior basement membrane dystrophy; ACIOL anterior chamber intraocular lens; IOL intraocular lens; PCIOL posterior chamber intraocular lens; Phaco/IOL phacoemulsification with intraocular lens placement; PRK photorefractive keratectomy; LASIK laser  assisted in situ keratomileusis; HTN hypertension;  DM diabetes mellitus; COPD chronic obstructive pulmonary disease °

## 2021-02-28 NOTE — Assessment & Plan Note (Signed)
The nature of branch retinal vein occlusion with macular edema was discussed.  The patient was given access to printed information.  The treatment options including continued observation looking for spontaneous resolution versus grid laser versus intravitreal Kenalog injection were discussed.  PRIMARY THERAPY CONSISTS of Anti-VEGF Therapies, AVASTIN, LUCENTIS AND EYLEA.  Their usage was discussed to assist in halting the progression of Macular Edema, in order to preserve, protect or improve acuity.  Additionally, at times, limited focal laser therapy is used in the management.  The risks and benefits of all these options were discussed with the patient.  The patient's questions were answered.  OS with perifoveal CME persistent at 7-week interval.  We will retreat again today with Avastin to prevent CME progression into the center of the vision.

## 2021-03-15 DIAGNOSIS — H348322 Tributary (branch) retinal vein occlusion, left eye, stable: Secondary | ICD-10-CM | POA: Diagnosis not present

## 2021-03-15 DIAGNOSIS — Z961 Presence of intraocular lens: Secondary | ICD-10-CM | POA: Diagnosis not present

## 2021-03-15 DIAGNOSIS — H401130 Primary open-angle glaucoma, bilateral, stage unspecified: Secondary | ICD-10-CM | POA: Diagnosis not present

## 2021-03-30 ENCOUNTER — Telehealth: Payer: Self-pay | Admitting: Family Medicine

## 2021-03-30 NOTE — Telephone Encounter (Signed)
Left message for patient's sister to call back and schedule Medicare Annual Wellness Visit (AWV) in office.   If not able to come in office, please offer to do virtually or by telephone.  Left office number and my jabber [phone].  Last AWV:01/05/2015  Please schedule at anytime with Nurse Health Advisor.

## 2021-04-07 ENCOUNTER — Other Ambulatory Visit: Payer: Self-pay | Admitting: Family Medicine

## 2021-04-08 ENCOUNTER — Ambulatory Visit (INDEPENDENT_AMBULATORY_CARE_PROVIDER_SITE_OTHER): Payer: Medicare Other

## 2021-04-08 ENCOUNTER — Other Ambulatory Visit: Payer: Self-pay

## 2021-04-08 VITALS — Ht 69.0 in | Wt 147.0 lb

## 2021-04-08 DIAGNOSIS — Z0000 Encounter for general adult medical examination without abnormal findings: Secondary | ICD-10-CM

## 2021-04-08 DIAGNOSIS — Z1211 Encounter for screening for malignant neoplasm of colon: Secondary | ICD-10-CM | POA: Diagnosis not present

## 2021-04-08 DIAGNOSIS — Z1212 Encounter for screening for malignant neoplasm of rectum: Secondary | ICD-10-CM

## 2021-04-08 NOTE — Progress Notes (Signed)
Subjective:   Oscar Greene is a 71 y.o. male who presents for an Initial Medicare Annual Wellness Visit. Virtual Visit via Telephone Note  I connected with  Oscar Greene on 04/08/21 at  9:00 AM EST by telephone and verified that I am speaking with the correct person using two identifiers.  Location: Patient: HOME Provider: [facility] Persons participating in the virtual visit: patient/Nurse Health Advisor   I discussed the limitations, risks, security and privacy concerns of performing an evaluation and management service by telephone and the availability of in person appointments. The patient expressed understanding and agreed to proceed.  Interactive audio and video telecommunications were attempted between this nurse and patient, however failed, due to patient having technical difficulties OR patient did not have access to video capability.  We continued and completed visit with audio only.  Some vital signs may be absent or patient reported.   Chriss Driver, LPN  Review of Systems     Cardiac Risk Factors include: advanced age (>12men, >50 women);hypertension;male gender;smoking/ tobacco exposure  PHONE VISIT. PT AT HOME. NURSE AT [facility].    Objective:    Today's Vitals   04/08/21 0905  Weight: 147 lb (66.7 kg)  Height: 5\' 9"  (1.753 m)   Body mass index is 21.71 kg/m.  Advanced Directives 04/08/2021 06/16/2015 11/17/2013 11/17/2013  Does Patient Have a Medical Advance Directive? No Yes No No  Type of Advance Directive - [place] - -  Does patient want to make changes to medical advance directive? - No - Patient declined - -  Copy of [place] in Chart? - No - copy requested - -  Would patient like information on creating a medical advance directive? No - Patient declined - No - patient declined information No - patient declined information    Current Medications (verified) Outpatient Encounter Medications as of 04/08/2021   Medication Sig   acetaminophen (TYLENOL) 500 MG tablet Take 1 tablet (500 mg total) by mouth every 6 (six) hours as needed.   brimonidine (ALPHAGAN) 0.2 % ophthalmic solution 1 drop 2 (two) times daily.   cloNIDine (CATAPRES) 0.1 MG tablet TAKE 1 TABLET BY MOUTH TWICE A DAY   clopidogrel (PLAVIX) 75 MG tablet TAKE 1 TABLET BY MOUTH EVERY DAY   COVID-19 mRNA bivalent vaccine, Moderna, (MODERNA COVID-19 BIVAL BOOSTER) 50 MCG/0.5ML injection Inject into the muscle.   dorzolamide-timolol (COSOPT) 22.3-6.8 MG/ML ophthalmic solution 1 DROP IN BOTH EYES TWICE A DAY   latanoprost (XALATAN) 0.005 % ophthalmic solution Place 1 drop into both eyes daily.   rosuvastatin (CRESTOR) 20 MG tablet Take 1 tablet (20 mg total) by mouth daily.   [DISCONTINUED] clopidogrel (PLAVIX) 75 MG tablet Take 1 tablet (75 mg total) by mouth daily.   No facility-administered encounter medications on file as of 04/08/2021.    Allergies (verified) Patient has no known allergies.   History: Past Medical History:  Diagnosis Date   AKI (acute kidney injury) ([place]) 06/2015   Cataract    CVA (cerebral vascular accident) ([place])    Glaucoma    Headache(784.0)    History of traumatic head injury 03/14/1983   A pitching wedge hit him in left frontal area   Hypertension    Osteoporosis    Prostate cancer ([place])    Dr. Pilar Jarvis, Gleason 7   Thrombocytopenia [facility])    Past Surgical History:  Procedure Laterality Date   CATARACT EXTRACTION W/PHACO Bilateral 2013   Dr. Katy Fitch  L frontal craniotomy to remove blood clot     Family History  Problem Relation Age of Onset   Cancer Mother    Hypertension Father    Social History   Socioeconomic History   Marital status: Single    Spouse name: Not on file   Number of children: Not on file   Years of education: Not on file   Highest education level: Not on file  Occupational History   Not on file  Tobacco Use   Smoking status: Every Day    Packs/day: 1.00    Years: 45.00     Pack years: 45.00    Types: Cigarettes   Smokeless tobacco: Never  Vaping Use   Vaping Use: Never used  Substance and Sexual Activity   Alcohol use: Yes    Comment: occasional   Drug use: No   Sexual activity: Yes  Other Topics Concern   Not on file  Social History Narrative   Patient states that he lives alone.   States that his sister lives very close to him  (distance he describes is approx distance of one city block) distance from him.   States that he also has a brother who lives very close to him--says that he lives about the same distance from him as the sister does.   He was smoking about one pack a day till he stopped at the time of his hospitalization 11/2013.   Drug screen was positive for cocaine at admission to hospital 11/2013. Patient reports at office visit 11/2013 he does not use cocaine on a routine basis and that he was " at a party 2 weeks ago"   Social Determinants of Health   Financial Resource Strain: Low Risk    Difficulty of Paying Living Expenses: Not hard at all  Food Insecurity: No Food Insecurity   Worried About Charity fundraiser in the Last Year: Never true   Arboriculturist in the Last Year: Never true  Transportation Needs: No Transportation Needs   Lack of Transportation (Medical): No   Lack of Transportation (Non-Medical): No  Physical Activity: Sufficiently Active   Days of Exercise per Week: 5 days   Minutes of Exercise per Session: 30 min  Stress: No Stress Concern Present   Feeling of Stress : Not at all  Social Connections: Moderately Integrated   Frequency of Communication with Friends and Family: More than three times a week   Frequency of Social Gatherings with Friends and Family: More than three times a week   Attends Religious Services: 1 to 4 times per year   Active Member of [employer] or Organizations: Yes   Attends Archivist Meetings: 1 to 4 times per year   Marital Status: Never married    Tobacco Counseling Ready  to quit: Not Answered Counseling given: Not Answered   Clinical Intake:  Pre-visit preparation completed: Yes  Pain : No/denies pain     BMI - recorded: 21.71 Nutritional Status: BMI of 19-24  Normal Nutritional Risks: None Diabetes: No  How often do you need to have someone help you when you read instructions, pamphlets, or other written materials from your doctor or pharmacy?: 1 - Never  Diabetic?NO  Interpreter Needed?: No  Information entered by :: mj Breauna Mazzeo, lpn   Activities of Daily Living In your present state of health, do you have any difficulty performing the following activities: 04/08/2021  Hearing? N  Vision? N  Difficulty concentrating or making decisions?  N  Walking or climbing stairs? N  Dressing or bathing? N  Doing errands, shopping? N  Preparing Food and eating ? N  Using the Toilet? N  In the past six months, have you accidently leaked urine? N  Do you have problems with loss of bowel control? N  Managing your Medications? N  Managing your Finances? N  Housekeeping or managing your Housekeeping? N  Some recent data might be hidden    Patient Care Team: [name], MD as PCP - General (Family Medicine)  Indicate any recent Medical Services you may have received from other than [facility] providers in the past year (date may be approximate).     Assessment:   This is a routine wellness examination for [employer].  Hearing/Vision screen Hearing Screening - Comments:: No hearing issues.  Vision Screening - Comments:: Glasses. Dr. Katy Fitch. 03/2021.  Dietary issues and exercise activities discussed: Current Exercise Habits: The patient has a physically strenuous job, but has no regular exercise apart from work., Exercise limited by: cardiac condition(s)   Goals Addressed             This Visit's Progress    Exercise 3x per week (30 min per time)       Continue to work and move.        Depression Screen PHQ 2/9 Scores 04/08/2021  09/05/2018 05/21/2017  PHQ - 2 Score 0 0 0    Fall Risk Fall Risk  04/08/2021 09/05/2018 05/21/2017  Falls in the past year? 0 0 No  Number falls in past yr: 0 - -  Injury with Fall? 0 - -  Risk for fall due to : Impaired balance/gait;Impaired mobility - -  Follow up Falls prevention discussed Falls evaluation completed -    FALL RISK PREVENTION PERTAINING TO THE HOME:  Any stairs in or around the home? No  If so, are there any without handrails? No  Home free of loose throw rugs in walkways, pet beds, electrical cords, etc? Yes  Adequate lighting in your home to reduce risk of falls? Yes   ASSISTIVE DEVICES UTILIZED TO PREVENT FALLS:  Life alert? No  Use of a cane, walker or w/c? No  Grab bars in the bathroom? Yes  Shower chair or bench in shower? Yes  Elevated toilet seat or a handicapped toilet? Yes   TIMED UP AND GO:  Was the test performed? No .  Phone visit.  Cognitive Function:     6CIT Screen 04/08/2021  What Year? 0 points  What month? 0 points  What time? 0 points  Count back from 20 0 points  Months in reverse 4 points  Repeat phrase 4 points  Total Score 8    Immunizations Immunization History  Administered Date(s) Administered   Fluad Quad(high Dose 65+) 01/07/2019   Moderna Covid-19 Vaccine Bivalent Booster 30yrs & up 01/10/2021   Moderna Sars-Covid-2 Vaccination 07/01/2019   PFIZER(Purple Top)SARS-COV-2 Vaccination 02/28/2020   Pneumococcal Conjugate-13 05/21/2017   Pneumococcal Polysaccharide-23 01/05/2015    TDAP status: Due, Education has been provided regarding the importance of this vaccine. Advised may receive this vaccine at local pharmacy or Health Dept. Aware to provide a copy of the vaccination record if obtained from local pharmacy or Health Dept. Verbalized acceptance and understanding.  Flu Vaccine status: Up to date  Pneumococcal vaccine status: Up to date  Covid-19 vaccine status: Completed vaccines  Qualifies for Shingles  Vaccine? Yes   Zostavax completed No   Shingrix Completed?: No.  Education has been provided regarding the importance of this vaccine. Patient has been advised to call insurance company to determine out of pocket expense if they have not yet received this vaccine. Advised may also receive vaccine at local pharmacy or Health Dept. Verbalized acceptance and understanding.  Screening Tests Health Maintenance  Topic Date Due   TETANUS/TDAP  Never done   Zoster Vaccines- Shingrix (1 of 2) Never done   COLONOSCOPY (Pts 45-10yrs Insurance coverage will need to be confirmed)  Never done   INFLUENZA VACCINE  10/11/2020   COVID-19 Vaccine (3 - Mixed Product risk series) 01/10/2021   Pneumonia Vaccine 19+ Years old  Completed   Hepatitis C Screening  Completed   HPV VACCINES  Aged Out    Health Maintenance  Health Maintenance Due  Topic Date Due   TETANUS/TDAP  Never done   Zoster Vaccines- Shingrix (1 of 2) Never done   COLONOSCOPY (Pts 45-44yrs Insurance coverage will need to be confirmed)  Never done   INFLUENZA VACCINE  10/11/2020   COVID-19 Vaccine (3 - Mixed Product risk series) 01/10/2021    Colorectal cancer screening: Referral to GI placed 04/08/2021 FOR COLOGUARD. Pt aware the office will call re: appt.  Lung Cancer Screening: (Low Dose CT Chest recommended if Age 78-80 years, 30 pack-year currently smoking OR have quit w/in 15years.) does not qualify.    Additional Screening:  Hepatitis C Screening: does qualify; Completed 05/21/2017  Vision Screening: Recommended annual ophthalmology exams for early detection of glaucoma and other disorders of the eye. Is the patient up to date with their annual eye exam?  Yes  Who is the provider or what is the name of the office in which the patient attends annual eye exams? Dr. Katy Fitch If pt is not established with a provider, would they like to be referred to a provider to establish care? No .   Dental Screening: Recommended annual dental  exams for proper oral hygiene  Community Resource Referral / Chronic Care Management: CRR required this visit?  No   CCM required this visit?  No      Plan:     I have personally reviewed and noted the following in the patients chart:   Medical and social history Use of alcohol, tobacco or illicit drugs  Current medications and supplements including opioid prescriptions. Patient is not currently taking opioid prescriptions. Functional ability and status Nutritional status Physical activity Advanced directives List of other physicians Hospitalizations, surgeries, and ER visits in previous 12 months Vitals Screenings to include cognitive, depression, and falls Referrals and appointments  In addition, I have reviewed and discussed with patient certain preventive protocols, quality metrics, and best practice recommendations. A written personalized care plan for preventive services as well as general preventive health recommendations were provided to patient.     Chriss Driver, LPN   5/40/9811   Nurse Notes: Pt agreeable to have Cologuard done. Order placed. Discussed shingrix and how to obtain.

## 2021-04-08 NOTE — Patient Instructions (Signed)
Oscar Greene , Thank you for taking time to come for your Medicare Wellness Visit. I appreciate your ongoing commitment to your health goals. Please review the following plan we discussed and let me know if I can assist you in the future.   Screening recommendations/referrals: Colonoscopy: Cologuard order placed today.  Recommended yearly ophthalmology/optometry visit for glaucoma screening and checkup Recommended yearly dental visit for hygiene and checkup  Vaccinations: Influenza vaccine: Done 12/2020 Repeat annually  Pneumococcal vaccine: Done 01/05/2015 and 05/21/2017 Tdap vaccine: Due. Repeat in 10 years  Shingles vaccine: Shingrix discussed. Please contact your pharmacy for coverage information.     Covid-19: Done 07/01/2019, 02/25/2020 and 01/10/2021.  Advanced directives: Advance directive discussed with you today. I have provided a copy for you to complete at home and have notarized. Once this is complete please bring a copy in to our office so we can scan it into your chart.   Conditions/risks identified: Aim for 30 minutes of exercise or brisk walking each day, drink 6-8 glasses of water and eat lots of fruits and vegetables.   Next appointment: Follow up in one year for your annual wellness visit. 2024.  Preventive Care 3 Years and Older, Male  Preventive care refers to lifestyle choices and visits with your health care provider that can promote health and wellness. What does preventive care include? A yearly physical exam. This is also called an annual well check. Dental exams once or twice a year. Routine eye exams. Ask your health care provider how often you should have your eyes checked. Personal lifestyle choices, including: Daily care of your teeth and gums. Regular physical activity. Eating a healthy diet. Avoiding tobacco and drug use. Limiting alcohol use. Practicing safe sex. Taking low doses of aspirin every day. Taking vitamin and mineral supplements as  recommended by your health care provider. What happens during an annual well check? The services and screenings done by your health care provider during your annual well check will depend on your age, overall health, lifestyle risk factors, and family history of disease. Counseling  Your health care provider may ask you questions about your: Alcohol use. Tobacco use. Drug use. Emotional well-being. Home and relationship well-being. Sexual activity. Eating habits. History of falls. Memory and ability to understand (cognition). Work and work Statistician. Screening  You may have the following tests or measurements: Height, weight, and BMI. Blood pressure. Lipid and cholesterol levels. These may be checked every 5 years, or more frequently if you are over 87 years old. Skin check. Lung cancer screening. You may have this screening every year starting at age 39 if you have a 30-pack-year history of smoking and currently smoke or have quit within the past 15 years. Fecal occult blood test (FOBT) of the stool. You may have this test every year starting at age 45. Flexible sigmoidoscopy or colonoscopy. You may have a sigmoidoscopy every 5 years or a colonoscopy every 10 years starting at age 30. Prostate cancer screening. Recommendations will vary depending on your family history and other risks. Hepatitis C blood test. Hepatitis B blood test. Sexually transmitted disease (STD) testing. Diabetes screening. This is done by checking your blood sugar (glucose) after you have not eaten for a while (fasting). You may have this done every 1-3 years. Abdominal aortic aneurysm (AAA) screening. You may need this if you are a current or former smoker. Osteoporosis. You may be screened starting at age 69 if you are at high risk. Talk with your health care provider  about your test results, treatment options, and if necessary, the need for more tests. Vaccines  Your health care provider may recommend  certain vaccines, such as: Influenza vaccine. This is recommended every year. Tetanus, diphtheria, and acellular pertussis (Tdap, Td) vaccine. You may need a Td booster every 10 years. Zoster vaccine. You may need this after age 11. Pneumococcal 13-valent conjugate (PCV13) vaccine. One dose is recommended after age 5. Pneumococcal polysaccharide (PPSV23) vaccine. One dose is recommended after age 18. Talk to your health care provider about which screenings and vaccines you need and how often you need them. This information is not intended to replace advice given to you by your health care provider. Make sure you discuss any questions you have with your health care provider. Document Released: 03/26/2015 Document Revised: 11/17/2015 Document Reviewed: 12/29/2014 Elsevier Interactive Patient Education  2017 [place] Prevention in the Home Falls can cause injuries. They can happen to people of all ages. There are many things you can do to make your home safe and to help prevent falls. What can I do on the outside of my home? Regularly fix the edges of walkways and driveways and fix any cracks. Remove anything that might make you trip as you walk through a door, such as a raised step or threshold. Trim any bushes or trees on the path to your home. Use bright outdoor lighting. Clear any walking paths of anything that might make someone trip, such as rocks or tools. Regularly check to see if handrails are loose or broken. Make sure that both sides of any steps have handrails. Any raised decks and porches should have guardrails on the edges. Have any leaves, snow, or ice cleared regularly. Use sand or salt on walking paths during winter. Clean up any spills in your garage right away. This includes oil or grease spills. What can I do in the bathroom? Use night lights. Install grab bars by the toilet and in the tub and shower. Do not use towel bars as grab bars. Use non-skid mats or  decals in the tub or shower. If you need to sit down in the shower, use a plastic, non-slip stool. Keep the floor dry. Clean up any water that spills on the floor as soon as it happens. Remove soap buildup in the tub or shower regularly. Attach bath mats securely with double-sided non-slip rug tape. Do not have throw rugs and other things on the floor that can make you trip. What can I do in the bedroom? Use night lights. Make sure that you have a light by your bed that is easy to reach. Do not use any sheets or blankets that are too big for your bed. They should not hang down onto the floor. Have a firm chair that has side arms. You can use this for support while you get dressed. Do not have throw rugs and other things on the floor that can make you trip. What can I do in the kitchen? Clean up any spills right away. Avoid walking on wet floors. Keep items that you use a lot in easy-to-reach places. If you need to reach something above you, use a strong step stool that has a grab bar. Keep electrical cords out of the way. Do not use floor polish or wax that makes floors slippery. If you must use wax, use non-skid floor wax. Do not have throw rugs and other things on the floor that can make you trip. What can I do  with my stairs? Do not leave any items on the stairs. Make sure that there are handrails on both sides of the stairs and use them. Fix handrails that are broken or loose. Make sure that handrails are as long as the stairways. Check any carpeting to make sure that it is firmly attached to the stairs. Fix any carpet that is loose or worn. Avoid having throw rugs at the top or bottom of the stairs. If you do have throw rugs, attach them to the floor with carpet tape. Make sure that you have a light switch at the top of the stairs and the bottom of the stairs. If you do not have them, ask someone to add them for you. What else can I do to help prevent falls? Wear shoes that: Do not  have high heels. Have rubber bottoms. Are comfortable and fit you well. Are closed at the toe. Do not wear sandals. If you use a stepladder: Make sure that it is fully opened. Do not climb a closed stepladder. Make sure that both sides of the stepladder are locked into place. Ask someone to hold it for you, if possible. Clearly mark and make sure that you can see: Any grab bars or handrails. First and last steps. Where the edge of each step is. Use tools that help you move around (mobility aids) if they are needed. These include: Canes. Walkers. Scooters. Crutches. Turn on the lights when you go into a dark area. Replace any light bulbs as soon as they burn out. Set up your furniture so you have a clear path. Avoid moving your furniture around. If any of your floors are uneven, fix them. If there are any pets around you, be aware of where they are. Review your medicines with your doctor. Some medicines can make you feel dizzy. This can increase your chance of falling. Ask your doctor what other things that you can do to help prevent falls. This information is not intended to replace advice given to you by your health care provider. Make sure you discuss any questions you have with your health care provider. Document Released: 12/24/2008 Document Revised: 08/05/2015 Document Reviewed: 04/03/2014 Elsevier Interactive Patient Education  2017 [employer].

## 2021-04-18 ENCOUNTER — Ambulatory Visit (INDEPENDENT_AMBULATORY_CARE_PROVIDER_SITE_OTHER): Payer: Medicare Other | Admitting: Ophthalmology

## 2021-04-18 ENCOUNTER — Encounter (INDEPENDENT_AMBULATORY_CARE_PROVIDER_SITE_OTHER): Payer: Self-pay | Admitting: Ophthalmology

## 2021-04-18 ENCOUNTER — Other Ambulatory Visit: Payer: Self-pay

## 2021-04-18 DIAGNOSIS — H34832 Tributary (branch) retinal vein occlusion, left eye, with macular edema: Secondary | ICD-10-CM

## 2021-04-18 MED ORDER — BEVACIZUMAB 2.5 MG/0.1ML IZ SOSY
2.5000 mg | PREFILLED_SYRINGE | INTRAVITREAL | Status: AC | PRN
  Administered 2021-04-18: 2.5 mg via INTRAVITREAL

## 2021-04-18 NOTE — Progress Notes (Addendum)
04/18/2021     CHIEF COMPLAINT Patient presents for  Chief Complaint  Patient presents with   Retina Follow Up      HISTORY OF PRESENT ILLNESS: Oscar Greene is a 71 y.o. male who presents to the clinic today for:   HPI     Retina Follow Up           Diagnosis: CRVO/BRVO   Laterality: left eye   Onset: 7 weeks ago   Severity: mild   Duration: 7 weeks   Course: gradually worsening         Comments   7 weeks dilate OS Avastin OCT. Pt states "I guess it is a little worse, I can always tell when it is time for my injection." Pt uses latanoprost QAM OU, brimonidine BID  OU, and dorz-tim BID OU.      Last edited by [name] on 04/18/2021 10:22 AM.      Referring physician: Clent Jacks, MD [address] [place],   [id number]  HISTORICAL INFORMATION:   Selected notes from the MEDICAL RECORD NUMBER    Lab Results  Component Value Date   HGBA1C 5.0 11/18/2013     CURRENT MEDICATIONS: Current Outpatient Medications (Ophthalmic Drugs)  Medication Sig   brimonidine (ALPHAGAN) 0.2 % ophthalmic solution 1 drop 2 (two) times daily.   dorzolamide-timolol (COSOPT) 22.3-6.8 MG/ML ophthalmic solution 1 DROP IN BOTH EYES TWICE A DAY   latanoprost (XALATAN) 0.005 % ophthalmic solution Place 1 drop into both eyes daily.   No current facility-administered medications for this visit. (Ophthalmic Drugs)   Current Outpatient Medications (Other)  Medication Sig   acetaminophen (TYLENOL) 500 MG tablet Take 1 tablet (500 mg total) by mouth every 6 (six) hours as needed.   cloNIDine (CATAPRES) 0.1 MG tablet TAKE 1 TABLET BY MOUTH TWICE A DAY   clopidogrel (PLAVIX) 75 MG tablet TAKE 1 TABLET BY MOUTH EVERY DAY   COVID-19 mRNA bivalent vaccine, Moderna, (MODERNA COVID-19 BIVAL BOOSTER) 50 MCG/0.5ML injection Inject into the muscle.   rosuvastatin (CRESTOR) 20 MG tablet Take 1 tablet (20 mg total) by mouth daily.   No current facility-administered  medications for this visit. (Other)      REVIEW OF SYSTEMS: ROS   Negative for: Constitutional, Gastrointestinal, Neurological, Skin, Genitourinary, Musculoskeletal, HENT, Endocrine, Cardiovascular, Eyes, Respiratory, Psychiatric, Allergic/Imm, Heme/Lymph Last edited by Hurman Horn, MD on 04/18/2021 10:32 AM.       ALLERGIES No Known Allergies  PAST MEDICAL HISTORY Past Medical History:  Diagnosis Date   AKI (acute kidney injury) ([place]) 06/2015   Cataract    CVA (cerebral vascular accident) ([place])    Glaucoma    Headache(784.0)    History of traumatic head injury 03/14/1983   A pitching wedge hit him in left frontal area   Hypertension    Osteoporosis    Prostate cancer ([place])    Dr. Pilar Jarvis, Gleason 7   Thrombocytopenia [facility])    Past Surgical History:  Procedure Laterality Date   CATARACT EXTRACTION W/PHACO Bilateral 2013   Dr. Katy Fitch   L frontal craniotomy to remove blood clot      FAMILY HISTORY Family History  Problem Relation Age of Onset   Cancer Mother    Hypertension Father     SOCIAL HISTORY Social History   Tobacco Use   Smoking status: Every Day    Packs/day: 1.00    Years: 45.00    Pack years: 45.00  Types: Cigarettes   Smokeless tobacco: Never  Vaping Use   Vaping Use: Never used  Substance Use Topics   Alcohol use: Yes    Comment: occasional   Drug use: No         OPHTHALMIC EXAM:  Base Eye Exam     Visual Acuity (ETDRS)       Right Left   Dist [place] 20/30 -1 20/40 -2   Dist ph [place]  20/30 -1         Tonometry (Tonopen, 10:25 AM)       Right Left   Pressure 8 12         Pupils       Pupils Dark Light APD   Right PERRL 5 4 None   Left PERRL 6 5 None         Extraocular Movement       Right Left    Full Full         Neuro/Psych     Oriented x3: Yes   Mood/Affect: Normal         Dilation     Left eye: 1.0% Mydriacyl, 2.5% Phenylephrine @ 10:25 AM           Slit Greene and Fundus Exam      External Exam       Right Left   External Normal Normal         Slit Greene Exam       Right Left   Lids/Lashes Normal Normal   Conjunctiva/Sclera White and quiet White and quiet   Cornea Clear Clear   Anterior Chamber Deep and quiet Deep and quiet   Iris Round and reactive Round and reactive   Lens Posterior chamber intraocular lens Posterior chamber intraocular lens   Anterior Vitreous Normal Normal         Fundus Exam       Right Left   Posterior Vitreous  Posterior vitreous detachment, Central vitreous floaters   Disc  Collaterals on the nerve, no nvd   C/D Ratio  0.8   Macula  Microaneurysms, Hemorrhage, Cystoid macular edema   Vessels  Old central retinal vein occlusion   Periphery  Normal, no neovascularization            IMAGING AND PROCEDURES  Imaging and Procedures for 04/18/21  Intravitreal Injection, Pharmacologic Agent - OS - Left Eye       Time Out 04/18/2021. 10:34 AM. Confirmed correct patient, procedure, site, and patient consented.   Anesthesia Topical anesthesia was used. Anesthetic medications included Lidocaine 4%.   Procedure Preparation included 5% betadine to ocular surface, 10% betadine to eyelids, Ofloxacin . A 30 gauge needle was used.   Injection: 2.5 mg bevacizumab 2.5 MG/0.1ML   Route: Intravitreal, Site: Left Eye   NDC: [phone], Lot: [id number]   Post-op Post injection exam found visual acuity of at least counting fingers. The patient tolerated the procedure well. There were no complications. The patient received written and verbal post procedure care education. Post injection medications included ocuflox.      OCT, Retina - OU - Both Eyes       Right Eye Quality was good. Scan locations included subfoveal. Central Foveal Thickness: 242. Progression has been stable. Findings include normal foveal contour.   Left Eye Quality was good. Scan locations included subfoveal. Central Foveal Thickness: 297. Progression has  improved. Findings include abnormal foveal contour, cystoid macular edema.   Notes Recurrent  minor CME, at  7 weeks from Hemi central retinal vein occlusion.  Stable over time.             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  The patient was given access to printed information.  The treatment options including continued observation looking for spontaneous resolution versus grid laser versus intravitreal Kenalog injection were discussed.  PRIMARY THERAPY CONSISTS of Anti-VEGF Therapies, AVASTIN, LUCENTIS AND EYLEA.  Their usage was discussed to assist in halting the progression of Macular Edema, in order to preserve, protect or improve acuity.  Additionally, at times, limited focal laser therapy is used in the management.  The risks and benefits of all these options were discussed with the patient.  The patient's questions were answered.  OS, RECURRENT at 7-week interval yet with stable acuity     ICD-10-CM   1. 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  OS stable acuity, at 7-week interval, with minor recurrence of CME superonasal to the foveal avascular zone.  Patient has developed symptoms only last few days.  We will repeat injection today maintain no longer than 7-week interval to prevent vision loss  2.  3.  Ophthalmic Meds Ordered this visit:  Meds ordered this encounter  Medications   bevacizumab (AVASTIN) SOSY 2.5 mg       Return in about 7 weeks (around 06/06/2021) for dilate, OS, AVASTIN OCT.  There are no Patient Instructions on file for this visit.   Explained the diagnoses, plan, and follow up with the patient and they expressed understanding.  Patient expressed understanding of  the importance of proper follow up care.   Clent Demark Azra Abrell M.D. Diseases & Surgery of the Retina and Vitreous [facility]ina [facility]abetic [place] 04/18/21     Abbreviations: M myopia (nearsighted); A astigmatism; H hyperopia (farsighted); P presbyopia; Mrx spectacle prescription;  CTL contact lenses; OD right eye; OS left eye; OU both eyes  XT exotropia; ET esotropia; PEK punctate epithelial keratitis; PEE punctate epithelial erosions; DES dry eye syndrome; MGD meibomian gland dysfunction; ATs artificial tears; PFAT's preservative free artificial tears; [place] nuclear sclerotic cataract; PSC posterior subcapsular cataract; ERM epi-retinal membrane; PVD posterior vitreous detachment; RD retinal detachment; DM diabetes mellitus; DR diabetic retinopathy; NPDR non-proliferative diabetic retinopathy; PDR proliferative diabetic retinopathy; CSME clinically significant macular edema; DME diabetic macular edema; dbh dot blot hemorrhages; CWS cotton wool spot; POAG primary open angle glaucoma; C/D cup-to-disc ratio; HVF humphrey visual field; GVF goldmann visual field; OCT optical coherence tomography; IOP intraocular pressure; BRVO Branch retinal vein occlusion; CRVO central retinal vein occlusion; CRAO central retinal artery occlusion; BRAO branch retinal artery occlusion; RT retinal tear; SB scleral buckle; PPV pars plana vitrectomy; VH Vitreous hemorrhage; PRP panretinal laser photocoagulation; IVK intravitreal kenalog; VMT vitreomacular traction; MH Macular hole;  NVD neovascularization of the disc; NVE neovascularization elsewhere; AREDS age related eye disease study; ARMD age related macular degeneration; POAG primary open angle glaucoma; EBMD epithelial/anterior basement membrane dystrophy; ACIOL anterior chamber intraocular lens; IOL intraocular lens; PCIOL posterior chamber intraocular lens; Phaco/IOL phacoemulsification with intraocular lens placement; [place] photorefractive keratectomy; LASIK laser assisted in situ  keratomileusis; HTN hypertension; DM diabetes mellitus; COPD chronic obstructive pulmonary disease

## 2021-04-18 NOTE — Assessment & Plan Note (Signed)
The nature of branch retinal vein occlusion with macular edema was discussed.  The patient was given access to printed information.  The treatment options including continued observation looking for spontaneous resolution versus grid laser versus intravitreal Kenalog injection were discussed.  PRIMARY THERAPY CONSISTS of Anti-VEGF Therapies, AVASTIN, LUCENTIS AND EYLEA.  Their usage was discussed to assist in halting the progression of Macular Edema, in order to preserve, protect or improve acuity.  Additionally, at times, limited focal laser therapy is used in the management.  The risks and benefits of all these options were discussed with the patient.  The patient's questions were answered.  OS, RECURRENT at 7-week interval yet with stable acuity

## 2021-06-06 ENCOUNTER — Other Ambulatory Visit: Payer: Self-pay

## 2021-06-06 ENCOUNTER — Ambulatory Visit (INDEPENDENT_AMBULATORY_CARE_PROVIDER_SITE_OTHER): Payer: Medicare Other | Admitting: Ophthalmology

## 2021-06-06 ENCOUNTER — Encounter (INDEPENDENT_AMBULATORY_CARE_PROVIDER_SITE_OTHER): Payer: Self-pay | Admitting: Ophthalmology

## 2021-06-06 DIAGNOSIS — H401112 Primary open-angle glaucoma, right eye, moderate stage: Secondary | ICD-10-CM | POA: Diagnosis not present

## 2021-06-06 DIAGNOSIS — H401123 Primary open-angle glaucoma, left eye, severe stage: Secondary | ICD-10-CM | POA: Diagnosis not present

## 2021-06-06 DIAGNOSIS — H34832 Tributary (branch) retinal vein occlusion, left eye, with macular edema: Secondary | ICD-10-CM

## 2021-06-06 MED ORDER — BEVACIZUMAB 2.5 MG/0.1ML IZ SOSY
2.5000 mg | PREFILLED_SYRINGE | INTRAVITREAL | Status: AC | PRN
  Administered 2021-06-06: 2.5 mg via INTRAVITREAL

## 2021-06-06 NOTE — Progress Notes (Signed)
? ? ?06/06/2021 ? ?  ? ?CHIEF COMPLAINT ?Patient presents for  ?Chief Complaint  ?Patient presents with  ? Branch Retinal Vein Occlusion  ? ? ? ? ?HISTORY OF PRESENT ILLNESS: ?Oscar Greene is a 71 y.o. male who presents to the clinic today for:  ? ?HPI   ?7 weeks for dilate, OS AVASTIN OCT. ?Pt states, "when it gets to about 6 or 7 weeks, I can start to see spots in the corner of my eye." ?Pt states to see floaters but denies FOL. ?Pt states to take brimonidine 2x a day, latanoprost and dorz-timolol 3x a day. ? ? ?Last edited by Silvestre Moment on 06/06/2021 10:39 AM.  ?  ? ? ?Referring physician: ?[name], MD ?[address] [place],  [place] [address] ? ?HISTORICAL INFORMATION:  ? ?Selected notes from the [place] ?  ? ?Lab Results  ?Component Value Date  ? HGBA1C 5.0 11/18/2013  ?  ? ?CURRENT MEDICATIONS: ?Current Outpatient Medications (Ophthalmic Drugs)  ?Medication Sig  ? brimonidine (ALPHAGAN) 0.2 % ophthalmic solution 1 drop 2 (two) times daily.  ? dorzolamide-timolol (COSOPT) 22.3-6.8 MG/ML ophthalmic solution 1 DROP IN BOTH EYES TWICE A DAY  ? latanoprost (XALATAN) 0.005 % ophthalmic solution Place 1 drop into both eyes daily.  ? ?No current facility-administered medications for this visit. (Ophthalmic Drugs)  ? ?Current Outpatient Medications (Other)  ?Medication Sig  ? acetaminophen (TYLENOL) 500 MG tablet Take 1 tablet (500 mg total) by mouth every 6 (six) hours as needed.  ? cloNIDine (CATAPRES) 0.1 MG tablet TAKE 1 TABLET BY MOUTH TWICE A DAY  ? clopidogrel (PLAVIX) 75 MG tablet TAKE 1 TABLET BY MOUTH EVERY DAY  ? COVID-19 mRNA bivalent vaccine, Moderna, (MODERNA COVID-19 BIVAL BOOSTER) 50 MCG/0.5ML injection Inject into the muscle.  ? rosuvastatin (CRESTOR) 20 MG tablet Take 1 tablet (20 mg total) by mouth daily.  ? ?No current facility-administered medications for this visit. (Other)  ? ? ? ? ?REVIEW OF SYSTEMS: ?ROS   ?Negative for: Constitutional, Gastrointestinal, Neurological,  Skin, Genitourinary, Musculoskeletal, HENT, Endocrine, Cardiovascular, Eyes, Respiratory, Psychiatric, Allergic/Imm, Heme/Lymph ?Last edited by Silvestre Moment on 06/06/2021 10:39 AM.  ?  ? ? ? ?ALLERGIES ?No Known Allergies ? ?PAST MEDICAL HISTORY ?Past Medical History:  ?Diagnosis Date  ? AKI (acute kidney injury) ([place]) 06/2015  ? Cataract   ? CVA (cerebral vascular accident) [facility])   ? Glaucoma   ? Headache(784.0)   ? History of traumatic head injury 03/14/1983  ? A pitching wedge hit him in left frontal area  ? Hypertension   ? Osteoporosis   ? Prostate cancer ([place])   ? Dr. Pilar Jarvis, Gleason 7  ? Thrombocytopenia ([place])   ? ?Past Surgical History:  ?Procedure Laterality Date  ? CATARACT EXTRACTION W/PHACO Bilateral 2013  ? Dr. Katy Fitch  ? L frontal craniotomy to remove blood clot    ? ? ?FAMILY HISTORY ?Family History  ?Problem Relation Age of Onset  ? Cancer Mother   ? Hypertension Father   ? ? ?SOCIAL HISTORY ?Social History  ? ?Tobacco Use  ? Smoking status: Every Day  ?  Packs/day: 1.00  ?  Years: 45.00  ?  Pack years: 45.00  ?  Types: Cigarettes  ? Smokeless tobacco: Never  ?Vaping Use  ? Vaping Use: Never used  ?Substance Use Topics  ? Alcohol use: Yes  ?  Comment: occasional  ? Drug use: No  ? ?  ? ?  ? ?OPHTHALMIC  EXAM: ? ?Base Eye Exam   ? ? Visual Acuity (ETDRS)   ? ?   Right Left  ? Dist [place] 20/30 -2 20/30  ? Dist ph [place] 20/25 -2 NI  ? ?  ?  ? ? Tonometry (Tonopen, 10:45 AM)   ? ?   Right Left  ? Pressure 14 15  ? ?  ?  ? ? Pupils   ? ?   Pupils APD  ? Right PERRL None  ? Left PERRL None  ? ?  ?  ? ? Visual Fields   ? ?   Left Right  ?  Full Full  ? ?  ?  ? ? Extraocular Movement   ? ?   Right Left  ?  Full Full  ? ?  ?  ? ? Neuro/Psych   ? ? Oriented x3: Yes  ? Mood/Affect: Normal  ? ?  ?  ? ? Dilation   ? ? Left eye: 2.5% Phenylephrine, 1.0% Mydriacyl @ 10:45 AM  ? ?  ?  ? ?  ? ?Slit Lamp and Fundus Exam   ? ? External Exam   ? ?   Right Left  ? External Normal Normal  ? ?  ?  ? ? Slit Lamp Exam   ? ?   Right Left  ?  Lids/Lashes Normal Normal  ? Conjunctiva/Sclera White and quiet White and quiet  ? Cornea Clear Clear  ? Anterior Chamber Deep and quiet Deep and quiet  ? Iris Round and reactive Round and reactive  ? Lens Posterior chamber intraocular lens Posterior chamber intraocular lens  ? Anterior Vitreous Normal Normal  ? ?  ?  ? ? Fundus Exam   ? ?   Right Left  ? Posterior Vitreous  Posterior vitreous detachment, Central vitreous floaters  ? Disc  Collaterals on the nerve, no nvd  ? C/D Ratio  0.8  ? Macula  Microaneurysms, Hemorrhage, Cystoid macular edema  ? Vessels  Old central retinal vein occlusion  ? Periphery  Normal, no neovascularization  ? ?  ?  ? ?  ? ? ?IMAGING AND PROCEDURES  ?Imaging and Procedures for 06/06/21 ? ?OCT, Retina - OU - Both Eyes   ? ?   ?Right Eye ?Quality was good. Scan locations included subfoveal. Central Foveal Thickness: 243. Progression has been stable. Findings include normal foveal contour.  ? ?Left Eye ?Quality was good. Scan locations included subfoveal. Central Foveal Thickness: 243. Progression has improved. Findings include abnormal foveal contour, cystoid macular edema.  ? ?Notes ?Recurrent  minor CME, at 7 weeks from [facility] central retinal vein occlusion.  Stable over time.  And slightly improved today ? ?  ? ?Intravitreal Injection, Pharmacologic Agent - OS - Left Eye   ? ?   ?Time Out ?06/06/2021. 11:10 AM. Confirmed correct patient, procedure, site, and patient consented.  ? ?Anesthesia ?Topical anesthesia was used. Anesthetic medications included Lidocaine 4%.  ? ?Procedure ?Preparation included 5% betadine to ocular surface, 10% betadine to eyelids, Ofloxacin . A 30 gauge needle was used.  ? ?Injection: ?2.5 mg bevacizumab 2.5 MG/0.1ML ?  Route: Intravitreal, Site: Left Eye ?  [place]: 67591-638-46, Lot: [id number]  ? ?Post-op ?Post injection exam found visual acuity of at least counting fingers. The patient tolerated the procedure well. There were no complications. The patient received  written and verbal post procedure care education. Post injection medications included ocuflox.  ? ?  ? ? ?  ?  ? ?  ?  ASSESSMENT/PLAN: ? 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 ?Primary open angle glaucoma of right eye, moderate stage ?Stable overall ? ?Primary open-angle glaucoma, severe stage ?Advanced atrophy OS  ? ?  ICD-10-CM   ?1. Branch retinal vein occlusion with macular edema of left eye  H34.8320 OCT, Retina - OU - Both Eyes  ?  Intravitreal Injection, Pharmacologic Agent - OS - Left Eye  ?  bevacizumab (AVASTIN) SOSY 2.5 mg  ?  ?2. Primary open angle glaucoma of right eye, moderate stage  H40.1112   ?  ?3. Primary open angle glaucoma (POAG) of left eye, severe stage  H40.1123   ?  ? ? ?1.  OS, slight recurrence of perifoveal CME from macular BRVO.  Stabilized however with preserved acuity at 7-week interval.  Repeat injection OS today and examination again in 7 weeks to minimize burden ? ?2. ? ?3. ? ?Ophthalmic Meds Ordered this visit:  ?Meds ordered this encounter  ?Medications  ? bevacizumab (AVASTIN) SOSY 2.5 mg  ? ? ?  ? ?Return in about 7 weeks (around 07/25/2021) for dilate, OS, AVASTIN OCT. ? ?There are no Patient Instructions on file for this visit. ? ? ?Explained the diagnoses, plan, and follow up with the patient and they expressed understanding.  Patient expressed understanding of the importance of proper follow up care.  ? ?Clent Demark. Daune Divirgilio M.D. ?Diseases & Surgery of the Retina and Vitreous ?[place] ?06/06/21 ? ? ? ? ?Abbreviations: ?M myopia (nearsighted); A astigmatism; H hyperopia (farsighted); P presbyopia; Mrx spectacle prescription;  CTL contact lenses; OD right eye; OS left eye; OU both eyes  XT exotropia; ET esotropia; PEK punctate epithelial keratitis; PEE punctate epithelial erosions; DES dry eye  syndrome; MGD meibomian gland dysfunction; ATs artificial tears; PFAT's preservative free artificial tears; [place] nuclear sclerotic cataract; PSC posterior subcapsular cataract; ERM epi-retinal membrane; PV

## 2021-06-06 NOTE — Assessment & Plan Note (Signed)
Advanced atrophy OS ?

## 2021-06-06 NOTE — Assessment & Plan Note (Signed)
OS today much improved at 7 weeks post most recent injection Avastin, acuity stable, will repeat injection today and follow-up again in 7 weeks ?

## 2021-06-06 NOTE — Assessment & Plan Note (Signed)
Stable overall.  

## 2021-06-09 ENCOUNTER — Telehealth: Payer: Self-pay

## 2021-06-09 NOTE — Telephone Encounter (Signed)
Pt's sister came in to drop off advanced directive and power of attorney info for pt. PPW placed in nurse's folder. ? ? ? ? ?Cb#: [phone] ?

## 2021-07-06 NOTE — Telephone Encounter (Signed)
Can you check the files to see if this form as been fax yet?  ?

## 2021-07-06 NOTE — Telephone Encounter (Signed)
Pt's info has been scanned into pt's chart. ?

## 2021-07-14 DIAGNOSIS — H401130 Primary open-angle glaucoma, bilateral, stage unspecified: Secondary | ICD-10-CM | POA: Diagnosis not present

## 2021-07-14 DIAGNOSIS — Z961 Presence of intraocular lens: Secondary | ICD-10-CM | POA: Diagnosis not present

## 2021-07-25 ENCOUNTER — Ambulatory Visit (INDEPENDENT_AMBULATORY_CARE_PROVIDER_SITE_OTHER): Payer: Medicare Other | Admitting: Ophthalmology

## 2021-07-25 DIAGNOSIS — H401123 Primary open-angle glaucoma, left eye, severe stage: Secondary | ICD-10-CM

## 2021-07-25 DIAGNOSIS — H34832 Tributary (branch) retinal vein occlusion, left eye, with macular edema: Secondary | ICD-10-CM | POA: Diagnosis not present

## 2021-07-25 MED ORDER — BEVACIZUMAB 2.5 MG/0.1ML IZ SOSY
2.5000 mg | PREFILLED_SYRINGE | INTRAVITREAL | Status: AC | PRN
  Administered 2021-07-25: 2.5 mg via INTRAVITREAL
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
## 2021-07-25 NOTE — Assessment & Plan Note (Signed)
OS slight increase in CME at 7-week interval today from [facility].  But no center involved CME just perifoveal.  For this reason we will tolerate this and retreat again today with Avastin and maintain 7-week evaluation left eye ?

## 2021-07-25 NOTE — Assessment & Plan Note (Signed)
OS under the care of Dr. Clent Jacks, stable IOP today ?

## 2021-07-27 ENCOUNTER — Other Ambulatory Visit: Payer: Self-pay

## 2021-08-22 ENCOUNTER — Other Ambulatory Visit: Payer: Self-pay | Admitting: Family Medicine

## 2021-08-23 NOTE — Telephone Encounter (Signed)
Attempted to call patient to schedule follow up appointment- left message to call office. Courtesy #[phone]  Requested Prescriptions  Pending Prescriptions Disp Refills  . cloNIDine (CATAPRES) 0.1 MG tablet [Pharmacy Med Name: CLONIDINE HCL 0.1 MG TABLET] 180 tablet 2    Sig: TAKE 1 TABLET BY MOUTH TWICE A DAY     Cardiovascular:  Alpha-2 Agonists Failed - 08/23/2021 11:08 AM      Failed - Last BP in normal range    BP Readings from Last 1 Encounters:  10/14/20 (!) 148/82         Failed - Valid encounter within last 6 months    Recent Outpatient Visits          10 months ago Benign essential HTN   [place] [name], MD   2 years ago Benign essential HTN   [place] Dennard Schaumann, Cammie Mcgee, MD   4 years ago Encounter for hepatitis C screening test for low risk patient   [place] [name], MD   6 years ago Lung field abnormal finding on examination   [place] Pickard, Cammie Mcgee, MD   6 years ago Routine general medical examination at a health care facility   [place], Cammie Mcgee, MD             Passed - Last Heart Rate in normal range    Pulse Readings from Last 1 Encounters:  10/14/20 78         . rosuvastatin (CRESTOR) 20 MG tablet [Pharmacy Med Name: ROSUVASTATIN CALCIUM 20 MG TAB] 90 tablet 3    Sig: TAKE 1 TABLET BY MOUTH EVERY DAY     Cardiovascular:  Antilipid - Statins 2 Failed - 08/23/2021 11:08 AM      Failed - Lipid Panel in normal range within the last 12 months    Cholesterol  Date Value Ref Range Status  10/14/2020 145 <200 mg/dL Final   LDL Cholesterol (Calc)  Date Value Ref Range Status  10/14/2020 73 mg/dL (calc) Final    Comment:    Reference range: <100 . Desirable range <100 mg/dL for primary prevention;   <70 mg/dL for patients with CHD or diabetic patients  with > or = 2 CHD risk factors. Marland Kitchen LDL-C is now calculated using the  Martin-Hopkins  calculation, which is a validated novel method providing  better accuracy than the Friedewald equation in the  estimation of LDL-C.  Cresenciano Genre et al. Annamaria Helling. 2202;542(70): 2061-2068  (http://education.QuestDiagnostics.com/faq/FAQ164)    HDL  Date Value Ref Range Status  10/14/2020 49 > OR = 40 mg/dL Final   Triglycerides  Date Value Ref Range Status  10/14/2020 149 <150 mg/dL Final         Passed - Cr in normal range and within 360 days    Creat  Date Value Ref Range Status  10/14/2020 1.06 0.70 - 1.28 mg/dL Final         Passed - Patient is not pregnant      Passed - Valid encounter within last 12 months    Recent Outpatient Visits          10 months ago Benign essential HTN   [place] Pickard, Cammie Mcgee, MD   2 years ago Benign essential HTN   [place], Warren T, MD   4 years ago Encounter for hepatitis C screening test for low risk  patient   [place] Dennard Schaumann Cammie Mcgee, MD   6 years ago Lung field abnormal finding on examination   [place] Pickard, Cammie Mcgee, MD   6 years ago Routine general medical examination at a health care facility   [place], Cammie Mcgee, MD

## 2021-09-09 ENCOUNTER — Ambulatory Visit (INDEPENDENT_AMBULATORY_CARE_PROVIDER_SITE_OTHER): Payer: Medicare Other | Admitting: Family Medicine

## 2021-09-09 VITALS — BP 140/68 | HR 63 | Temp 98.2°F | Ht 69.0 in | Wt 151.0 lb

## 2021-09-09 DIAGNOSIS — I6522 Occlusion and stenosis of left carotid artery: Secondary | ICD-10-CM

## 2021-09-09 DIAGNOSIS — I639 Cerebral infarction, unspecified: Secondary | ICD-10-CM | POA: Diagnosis not present

## 2021-09-09 DIAGNOSIS — I10 Essential (primary) hypertension: Secondary | ICD-10-CM | POA: Diagnosis not present
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
## 2021-09-09 NOTE — Progress Notes (Signed)
Flushed pt's l-ear per Dr. Dennard Schaumann. As I was flushing pt's ear, pt jump slightly, asked pt if that hurt, pt stated yea a little, asked pt if he wants me to continue or stops? Pt responded,"no" so I continue to flush. Again, he jumped. I stopped, check his ear, notice that it look like it was bleeding. Advice pt, I will get Dr. Dennard Schaumann to check out his ear first before we continue.

## 2021-09-10 LAB — LIPID PANEL
Cholesterol: 83 mg/dL (ref <200)
HDL: 43 mg/dL (ref >=40)
LDL Cholesterol (Calc): 23 mg/dL (calc)
Non-HDL Cholesterol (Calc): 40 mg/dL (calc) (ref <130)
Total CHOL/HDL Ratio: 1.9 (calc) (ref <5.0)
Triglycerides: 83 mg/dL (ref <150)

## 2021-09-10 LAB — COMPLETE METABOLIC PANEL WITH GFR
AG Ratio: 1.8 (calc) (ref 1.0–2.5)
ALT: 22 U/L (ref 9–46)
AST: 25 U/L (ref 10–35)
Albumin: 4.2 g/dL (ref 3.6–5.1)
Alkaline phosphatase (APISO): 47 U/L (ref 35–144)
BUN/Creatinine Ratio: 21 (calc) (ref 6–22)
BUN: 29 mg/dL — ABNORMAL HIGH (ref 7–25)
CO2: 29 mmol/L (ref 20–32)
Calcium: 9.2 mg/dL (ref 8.6–10.3)
Chloride: 112 mmol/L — ABNORMAL HIGH (ref 98–110)
Creat: 1.37 mg/dL — ABNORMAL HIGH (ref 0.70–1.28)
Globulin: 2.4 g/dL (calc) (ref 1.9–3.7)
Glucose, Bld: 69 mg/dL (ref 65–99)
Potassium: 4.8 mmol/L (ref 3.5–5.3)
Sodium: 144 mmol/L (ref 135–146)
Total Bilirubin: 0.6 mg/dL (ref 0.2–1.2)
Total Protein: 6.6 g/dL (ref 6.1–8.1)
eGFR: 55 mL/min/{1.73_m2} — ABNORMAL LOW (ref >=60)

## 2021-09-10 LAB — CBC WITH DIFFERENTIAL/PLATELET
Absolute Monocytes: 600 cells/uL (ref 200–950)
Basophils Absolute: 102 cells/uL (ref 0–200)
Basophils Relative: 1.7 %
Eosinophils Absolute: 420 cells/uL (ref 15–500)
Eosinophils Relative: 7 %
HCT: 39 % (ref 38.5–50.0)
Hemoglobin: 13.3 g/dL (ref 13.2–17.1)
Lymphs Abs: 1602 cells/uL (ref 850–3900)
MCH: 33.8 pg — ABNORMAL HIGH (ref 27.0–33.0)
MCHC: 34.1 g/dL (ref 32.0–36.0)
MCV: 99 fL (ref 80.0–100.0)
MPV: 12.5 fL (ref 7.5–12.5)
Monocytes Relative: 10 %
Neutro Abs: 3276 cells/uL (ref 1500–7800)
Neutrophils Relative %: 54.6 %
Platelets: 127 10*3/uL — ABNORMAL LOW (ref 140–400)
RBC: 3.94 10*6/uL — ABNORMAL LOW (ref 4.20–5.80)
RDW: 11.6 % (ref 11.0–15.0)
Total Lymphocyte: 26.7 %
WBC: 6 10*3/uL (ref 3.8–10.8)

## 2021-09-14 ENCOUNTER — Encounter (INDEPENDENT_AMBULATORY_CARE_PROVIDER_SITE_OTHER): Payer: Self-pay | Admitting: Ophthalmology

## 2021-09-14 ENCOUNTER — Ambulatory Visit (INDEPENDENT_AMBULATORY_CARE_PROVIDER_SITE_OTHER): Payer: Medicare Other | Admitting: Ophthalmology

## 2021-09-14 DIAGNOSIS — H35352 Cystoid macular degeneration, left eye: Secondary | ICD-10-CM

## 2021-09-14 DIAGNOSIS — H34832 Tributary (branch) retinal vein occlusion, left eye, with macular edema: Secondary | ICD-10-CM

## 2021-09-14 MED ORDER — BEVACIZUMAB 2.5 MG/0.1ML IZ SOSY
2.5000 mg | PREFILLED_SYRINGE | INTRAVITREAL | Status: AC | PRN
  Administered 2021-09-14: 2.5 mg via INTRAVITREAL

## 2021-09-14 NOTE — Progress Notes (Addendum)
09/14/2021     CHIEF COMPLAINT Patient presents for No chief complaint on file.     HISTORY OF PRESENT ILLNESS: Oscar Greene is a 71 y.o. male who presents to the clinic today for:   HPI   No change in medical hx Last edited by Hurman Horn, MD on 09/14/2021 10:38 AM.      Referring physician: [name], MD [address] [place],  [place] [address]  HISTORICAL INFORMATION:   Selected notes from the MEDICAL RECORD NUMBER    Lab Results  Component Value Date   HGBA1C 5.0 11/18/2013     CURRENT MEDICATIONS: Current Outpatient Medications (Ophthalmic Drugs)  Medication Sig   brimonidine (ALPHAGAN) 0.2 % ophthalmic solution 1 drop 2 (two) times daily.   dorzolamide-timolol (COSOPT) 22.3-6.8 MG/ML ophthalmic solution 1 DROP IN BOTH EYES TWICE A DAY   latanoprost (XALATAN) 0.005 % ophthalmic solution Place 1 drop into both eyes daily.   No current facility-administered medications for this visit. (Ophthalmic Drugs)   Current Outpatient Medications (Other)  Medication Sig   acetaminophen (TYLENOL) 500 MG tablet Take 1 tablet (500 mg total) by mouth every 6 (six) hours as needed.   cloNIDine (CATAPRES) 0.1 MG tablet TAKE 1 TABLET BY MOUTH TWICE A DAY   clopidogrel (PLAVIX) 75 MG tablet TAKE 1 TABLET BY MOUTH EVERY DAY   COVID-19 mRNA bivalent vaccine, Moderna, (MODERNA COVID-19 BIVAL BOOSTER) 50 MCG/0.5ML injection Inject into the muscle.   rosuvastatin (CRESTOR) 20 MG tablet TAKE 1 TABLET BY MOUTH EVERY DAY   No current facility-administered medications for this visit. (Other)      REVIEW OF SYSTEMS: ROS   Negative for: Constitutional, Gastrointestinal, Neurological, Skin, Genitourinary, Musculoskeletal, HENT, Endocrine, Cardiovascular, Eyes, Respiratory, Psychiatric, Allergic/Imm, Heme/Lymph Last edited by Hurman Horn, MD on 09/14/2021 10:37 AM.       ALLERGIES No Known Allergies  PAST MEDICAL HISTORY Past Medical History:  Diagnosis Date    AKI (acute kidney injury) ([place]) 06/2015   Cataract    CVA (cerebral vascular accident) ([place])    Glaucoma    Headache(784.0)    History of traumatic head injury 03/14/1983   A pitching wedge hit him in left frontal area   Hypertension    Osteoporosis    Prostate cancer ([place])    Dr. Pilar Jarvis, Gleason 7   Thrombocytopenia [facility])    Past Surgical History:  Procedure Laterality Date   CATARACT EXTRACTION W/PHACO Bilateral 2013   Dr. Katy Fitch   L frontal craniotomy to remove blood clot      FAMILY HISTORY Family History  Problem Relation Age of Onset   Cancer Mother    Hypertension Father     SOCIAL HISTORY Social History   Tobacco Use   Smoking status: Every Day    Packs/day: 1.00    Years: 45.00    Total pack years: 45.00    Types: Cigarettes   Smokeless tobacco: Never  Vaping Use   Vaping Use: Never used  Substance Use Topics   Alcohol use: Yes    Comment: occasional   Drug use: No         OPHTHALMIC EXAM:  Base Eye Exam     Visual Acuity (ETDRS)       Right Left   Dist  20/25 20/50 -1         Pupils       Pupils APD   Right PERRL None   Left PERRL None  Visual Fields       Left Right    Full Full         Extraocular Movement       Right Left    Full, Ortho Full, Ortho         Neuro/Psych     Oriented x3: Yes   Mood/Affect: Normal         Dilation     Left eye: 1.0% Mydriacyl, 2.5% Phenylephrine @ 10:39 AM           Slit Lamp and Fundus Exam     External Exam       Right Left   External Normal Normal         Slit Lamp Exam       Right Left   Lids/Lashes Normal Normal   Conjunctiva/Sclera White and quiet White and quiet   Cornea Clear Clear   Anterior Chamber Deep and quiet Deep and quiet   Iris Round and reactive Round and reactive   Lens Posterior chamber intraocular lens Posterior chamber intraocular lens   Anterior Vitreous Normal Normal         Fundus Exam       Right Left   Posterior  Vitreous  Posterior vitreous detachment, Central vitreous floaters   Disc  Collaterals on the nerve, no nvd   C/D Ratio  0.8   Macula  Microaneurysms, Hemorrhage, Cystoid macular edema, 90 D examination   Vessels  Old central retinal vein occlusion   Periphery  no neovascularization, 20 D examination, CME            IMAGING AND PROCEDURES  Imaging and Procedures for 09/14/21  OCT, Retina - OU - Both Eyes       Right Eye Quality was good. Scan locations included subfoveal. Central Foveal Thickness: 243. Progression has been stable. Findings include normal foveal contour.   Left Eye Quality was good. Scan locations included subfoveal. Central Foveal Thickness: 426. Progression has worsened. Findings include abnormal foveal contour, cystoid macular edema.   Notes Recurrent  minor CME, at 7 weeks from [facility] central retinal vein occlusion.  Stable over time worse CME OS at this time.  Today increased CME at 7-week interval.     Intravitreal Injection, Pharmacologic Agent - OS - Left Eye       Time Out 09/14/2021. 10:41 AM. Confirmed correct patient, procedure, site, and patient consented.   Anesthesia Topical anesthesia was used. Anesthetic medications included Lidocaine 4%.   Procedure Preparation included 5% betadine to ocular surface, 10% betadine to eyelids, Ofloxacin . A 30 gauge needle was used.   Injection: 2.5 mg bevacizumab 2.5 MG/0.1ML   Route: Intravitreal, Site: Left Eye   NDC: [id number], Lot: [id number], Expiration date: 11/18/2021   Post-op Post injection exam found visual acuity of at least counting fingers. The patient tolerated the procedure well. There were no complications. The patient received written and verbal post procedure care education. Post injection medications included ocuflox.              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  We  will repeat injection today and follow-up again in 6 weeks to maintain acuity  Cystoid macular edema of left eye OS, center involved CME     ICD-10-CM   1. 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 Cystoid macular edema of left eye  H35.352       1.  Center involved CME OS.  Will need repeat injection Avastin today to regain vision as well as stabilize condition.  2.  Repeat evaluation OS next in 6 weeks  3.  Ophthalmic Meds Ordered this visit:  Meds ordered this encounter  Medications   bevacizumab (AVASTIN) SOSY 2.5 mg       Return in about 6 weeks (around 10/26/2021) for dilate, OS, AVASTIN OCT.  There are no Patient Instructions on file for this visit.   Explained the diagnoses, plan, and follow up with the patient and they expressed understanding.  Patient expressed understanding of the importance of proper follow up care.   Clent Demark Plez Belton M.D. Diseases & Surgery of the Retina and Vitreous Retina & Diabetic [place] 09/14/21     Abbreviations: M myopia (nearsighted); A astigmatism; H hyperopia (farsighted); P presbyopia; Mrx spectacle prescription;  CTL contact lenses; OD right eye; OS left eye; OU both eyes  XT exotropia; ET esotropia; PEK punctate epithelial keratitis; PEE punctate epithelial erosions; DES dry eye syndrome; MGD meibomian gland dysfunction; ATs artificial tears; PFAT's preservative free artificial tears; [place] nuclear sclerotic cataract; PSC posterior subcapsular cataract; ERM epi-retinal membrane; PVD posterior vitreous detachment; RD retinal detachment; DM diabetes mellitus; DR diabetic retinopathy; NPDR non-proliferative diabetic retinopathy; PDR proliferative diabetic retinopathy; CSME clinically significant macular edema; DME diabetic macular edema; dbh dot blot hemorrhages; CWS cotton  wool spot; POAG primary open angle glaucoma; C/D cup-to-disc ratio; HVF humphrey visual field; GVF goldmann visual field; OCT optical coherence tomography; IOP intraocular pressure; BRVO Branch retinal vein occlusion; CRVO central retinal vein occlusion; CRAO central retinal artery occlusion; BRAO branch retinal artery occlusion; RT retinal tear; SB scleral buckle; PPV pars plana vitrectomy; VH Vitreous hemorrhage; PRP panretinal laser photocoagulation; IVK intravitreal kenalog; VMT vitreomacular traction; MH Macular hole;  NVD neovascularization of the disc; NVE neovascularization elsewhere; AREDS age related eye disease study; ARMD age related macular degeneration; POAG primary open angle glaucoma; EBMD epithelial/anterior basement membrane dystrophy; ACIOL anterior chamber intraocular lens; IOL intraocular lens; PCIOL posterior chamber intraocular lens; Phaco/IOL phacoemulsification with intraocular lens placement; [place] photorefractive keratectomy; LASIK laser assisted in situ keratomileusis; HTN hypertension; DM diabetes mellitus; COPD chronic obstructive pulmonary disease

## 2021-09-14 NOTE — Assessment & Plan Note (Signed)
BRVO with CME recurrence again at extended interval of 7 weeks.  We will repeat injection today and follow-up again in 6 weeks to maintain acuity

## 2021-09-14 NOTE — Assessment & Plan Note (Signed)
OS, center involved CME

## 2021-09-22 ENCOUNTER — Telehealth: Payer: Self-pay

## 2021-09-22 NOTE — Telephone Encounter (Signed)
[facility] called in about this pt prescription for a knee hinge. CA states that they will need a diagnosis code specifying left or right knee for this prescription to be filled. Please advise.  Prescription can be faxed to [phone] Attn: Z  Please call if any questions: [phone]

## 2021-09-23 NOTE — Telephone Encounter (Signed)
Rx put up front desk to be fax

## 2021-10-02 IMAGING — DX DG KNEE 1-2V*L*
2 series · 2 of 2 positions shown · non-contrast
Comparison: None.

CLINICAL DATA: Left knee pain

EXAM:
LEFT KNEE - 1-2 VIEW

[knee ap]
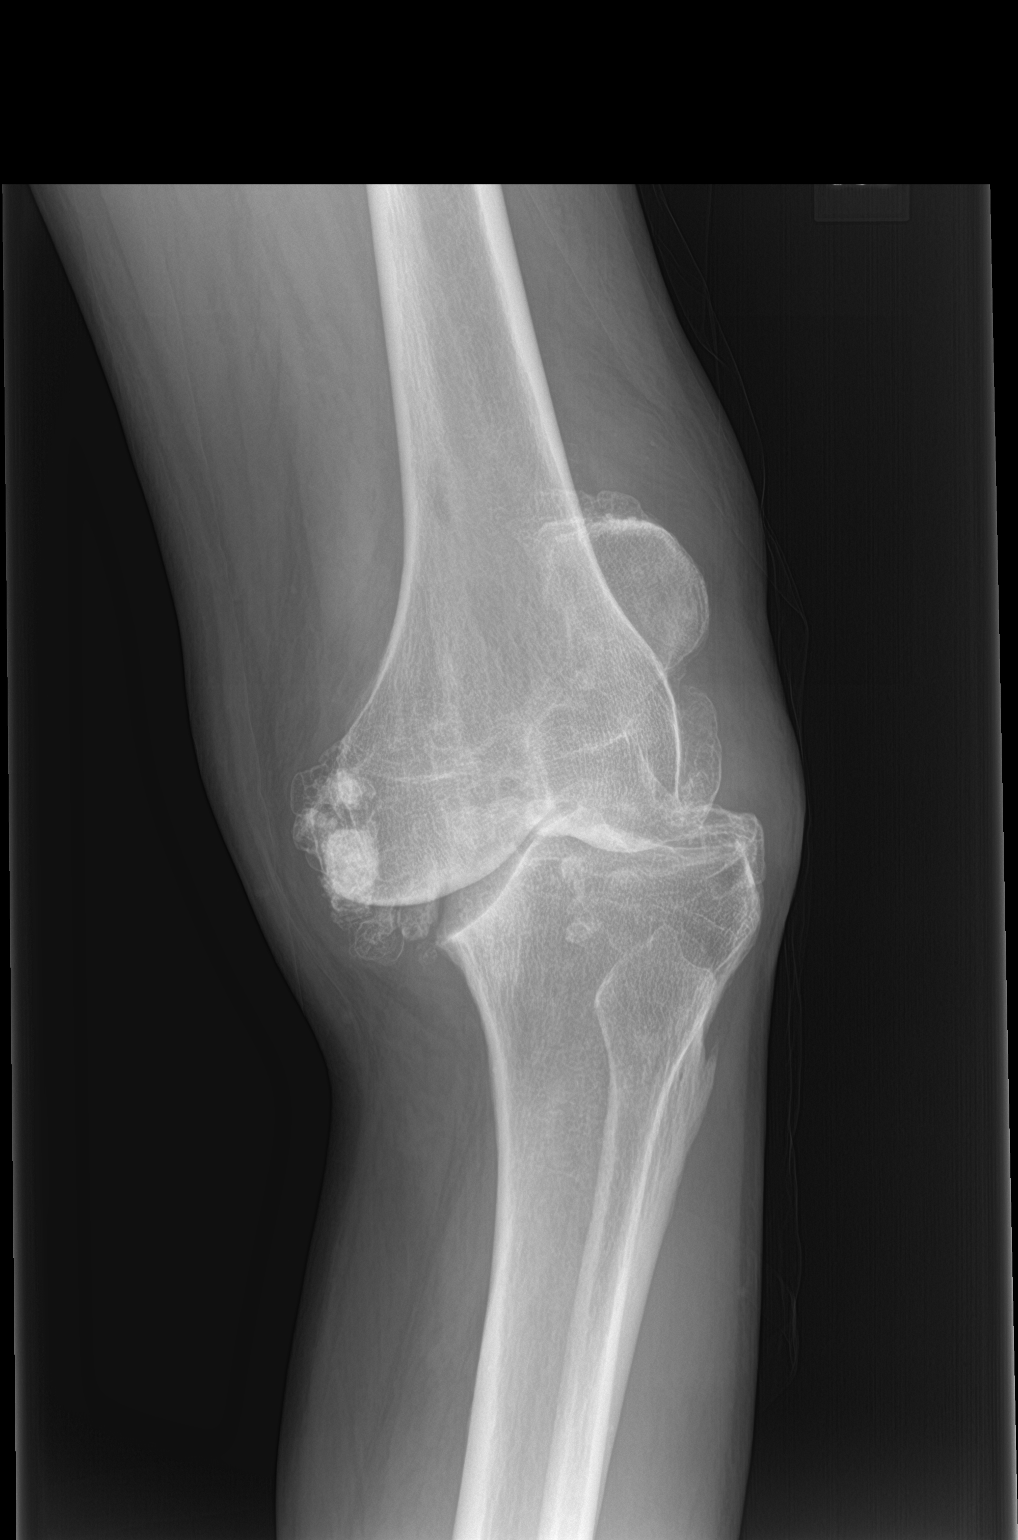

[knee lat]
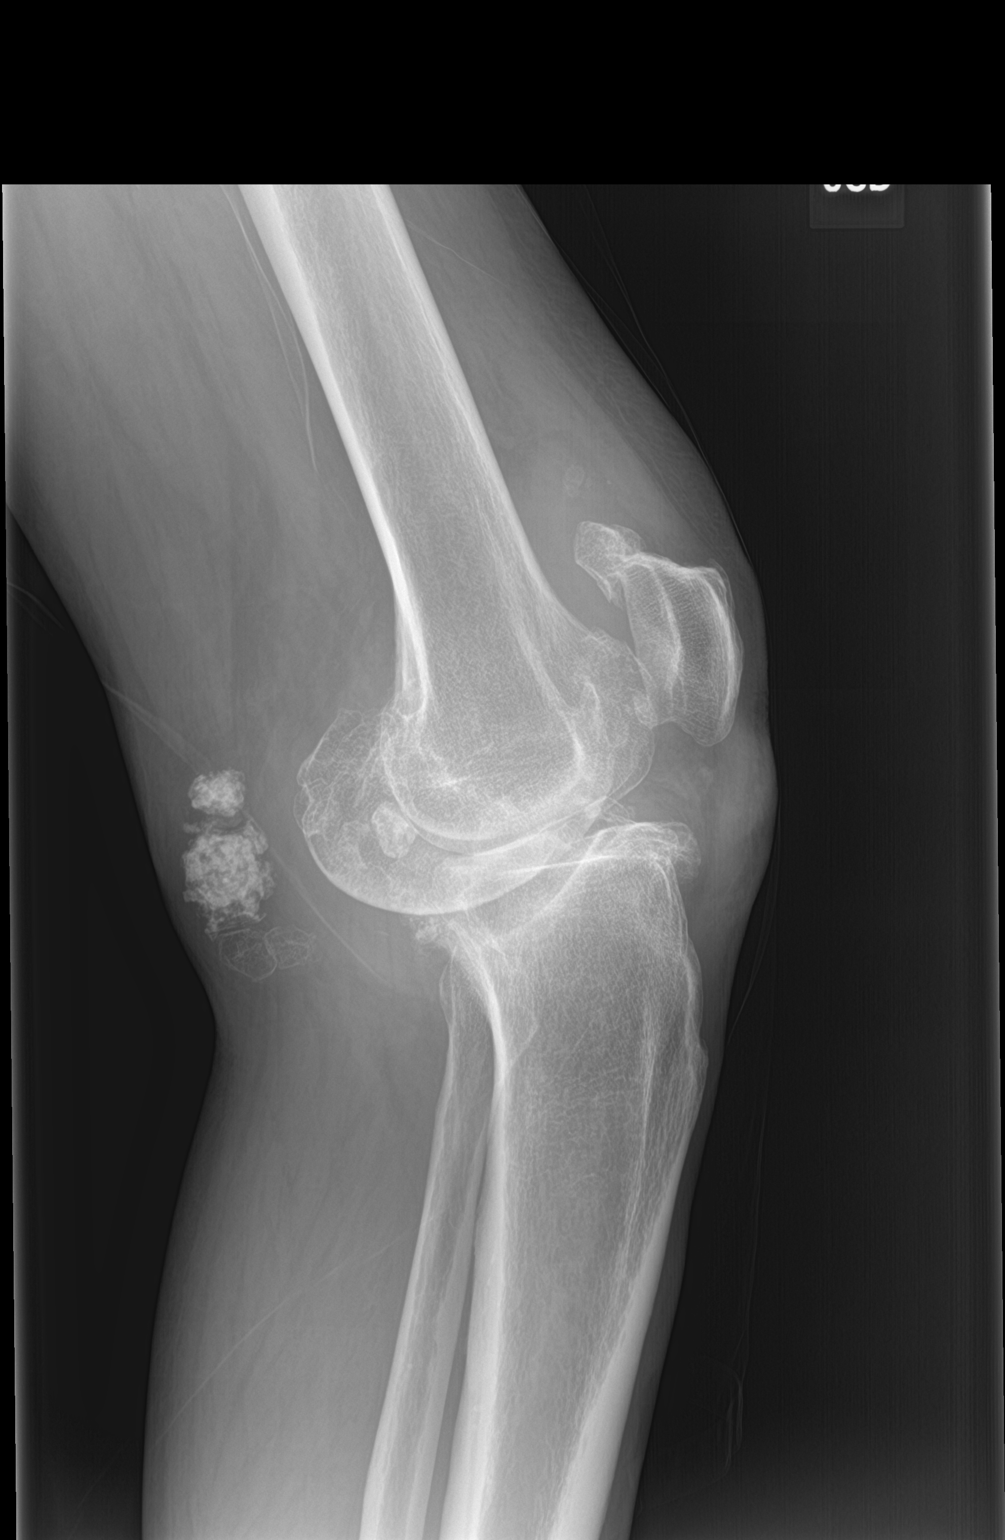

[2 of 2 positions shown; findings below may reference images not displayed]

FINDINGS: Suboptimal positioning for the study due to deformity.

Advanced tricompartmental degenerative change with joint space
narrowing. Multiple calcified loose bodies in the joint. Joint
effusion. There is depression of the medial tibial plateau.
Correlate with prior injury.
IMPRESSION: Advanced tricompartmental degenerative change with loose bodies and
joint effusion. Question chronic fracture medial tibial plateau.

## 2021-10-26 ENCOUNTER — Encounter (INDEPENDENT_AMBULATORY_CARE_PROVIDER_SITE_OTHER): Payer: Self-pay | Admitting: Ophthalmology

## 2021-10-26 ENCOUNTER — Ambulatory Visit (INDEPENDENT_AMBULATORY_CARE_PROVIDER_SITE_OTHER): Payer: Medicare Other | Admitting: Ophthalmology

## 2021-10-26 DIAGNOSIS — H401123 Primary open-angle glaucoma, left eye, severe stage: Secondary | ICD-10-CM

## 2021-10-26 DIAGNOSIS — H34832 Tributary (branch) retinal vein occlusion, left eye, with macular edema: Secondary | ICD-10-CM | POA: Diagnosis not present

## 2021-10-26 DIAGNOSIS — H43812 Vitreous degeneration, left eye: Secondary | ICD-10-CM

## 2021-10-26 MED ORDER — BEVACIZUMAB CHEMO INJECTION 1.25MG/0.05ML SYRINGE FOR KALEIDOSCOPE
1.2500 mg | INTRAVITREAL | Status: AC | PRN
  Administered 2021-10-26: 1.25 mg via INTRAVITREAL

## 2021-10-26 NOTE — Progress Notes (Signed)
10/26/2021     CHIEF COMPLAINT Patient presents for  Chief Complaint  Patient presents with   Branch Retinal Vein Occlusion      HISTORY OF PRESENT ILLNESS: Oscar Greene is a 71 y.o. male who presents to the clinic today for:   HPI   6 weeks for DILATE OS, AVASTIN OCT. Pt stated no changes in vision however pt stated, "I can tell when its time for my shot, my vision gets slightly blurry." Pt is currently taking Brimonidine- 1 drop in both eyes daily Timolol- 1 drop into both eyes 2x daily. Latanoprost- 1 drop into both eyes daily.   Last edited by Silvestre Moment on 10/26/2021  9:35 AM.      Referring physician: [name], MD [address] [facility] [address] [place],  [place] [address]  HISTORICAL INFORMATION:   Selected notes from the MEDICAL RECORD NUMBER    Lab Results  Component Value Date   HGBA1C 5.0 11/18/2013     CURRENT MEDICATIONS: Current Outpatient Medications (Ophthalmic Drugs)  Medication Sig   brimonidine (ALPHAGAN) 0.2 % ophthalmic solution 1 drop 2 (two) times daily.   dorzolamide-timolol (COSOPT) 22.3-6.8 MG/ML ophthalmic solution 1 DROP IN BOTH EYES TWICE A DAY   latanoprost (XALATAN) 0.005 % ophthalmic solution Place 1 drop into both eyes daily.   No current facility-administered medications for this visit. (Ophthalmic Drugs)   Current Outpatient Medications (Other)  Medication Sig   acetaminophen (TYLENOL) 500 MG tablet Take 1 tablet (500 mg total) by mouth every 6 (six) hours as needed.   cloNIDine (CATAPRES) 0.1 MG tablet TAKE 1 TABLET BY MOUTH TWICE A DAY   clopidogrel (PLAVIX) 75 MG tablet TAKE 1 TABLET BY MOUTH EVERY DAY   COVID-19 mRNA bivalent vaccine, Moderna, (MODERNA COVID-19 BIVAL BOOSTER) 50 MCG/0.5ML injection Inject into the muscle.   rosuvastatin (CRESTOR) 20 MG tablet TAKE 1 TABLET BY MOUTH EVERY DAY   No current facility-administered medications for this visit. (Other)      REVIEW OF SYSTEMS: ROS   Negative for:  Constitutional, Gastrointestinal, Neurological, Skin, Genitourinary, Musculoskeletal, HENT, Endocrine, Cardiovascular, Eyes, Respiratory, Psychiatric, Allergic/Imm, Heme/Lymph Last edited by Silvestre Moment on 10/26/2021  9:35 AM.       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 Katy Fitch   L frontal craniotomy to remove blood clot      FAMILY HISTORY Family History  Problem Relation Age of Onset   Cancer Mother    Hypertension Father     SOCIAL HISTORY Social History   Tobacco Use   Smoking status: Every Day    Packs/day: 1.00    Years: 45.00    Total pack years: 45.00    Types: Cigarettes   Smokeless tobacco: Never  Vaping Use   Vaping Use: Never used  Substance Use Topics   Alcohol use: Yes    Comment: occasional   Drug use: No  OPHTHALMIC EXAM:  Base Eye Exam     Visual Acuity (ETDRS)       Right Left   Dist [place] 20/30 -2 20/50 -2   Dist ph [place] 20/25 -2 20/40         Tonometry (Tonopen, 9:40 AM)       Right Left   Pressure 14 16         Pupils       Pupils APD   Right PERRL None   Left PERRL None         Visual Fields       Left Right   Restrictions Total superior temporal, inferior temporal deficiencies Total inferior nasal deficiency         Extraocular Movement       Right Left    Full Full         Neuro/Psych     Oriented x3: Yes   Mood/Affect: Normal         Dilation     Left eye: 2.5% Phenylephrine, 1.0% Mydriacyl @ 9:40 AM            Slit Lamp and Fundus Exam     External Exam       Right Left   External Normal Normal         Slit Lamp Exam       Right Left   Lids/Lashes Normal Normal   Conjunctiva/Sclera White and quiet White and quiet   Cornea Clear Clear   Anterior Chamber Deep and quiet Deep and quiet   Iris Round and reactive Round and reactive   Lens Posterior chamber intraocular lens Posterior chamber intraocular lens   Anterior Vitreous Normal Normal         Fundus Exam       Right Left   Posterior Vitreous  Posterior vitreous detachment, Central vitreous floaters   Disc  Collaterals on the nerve, no nvd   C/D Ratio  0.8   Macula  Microaneurysms, Hemorrhage, Cystoid macular edema, 90 D examination   Vessels  Old central retinal vein occlusion   Periphery  no neovascularization, 20 D examination, CME            IMAGING AND PROCEDURES  Imaging and Procedures for 10/26/21  OCT, Retina - OU - Both Eyes       Right Eye Quality was good. Scan locations included subfoveal. Central Foveal Thickness: 244. Progression has been stable. Findings include normal foveal contour.   Left Eye Quality was good. Scan locations included subfoveal. Central Foveal Thickness: 263. Progression has worsened. Findings include abnormal foveal contour, cystoid macular edema.   Notes Recurrent  minor CME, at 7 weeks from [facility] central retinal vein occlusion.  Stable over time worse CME OS at this time.  Today increased CME at 7-week interval.     Intravitreal Injection, Pharmacologic Agent - OS - Left Eye       Time Out 10/26/2021. 10:14 AM. Confirmed correct patient, procedure, site, and patient consented.   Anesthesia Topical anesthesia was used. Anesthetic medications included Lidocaine 4%.   Procedure Preparation included 5% betadine to ocular surface, 10% betadine to eyelids, Ofloxacin . A 30 gauge needle was used.   Injection: 1.25 mg Bevacizumab 1.'25mg'$ /0.41m   Route: Intravitreal, Site:  Left Eye   NDC: [id number] Lot:: [id number] Expiration date: 01/13/2022   Post-op Post injection exam found visual acuity of at least counting fingers. The patient tolerated the procedure well. There were no complications. The  patient received written and verbal post procedure care education. Post injection medications included ocuflox.              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  The patient was given access to printed information.  The treatment options including continued observation looking for spontaneous resolution versus grid laser versus intravitreal Kenalog injection were discussed.  PRIMARY THERAPY CONSISTS of Anti-VEGF Therapies, AVASTIN, LUCENTIS AND EYLEA.  Their usage was discussed to assist in halting the progression of Macular Edema, in order to preserve, protect or improve acuity.  Additionally, at times, limited focal laser therapy is used in the management.  The risks and benefits of all these options were discussed with the patient.  The patient's questions were answered.   OS, improved CME from BRVO today on Avastin.  Repeat injection today and extend interval examination next to 9 weeks  Primary open-angle glaucoma, severe stage Stable overall  Posterior vitreous detachment of left eye  The nature of posterior vitreous detachment was discussed with the patient as well as its physiology, its age prevalence, and its possible implication regarding retinal breaks and detachment.  An informational brochure was offered to the patient.  All the patient's questions were answered.  The patient was asked to return if new or different flashes or floaters develops.   Patient was instructed to contact office immediately if any new changes were noticed. I explained to the patient that vitreous inside the eye is similar to jello inside a bowl. As the  jello melts it can start to pull away from the bowl, similarly the vitreous throughout our lives can begin to pull away from the retina. That process is called a posterior vitreous detachment. In some cases, the vitreous can tug hard enough on the retina to form a retinal tear. I discussed with the patient the signs and symptoms of a retinal detachment.  Do not rub the eye.       ICD-10-CM   1. 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 Primary open angle glaucoma (POAG) of left eye, severe stage  H40.1123     3. Posterior vitreous detachment of left eye  H43.812       1.  OS doing well on chronic treatment and control of CME from BRVO.  Repeat Avastin today to maintain.  2.  OS looks great, repeat injection today  3.  Physiologic PVD continue to observe   continue on glaucoma medications  Ophthalmic Meds Ordered this visit:  Meds ordered this encounter  Medications   Bevacizumab (AVASTIN) SOLN 1.25 mg       Return in about 9 weeks (around 12/28/2021) for dilate, OS, AVASTIN OCT.  There are no Patient Instructions on file for this visit.   Explained the diagnoses, plan, and follow up with the patient and they expressed understanding.  Patient expressed understanding of the importance of proper follow up care.   Clent Demark Laelle Bridgett M.D. Diseases & Surgery of the Retina and Vitreous Retina & Diabetic [place] 10/26/21     Abbreviations: M myopia (nearsighted); A astigmatism; H hyperopia (farsighted); P presbyopia; Mrx spectacle prescription;  CTL contact lenses; OD right eye; OS left eye; OU  both eyes  XT exotropia; ET esotropia; PEK punctate epithelial keratitis; PEE punctate epithelial erosions; DES dry eye syndrome; MGD meibomian gland dysfunction; ATs artificial tears; PFAT's preservative free artificial  tears; [place] nuclear sclerotic cataract; PSC posterior subcapsular cataract; ERM epi-retinal membrane; PVD posterior vitreous detachment; RD retinal detachment; DM diabetes mellitus; DR diabetic retinopathy; NPDR non-proliferative diabetic retinopathy; PDR proliferative diabetic retinopathy; CSME clinically significant macular edema; DME diabetic macular edema; dbh dot blot hemorrhages; CWS cotton wool spot; POAG primary open angle glaucoma; C/D cup-to-disc ratio; HVF humphrey visual field; GVF goldmann visual field; OCT optical coherence tomography; IOP intraocular pressure; BRVO Branch retinal vein occlusion; CRVO central retinal vein occlusion; CRAO central retinal artery occlusion; BRAO branch retinal artery occlusion; RT retinal tear; SB scleral buckle; PPV pars plana vitrectomy; VH Vitreous hemorrhage; PRP panretinal laser photocoagulation; IVK intravitreal kenalog; VMT vitreomacular traction; MH Macular hole;  NVD neovascularization of the disc; NVE neovascularization elsewhere; AREDS age related eye disease study; ARMD age related macular degeneration; POAG primary open angle glaucoma; EBMD epithelial/anterior basement membrane dystrophy; ACIOL anterior chamber intraocular lens; IOL intraocular lens; PCIOL posterior chamber intraocular lens; Phaco/IOL phacoemulsification with intraocular lens placement; [facility] photorefractive keratectomy; LASIK laser assisted in situ keratomileusis; HTN hypertension; DM diabetes mellitus; COPD chronic obstructive pulmonary disease

## 2021-10-26 NOTE — Assessment & Plan Note (Signed)

## 2021-10-26 NOTE — Assessment & Plan Note (Signed)
The nature of branch retinal vein occlusion with macular edema was discussed.  The patient was given access to printed information.  The treatment options including continued observation looking for spontaneous resolution versus grid laser versus intravitreal Kenalog injection were discussed.  PRIMARY THERAPY CONSISTS of Anti-VEGF Therapies, AVASTIN, LUCENTIS AND EYLEA.  Their usage was discussed to assist in halting the progression of Macular Edema, in order to preserve, protect or improve acuity.  Additionally, at times, limited focal laser therapy is used in the management.  The risks and benefits of all these options were discussed with the patient.  The patient's questions were answered.   OS, improved CME from [facility] today on Avastin.  Repeat injection today and extend interval examination next to 9 weeks

## 2021-10-26 NOTE — Assessment & Plan Note (Signed)
Stable overall.  

## 2021-11-16 DIAGNOSIS — Z961 Presence of intraocular lens: Secondary | ICD-10-CM | POA: Diagnosis not present

## 2021-11-16 DIAGNOSIS — H348322 Tributary (branch) retinal vein occlusion, left eye, stable: Secondary | ICD-10-CM | POA: Diagnosis not present

## 2021-11-16 DIAGNOSIS — H401131 Primary open-angle glaucoma, bilateral, mild stage: Secondary | ICD-10-CM | POA: Diagnosis not present

## 2021-11-23 ENCOUNTER — Other Ambulatory Visit: Payer: Self-pay

## 2021-11-23 NOTE — Telephone Encounter (Signed)
Pharmacy faxed a refill request for rosuvastatin (CRESTOR) 20 MG tablet [[id number]]    Order Details Dose, Route, Frequency: As Directed  Dispense Quantity: 90 tablet Refills: 0        Sig: TAKE 1 TABLET BY MOUTH EVERY DAY       Start Date: 08/23/21 End Date: --  Written Date: 08/23/21 Expiration Date: 08/23/22  Original Order:  rosuvastatin (CRESTOR) 20 MG tablet [[id number]]

## 2021-11-24 MED ORDER — ROSUVASTATIN CALCIUM 20 MG PO TABS: 20.0000 mg | ORAL_TABLET | Freq: Every day | ORAL | 0 refills | Status: DC

## 2021-11-24 NOTE — Telephone Encounter (Signed)
Last OV 09/09/21 within 12 months. Will refill medications.  Requested Prescriptions  Pending Prescriptions Disp Refills  . rosuvastatin (CRESTOR) 20 MG tablet 90 tablet 0    Sig: Take 1 tablet (20 mg total) by mouth daily.     Cardiovascular:  Antilipid - Statins 2 Failed - 11/23/2021  1:38 PM      Failed - Cr in normal range and within 360 days    Creat  Date Value Ref Range Status  09/09/2021 1.37 (H) 0.70 - 1.28 mg/dL Final         Failed - Valid encounter within last 12 months    Recent Outpatient Visits          1 year ago Benign essential HTN   [place] Dennard Schaumann, Cammie Mcgee, MD   3 years ago Benign essential HTN   [place] Dennard Schaumann, Cammie Mcgee, MD   4 years ago Encounter for hepatitis C screening test for low risk patient   [place] [name], MD   6 years ago Lung field abnormal finding on examination   [place] Pickard, Cammie Mcgee, MD   6 years ago Routine general medical examination at a health care facility   [place], Cammie Mcgee, MD             Failed - Lipid Panel in normal range within the last 12 months    Cholesterol  Date Value Ref Range Status  09/09/2021 83 <200 mg/dL Final   LDL Cholesterol (Calc)  Date Value Ref Range Status  09/09/2021 23 mg/dL (calc) Final    Comment:    Reference range: <100 . Desirable range <100 mg/dL for primary prevention;   <70 mg/dL for patients with CHD or diabetic patients  with > or = 2 CHD risk factors. Marland Kitchen LDL-C is now calculated using the Martin-Hopkins  calculation, which is a validated novel method providing  better accuracy than the Friedewald equation in the  estimation of LDL-C.  Cresenciano Genre et al. Annamaria Helling. 2446;950(72): 2061-2068  (http://education.[facility].com/faq/FAQ164)    HDL  Date Value Ref Range Status  09/09/2021 43 > OR = 40 mg/dL Final   Triglycerides  Date Value Ref Range Status   09/09/2021 83 <150 mg/dL Final         Passed - Patient is not pregnant

## 2021-12-28 ENCOUNTER — Encounter (INDEPENDENT_AMBULATORY_CARE_PROVIDER_SITE_OTHER): Payer: Medicare Other | Admitting: Ophthalmology
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
## 2022-01-02 ENCOUNTER — Encounter (INDEPENDENT_AMBULATORY_CARE_PROVIDER_SITE_OTHER): Payer: Self-pay | Admitting: Ophthalmology

## 2022-01-02 ENCOUNTER — Ambulatory Visit (INDEPENDENT_AMBULATORY_CARE_PROVIDER_SITE_OTHER): Payer: Medicare Other | Admitting: Ophthalmology

## 2022-01-02 ENCOUNTER — Encounter (INDEPENDENT_AMBULATORY_CARE_PROVIDER_SITE_OTHER): Payer: Medicare Other | Admitting: Ophthalmology

## 2022-01-02 DIAGNOSIS — Z961 Presence of intraocular lens: Secondary | ICD-10-CM

## 2022-01-02 DIAGNOSIS — I10 Essential (primary) hypertension: Secondary | ICD-10-CM | POA: Diagnosis not present

## 2022-01-02 DIAGNOSIS — H35033 Hypertensive retinopathy, bilateral: Secondary | ICD-10-CM

## 2022-01-02 DIAGNOSIS — H34832 Tributary (branch) retinal vein occlusion, left eye, with macular edema: Secondary | ICD-10-CM

## 2022-01-02 DIAGNOSIS — H401123 Primary open-angle glaucoma, left eye, severe stage: Secondary | ICD-10-CM | POA: Diagnosis not present

## 2022-01-02 MED ORDER — BEVACIZUMAB CHEMO INJECTION 1.25MG/0.05ML SYRINGE FOR KALEIDOSCOPE
1.2500 mg | INTRAVITREAL | Status: AC | PRN
  Administered 2022-01-02: 1.25 mg via INTRAVITREAL

## 2022-02-10 NOTE — Progress Notes (Signed)
[facility] - Clinic Note  02/13/2022     CHIEF COMPLAINT Patient presents for Retina Follow Up   HISTORY OF PRESENT ILLNESS: Oscar Greene is a 71 y.o. male who presents to the clinic today for:   HPI     Retina Follow Up   Patient presents with  CRVO/BRVO.  In left eye.  This started 6 weeks ago.  Duration of 6 weeks.  I, the attending physician,  performed the HPI with the patient and updated documentation appropriately.        Comments   6 week retina follow up BRVO pt states vision in os little blurred and seeing some floaters denies flashes of light       Last edited by [name], MD on 02/13/2022  5:27 PM.    Pt states vision has improved, pt sees Dr. Hortense Ramal in January  Referring physician: Donita Brooks, MD [address] [facility] [address] [place],  [place] [address]  HISTORICAL INFORMATION:   Selected notes from the MEDICAL RECORD NUMBER Dr. Luciana Axe pt LEE:  Ocular Hx- PMH-    CURRENT MEDICATIONS: Current Outpatient Medications (Ophthalmic Drugs)  Medication Sig   brimonidine (ALPHAGAN) 0.2 % ophthalmic solution 1 drop 2 (two) times daily.   dorzolamide-timolol (COSOPT) 22.3-6.8 MG/ML ophthalmic solution 1 DROP IN BOTH EYES TWICE A DAY   latanoprost (XALATAN) 0.005 % ophthalmic solution Place 1 drop into both eyes daily.   No current facility-administered medications for this visit. (Ophthalmic Drugs)   Current Outpatient Medications (Other)  Medication Sig   acetaminophen (TYLENOL) 500 MG tablet Take 1 tablet (500 mg total) by mouth every 6 (six) hours as needed.   cloNIDine (CATAPRES) 0.1 MG tablet TAKE 1 TABLET BY MOUTH TWICE A DAY   clopidogrel (PLAVIX) 75 MG tablet TAKE 1 TABLET BY MOUTH EVERY DAY   COVID-19 mRNA bivalent vaccine, Moderna, (MODERNA COVID-19 BIVAL BOOSTER) 50 MCG/0.5ML injection Inject into the muscle.   rosuvastatin (CRESTOR) 20 MG tablet Take 1 tablet (20 mg total) by mouth daily.   No current  facility-administered medications for this visit. (Other)   REVIEW OF SYSTEMS:   ALLERGIES No Known Allergies  PAST MEDICAL HISTORY Past Medical History:  Diagnosis Date   AKI (acute kidney injury) (HCC) 06/2015   Cataract    CVA (cerebral vascular accident) (HCC)    Glaucoma    Headache(784.0)    History of traumatic head injury 03/14/1983   A pitching wedge hit him in left frontal area   Hypertension    Osteoporosis    Prostate cancer (HCC)    Dr. Sherryl Barters, Gleason 7   Thrombocytopenia [facility])    Past Surgical History:  Procedure Laterality Date   CATARACT EXTRACTION W/PHACO Bilateral 2013   Dr. Dione Booze   L frontal craniotomy to remove blood clot     FAMILY HISTORY Family History  Problem Relation Age of Onset   Cancer Mother    Hypertension Father    SOCIAL HISTORY Social History   Tobacco Use   Smoking status: Every Day    Packs/day: 1.00    Years: 45.00    Total pack years: 45.00    Types: Cigarettes   Smokeless tobacco: Never  Vaping Use   Vaping Use: Never used  Substance Use Topics   Alcohol use: Yes    Comment: occasional   Drug use: No       OPHTHALMIC EXAM:  Base Eye Exam     Visual Acuity (  Snellen - Linear)       Right Left   Dist [place] 20/40 20/40 -2   Dist ph [place] 20/30 -1 NI         Tonometry (Tonopen, 10:09 AM)       Right Left   Pressure 11 13         Pupils       Pupils Dark Light Shape React APD   Right PERRL 4 3 Round Brisk None   Left PERRL 4 3 Round Brisk None         Visual Fields       Left Right    Full Full         Extraocular Movement       Right Left    Full, Ortho Full, Ortho         Neuro/Psych     Oriented x3: Yes   Mood/Affect: Normal         Dilation     Both eyes: 2.5% Phenylephrine @ 10:09 AM           Slit Lamp and Fundus Exam     External Exam       Right Left   External Normal Normal         Slit Lamp Exam       Right Left   Lids/Lashes Dermatochalasis  Dermatochalasis - upper lid   Conjunctiva/Sclera Melanosis Melanosis   Cornea Well healed temporal cataract wound Well healed temporal cataract wound, trace Punctate epithelial erosions   Anterior Chamber Deep and quiet Deep and quiet   Iris Round and Dilated Round and Dilated   Lens Posterior chamber intraocular lens, PC IOL in good position w/ open PC PC IOL in good position w/ open PC   Anterior Vitreous Vitreous syneresis Vitreous syneresis, silicone oil micro bubbles, Posterior vitreous detachment, vitreous condensations         Fundus Exam       Right Left   Disc 3+ Pallor, Sharp rim, +cupping mild sup hyperemia, temp pallor, +cupping, vascular loops sup disc   C/D Ratio 0.9 0.85   Macula Flat, Blunted foveal reflex, No heme or edema Blunted foveal reflex, central edema/cystic changes -- improved / resolved, no heme   Vessels mild attenuation, mild tortuosity Attenuated, Copper wiring, 3+ tortuosity, +beading ST venule, sclerotic vessels ST periphery   Periphery Attached, No heme Attached, no heme, segmental PRP scars temporal periphery           IMAGING AND PROCEDURES  Imaging and Procedures for 02/13/2022  OCT, Retina - OU - Both Eyes       Right Eye Quality was good. Central Foveal Thickness: 234. Progression has been stable. Findings include normal foveal contour, no IRF, no SRF.   Left Eye Quality was good. Central Foveal Thickness: 223. Progression has improved. Findings include no IRF, no SRF, abnormal foveal contour, intraretinal hyper-reflective material, inner retinal atrophy (Interval improvement / resolution of central IRF/edema).   Notes *Images captured and stored on drive  Diagnosis / Impression:  OD: NFP, no IRF/SRF OS: Interval improvement / resolution of central IRF/edema  Clinical management:  See below  Abbreviations: NFP - Normal foveal profile. CME - cystoid macular edema. PED - pigment epithelial detachment. IRF - intraretinal fluid. SRF -  subretinal fluid. EZ - ellipsoid zone. ERM - epiretinal membrane. ORA - outer retinal atrophy. ORT - outer retinal tubulation. SRHM - subretinal hyper-reflective material. IRHM - intraretinal hyper-reflective material  Intravitreal Injection, Pharmacologic Agent - OS - Left Eye       Time Out 02/13/2022. 10:59 AM. Confirmed correct patient, procedure, site, and patient consented.   Anesthesia Topical anesthesia was used. Anesthetic medications included Lidocaine 2%, Proparacaine 0.5%.   Procedure Preparation included 5% betadine to ocular surface, eyelid speculum. A (32 g) needle was used.   Injection: 1.25 mg Bevacizumab 1.25mg /0.73ml   Route: Intravitreal, Site: Left Eye   NDC: [id number], Lot: [id number], Expiration date: 02/26/2022   Post-op Post injection exam found visual acuity of at least counting fingers. The patient tolerated the procedure well. There were no complications. The patient received written and verbal post procedure care education. Post injection medications were not given.            ASSESSMENT/PLAN:    ICD-10-CM   1. 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 Essential hypertension  I10     3. Hypertensive retinopathy of both eyes  H35.033     4. Pseudophakia of both eyes  Z96.1     5. Primary open angle glaucoma (POAG) of left eye, severe stage  H40.1123      BRVO w/ CME, OS - Pt transferred care from Dr. Luciana Axe due to insurance  - s/p IVA OS x19 from 06/26/19-10/26/21 -- on q6-7 wk schedule - s/p IVA OS #20 here (10.23.23, 9+wks ) - BCVA OS 20/40 -- stable - OCT OS shows interval improvement / resolution of central IRF/edema at 6 weeks - recommend IVA OS #21 today, 02/13/22 w/ f/u in 7 wks   - RBA of procedure discussed, questions answered - IVA informed consent obtained and  signed, 10.23.23 (OS) - see procedure note - f/u 7 wks -- DFE/OCT, possible injection  2,3. Hypertensive retinopathy OU - discussed importance of tight BP control - monitor  4. Pseudophakia OU  - s/p CE/IOL OU  - IOL in good position, doing well  - monitor  5. POAG OU, severe stage  - under the expert management of Dr. Dione Booze - IOP 11,13  - pt on brimonidine and cosopt bid OU, latanoprost qhs OU  Ophthalmic Meds Ordered this visit:  Meds ordered this encounter  Medications   Bevacizumab (AVASTIN) SOLN 1.25 mg     Return in about 7 weeks (around 04/03/2022) for BRVO OS, Dilated Exam, OCT, Possible Injxn.  There are no Patient Instructions on file for this visit.   Explained the diagnoses, plan, and follow up with the patient and they expressed understanding.  Patient expressed understanding of the importance of proper follow up care.   This document serves as a record of services personally performed by Karie Chimera, MD, PhD. It was created on their behalf by Annalee Genta, COMT. The creation of this record is the provider's dictation and/or activities during the visit.  Electronically signed by: Annalee Genta, COMT 02/14/22 11:54 PM  This document serves as a record of services personally performed by Karie Chimera, MD, PhD. It was created on their behalf by Glee Arvin. Manson Passey, OA an ophthalmic technician. The creation of this record is the provider's dictation and/or activities during the visit.    Electronically signed by: Glee Arvin. Manson Passey, [place] 12.04.2023 11:54 PM   Karie Chimera, M.D., Ph.D. Diseases & Surgery of the Retina and Vitreous  [facility]  I have reviewed the above documentation for accuracy and completeness, and I agree with the above. Karie Chimera, M.D., Ph.D. 02/15/22 12:03 AM  Abbreviations: M myopia (nearsighted); A astigmatism; H hyperopia (farsighted); P presbyopia; Mrx spectacle prescription;  CTL contact lenses; OD right  eye; OS left eye; OU both eyes  XT exotropia; ET esotropia; PEK punctate epithelial keratitis; PEE punctate epithelial erosions; DES dry eye syndrome; MGD meibomian gland dysfunction; ATs artificial tears; PFAT's preservative free artificial tears; NSC nuclear sclerotic cataract; PSC posterior subcapsular cataract; ERM epi-retinal membrane; PVD posterior vitreous detachment; RD retinal detachment; DM diabetes mellitus; DR diabetic retinopathy; NPDR non-proliferative diabetic retinopathy; PDR proliferative diabetic retinopathy; CSME clinically significant macular edema; DME diabetic macular edema; dbh dot blot hemorrhages; CWS cotton wool spot; POAG primary open angle glaucoma; C/D cup-to-disc ratio; HVF humphrey visual field; GVF goldmann visual field; OCT optical coherence tomography; IOP intraocular pressure; BRVO Branch retinal vein occlusion; CRVO central retinal vein occlusion; CRAO central retinal artery occlusion; BRAO branch retinal artery occlusion; RT retinal tear; SB scleral buckle; PPV pars plana vitrectomy; VH Vitreous hemorrhage; PRP panretinal laser photocoagulation; IVK intravitreal kenalog; VMT vitreomacular traction; MH Macular hole;  NVD neovascularization of the disc; NVE neovascularization elsewhere; AREDS age related eye disease study; ARMD age related macular degeneration; POAG primary open angle glaucoma; EBMD epithelial/anterior basement membrane dystrophy; ACIOL anterior chamber intraocular lens; IOL intraocular lens; PCIOL posterior chamber intraocular lens; Phaco/IOL phacoemulsification with intraocular lens placement; PRK photorefractive keratectomy; LASIK laser assisted in situ keratomileusis; HTN hypertension; DM diabetes mellitus; COPD chronic obstructive pulmonary disease

## 2022-02-13 ENCOUNTER — Encounter (INDEPENDENT_AMBULATORY_CARE_PROVIDER_SITE_OTHER): Payer: Self-pay | Admitting: Ophthalmology

## 2022-02-13 ENCOUNTER — Ambulatory Visit (INDEPENDENT_AMBULATORY_CARE_PROVIDER_SITE_OTHER): Payer: Medicare Other | Admitting: Ophthalmology

## 2022-02-13 DIAGNOSIS — Z961 Presence of intraocular lens: Secondary | ICD-10-CM

## 2022-02-13 DIAGNOSIS — H401123 Primary open-angle glaucoma, left eye, severe stage: Secondary | ICD-10-CM | POA: Diagnosis not present

## 2022-02-13 DIAGNOSIS — H35033 Hypertensive retinopathy, bilateral: Secondary | ICD-10-CM

## 2022-02-13 DIAGNOSIS — I10 Essential (primary) hypertension: Secondary | ICD-10-CM

## 2022-02-13 DIAGNOSIS — H34832 Tributary (branch) retinal vein occlusion, left eye, with macular edema: Secondary | ICD-10-CM | POA: Diagnosis not present

## 2022-02-13 MED ORDER — BEVACIZUMAB CHEMO INJECTION 1.25MG/0.05ML SYRINGE FOR KALEIDOSCOPE
1.2500 mg | INTRAVITREAL | Status: AC | PRN
  Administered 2022-02-13: 1.25 mg via INTRAVITREAL

## 2022-02-27 ENCOUNTER — Other Ambulatory Visit: Payer: Self-pay | Admitting: Family Medicine

## 2022-03-21 DIAGNOSIS — H401131 Primary open-angle glaucoma, bilateral, mild stage: Secondary | ICD-10-CM | POA: Diagnosis not present

## 2022-03-30 NOTE — Progress Notes (Shared)
[facility] Clinic Note  04/05/2022     CHIEF COMPLAINT Patient presents for Retina Follow Up   HISTORY OF PRESENT ILLNESS: Oscar Greene is a 72 y.o. male who presents to the clinic today for:   HPI     Retina Follow Up   Patient presents with  CRVO/BRVO.  In left eye.  This started 7 weeks ago.  I, the attending physician,  performed the HPI with the patient and updated documentation appropriately.        Comments   Patient here for 7 weeks retina follow up for BRVO OS. Patient states vision OS can tell it is time to get the shot. Using drops.      Last edited by Bernarda Caffey, MD on 04/05/2022 11:40 AM.      Referring physician: [facility] [address] [place],  [place] [address]  HISTORICAL INFORMATION:   Selected notes from the MEDICAL RECORD NUMBER Dr. Zadie Rhine pt LEE:  Ocular Hx- PMH-    CURRENT MEDICATIONS: Current Outpatient Medications (Ophthalmic Drugs)  Medication Sig   brimonidine (ALPHAGAN) 0.2 % ophthalmic solution 1 drop 2 (two) times daily.   dorzolamide-timolol (COSOPT) 22.3-6.8 MG/ML ophthalmic solution 1 DROP IN BOTH EYES TWICE A DAY   latanoprost (XALATAN) 0.005 % ophthalmic solution Place 1 drop into both eyes daily.   No current facility-administered medications for this visit. (Ophthalmic Drugs)   Current Outpatient Medications (Other)  Medication Sig   acetaminophen (TYLENOL) 500 MG tablet Take 1 tablet (500 mg total) by mouth every 6 (six) hours as needed.   cloNIDine (CATAPRES) 0.1 MG tablet TAKE 1 TABLET BY MOUTH TWICE A DAY   clopidogrel (PLAVIX) 75 MG tablet TAKE 1 TABLET BY MOUTH EVERY DAY   COVID-19 mRNA bivalent vaccine, Moderna, (MODERNA COVID-19 BIVAL BOOSTER) 50 MCG/0.5ML injection Inject into the muscle.   rosuvastatin (CRESTOR) 20 MG tablet TAKE 1 TABLET BY MOUTH EVERY DAY   No current facility-administered medications for this visit. (Other)   REVIEW OF SYSTEMS: ROS    Positive for: Gastrointestinal, Neurological, Eyes Negative for: Constitutional, Skin, Genitourinary, Musculoskeletal, HENT, Endocrine, Cardiovascular, Respiratory, Psychiatric, Allergic/Imm, Heme/Lymph Last edited by Theodore Demark, COA on 04/05/2022 10:28 AM.     ALLERGIES No Known Allergies  PAST MEDICAL HISTORY Past Medical History:  Diagnosis Date   AKI (acute kidney injury) ([place]) 06/2015   Cataract    CVA (cerebral vascular accident) ([place])    Glaucoma    Headache(784.0)    History of traumatic head injury 03/14/1983   A pitching wedge hit him in left frontal area   Hypertension    Osteoporosis    Prostate cancer ([place])    Dr. Pilar Jarvis, Gleason 7   Thrombocytopenia [facility])    Past Surgical History:  Procedure Laterality Date   CATARACT EXTRACTION W/PHACO Bilateral 2013   Dr. Katy Fitch   L frontal craniotomy to remove blood clot     FAMILY HISTORY Family History  Problem Relation Age of Onset   Cancer Mother    Hypertension Father    SOCIAL HISTORY Social History   Tobacco Use   Smoking status: Every Day    Packs/day: 1.00    Years: 45.00    Total pack years: 45.00    Types: Cigarettes   Smokeless tobacco: Never  Vaping Use   Vaping Use: Never used  Substance Use Topics   Alcohol use: Yes    Comment: occasional   Drug use:  No       OPHTHALMIC EXAM:  Base Eye Exam     Visual Acuity (Snellen - Linear)       Right Left   Dist [place] 20/40 20/50 +1   Dist ph [place] 20/30 -1 20/40 -2         Tonometry (Tonopen, 10:26 AM)       Right Left   Pressure 05 22         Pupils       Dark Light Shape React APD   Right 4 3 Round Brisk None   Left 4 3 Round Brisk None         Visual Fields (Counting fingers)       Left Right    Full Full         Extraocular Movement       Right Left    Full, Ortho Full, Ortho         Neuro/Psych     Oriented x3: Yes   Mood/Affect: Normal         Dilation     Both eyes: 1.0% Mydriacyl, 2.5%  Phenylephrine @ 10:25 AM           Slit Lamp and Fundus Exam     External Exam       Right Left   External Normal Normal         Slit Lamp Exam       Right Left   Lids/Lashes Dermatochalasis Dermatochalasis - upper lid   Conjunctiva/Sclera Melanosis Melanosis   Cornea Well healed temporal cataract wound Well healed temporal cataract wound, trace Punctate epithelial erosions   Anterior Chamber Deep and quiet Deep and quiet   Iris Round and Dilated Round and Dilated   Lens Posterior chamber intraocular lens, PC IOL in good position w/ open PC PC IOL in good position w/ open PC   Anterior Vitreous Vitreous syneresis Vitreous syneresis, silicone oil micro bubbles, Posterior vitreous detachment, vitreous condensations         Fundus Exam       Right Left   Disc 3+ Pallor, Sharp rim, +cupping, Thin inferior rim mild sup hyperemia, temp pallor, +cupping, vascular loops sup disc   C/D Ratio 0.9 0.85   Macula Flat, Blunted foveal reflex, No heme or edema Blunted foveal reflex, central edema/cystic changes -- re-developed, no heme   Vessels mild attenuation, mild tortuosity Attenuated, Copper wiring, 3+ tortuosity, +beading ST venule, sclerotic vessels ST periphery   Periphery Attached, No heme Attached, no heme, segmental PRP scars temporal periphery           IMAGING AND PROCEDURES  Imaging and Procedures for 04/05/2022  OCT, Retina - OU - Both Eyes       Right Eye Quality was good. Central Foveal Thickness: 241. Progression has been stable. Findings include normal foveal contour, no IRF, no SRF.   Left Eye Quality was good. Central Foveal Thickness: 273. Progression has worsened. Findings include no SRF, abnormal foveal contour, intraretinal hyper-reflective material, intraretinal fluid, inner retinal atrophy (Interval re-development of central IRF/edema greatest nasal fovea).   Notes *Images captured and stored on drive  Diagnosis / Impression:  OD: NFP, no  IRF/SRF OS: Interval re-development of central IRF/edema - greatest nasal fovea  Clinical management:  See below  Abbreviations: NFP - Normal foveal profile. CME - cystoid macular edema. PED - pigment epithelial detachment. IRF - intraretinal fluid. SRF - subretinal fluid. EZ - ellipsoid zone. ERM - epiretinal  membrane. ORA - outer retinal atrophy. ORT - outer retinal tubulation. SRHM - subretinal hyper-reflective material. IRHM - intraretinal hyper-reflective material      Intravitreal Injection, Pharmacologic Agent - OS - Left Eye       Time Out 04/05/2022. 10:49 AM. Confirmed correct patient, procedure, site, and patient consented.   Anesthesia Topical anesthesia was used. Anesthetic medications included Lidocaine 2%, Proparacaine 0.5%.   Procedure Preparation included 5% betadine to ocular surface, eyelid speculum. A (32 g) needle was used.   Injection: 1.25 mg Bevacizumab 1.'25mg'$ /0.71m   Route: Intravitreal, Site: Left Eye   NDC: [id number] Lot:: [id number] Expiration date: 05/12/2022   Post-op Post injection exam found visual acuity of at least counting fingers. The patient tolerated the procedure well. There were no complications. The patient received written and verbal post procedure care education. Post injection medications were not given.            ASSESSMENT/PLAN:    ICD-10-CM   1. 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 Essential hypertension  I10     3. Hypertensive retinopathy of both eyes  H35.033     4. Pseudophakia of both eyes  Z96.1     5. Primary open angle glaucoma (POAG) of left eye, severe stage  H40.1123      BRVO w/ CME, OS - Pt transferred care from Dr. RZadie Rhinedue to insurance  - s/p IVA OS x19 from 06/26/19-10/26/21 -- on q6-7 wk schedule - s/p IVA OS #20 here (10.23.23,  9+wks ), #21 (12.04.23) **h/o increased IRF/edema at 7 wks on 01.24.24** - BCVA OS 20/40 -- stable - OCT OS shows Interval re-development of central IRF/edema greatest nasal fovea at 7 weeks - recommend IVA OS #22 today, 01.24.24 w/ f/u back to 5-6 wks   - RBA of procedure discussed, questions answered - IVA informed consent obtained and signed, 10.23.23 (OS) - see procedure note - f/u 5-6 wks -- DFE/OCT, possible injection  2,3. Hypertensive retinopathy OU - discussed importance of tight BP control - monitor  4. Pseudophakia OU  - s/p CE/IOL OU  - IOL in good position, doing well  - monitor  5. POAG OU, severe stage  - under the expert management of Dr. GKaty Fitch- IOP 05,22  - pt on brimonidine and cosopt bid OU, latanoprost qhs OU  Ophthalmic Meds Ordered this visit:  Meds ordered this encounter  Medications   Bevacizumab (AVASTIN) SOLN 1.25 mg     Return for f/u 5-6 weeks, BRVO OS, DFE, OCT, Possible Injxn.  There are no Patient Instructions on file for this visit.   Explained the diagnoses, plan, and follow up with the patient and they expressed understanding.  Patient expressed understanding of the importance of proper follow up care.   This document serves as a record of services personally performed by BGardiner Sleeper MD, PhD. It was created on their behalf by MOrvan Falconer an ophthalmic technician. The creation of this record is the provider's dictation and/or activities during the visit.    Electronically signed by: MOrvan Falconer OA, 04/05/22  11:50 AM  This document serves as a record of services personally performed by BGardiner Sleeper MD, PhD. It was created on their behalf by ASan Jetty BOwens Shark OA an ophthalmic technician. The creation of  this record is the provider's dictation and/or activities during the visit.    Electronically signed by: San Jetty. Owens Shark, [place] 01.24.2024 11:50 AM  Gardiner Sleeper, M.D., Ph.D. Diseases & Surgery of the Retina and  Vitreous [facility]  I have reviewed the above documentation for accuracy and completeness, and I agree with the above. Gardiner Sleeper, M.D., Ph.D. 04/05/22 11:50 AM   Abbreviations: M myopia (nearsighted); A astigmatism; H hyperopia (farsighted); P presbyopia; Mrx spectacle prescription;  CTL contact lenses; OD right eye; OS left eye; OU both eyes  XT exotropia; ET esotropia; PEK punctate epithelial keratitis; PEE punctate epithelial erosions; DES dry eye syndrome; MGD meibomian gland dysfunction; ATs artificial tears; PFAT's preservative free artificial tears; [place] nuclear sclerotic cataract; PSC posterior subcapsular cataract; ERM epi-retinal membrane; PVD posterior vitreous detachment; RD retinal detachment; DM diabetes mellitus; DR diabetic retinopathy; NPDR non-proliferative diabetic retinopathy; PDR proliferative diabetic retinopathy; CSME clinically significant macular edema; DME diabetic macular edema; dbh dot blot hemorrhages; CWS cotton wool spot; POAG primary open angle glaucoma; C/D cup-to-disc ratio; HVF humphrey visual field; GVF goldmann visual field; OCT optical coherence tomography; IOP intraocular pressure; BRVO Branch retinal vein occlusion; CRVO central retinal vein occlusion; CRAO central retinal artery occlusion; BRAO branch retinal artery occlusion; RT retinal tear; SB scleral buckle; PPV pars plana vitrectomy; VH Vitreous hemorrhage; PRP panretinal laser photocoagulation; IVK intravitreal kenalog; VMT vitreomacular traction; MH Macular hole;  NVD neovascularization of the disc; NVE neovascularization elsewhere; AREDS age related eye disease study; ARMD age related macular degeneration; POAG primary open angle glaucoma; EBMD epithelial/anterior basement membrane dystrophy; ACIOL anterior chamber intraocular lens; IOL intraocular lens; PCIOL posterior chamber intraocular lens; Phaco/IOL phacoemulsification with intraocular lens placement; [place] photorefractive  keratectomy; LASIK laser assisted in situ keratomileusis; HTN hypertension; DM diabetes mellitus; COPD chronic obstructive pulmonary disease

## 2022-04-05 ENCOUNTER — Encounter (INDEPENDENT_AMBULATORY_CARE_PROVIDER_SITE_OTHER): Payer: Self-pay | Admitting: Ophthalmology

## 2022-04-05 ENCOUNTER — Ambulatory Visit (INDEPENDENT_AMBULATORY_CARE_PROVIDER_SITE_OTHER): Payer: Medicare Other | Admitting: Ophthalmology

## 2022-04-05 DIAGNOSIS — H35352 Cystoid macular degeneration, left eye: Secondary | ICD-10-CM

## 2022-04-05 DIAGNOSIS — H401123 Primary open-angle glaucoma, left eye, severe stage: Secondary | ICD-10-CM | POA: Diagnosis not present

## 2022-04-05 DIAGNOSIS — H35033 Hypertensive retinopathy, bilateral: Secondary | ICD-10-CM | POA: Diagnosis not present

## 2022-04-05 DIAGNOSIS — I10 Essential (primary) hypertension: Secondary | ICD-10-CM | POA: Diagnosis not present

## 2022-04-05 DIAGNOSIS — Z961 Presence of intraocular lens: Secondary | ICD-10-CM | POA: Diagnosis not present

## 2022-04-05 DIAGNOSIS — H43812 Vitreous degeneration, left eye: Secondary | ICD-10-CM

## 2022-04-05 DIAGNOSIS — H34832 Tributary (branch) retinal vein occlusion, left eye, with macular edema: Secondary | ICD-10-CM

## 2022-04-05 MED ORDER — BEVACIZUMAB CHEMO INJECTION 1.25MG/0.05ML SYRINGE FOR KALEIDOSCOPE
1.2500 mg | INTRAVITREAL | Status: AC | PRN
  Administered 2022-04-05: 1.25 mg via INTRAVITREAL

## 2022-04-21 ENCOUNTER — Other Ambulatory Visit: Payer: Self-pay

## 2022-04-21 MED ORDER — CLOPIDOGREL BISULFATE 75 MG PO TABS: 75.0000 mg | ORAL_TABLET | Freq: Every day | ORAL | 0 refills | Status: DC

## 2022-04-21 NOTE — Telephone Encounter (Signed)
Patient needs OV, will refill for 30 days until OV can be made.  Requested Prescriptions  Pending Prescriptions Disp Refills   clopidogrel (PLAVIX) 75 MG tablet 30 tablet 0    Sig: Take 1 tablet (75 mg total) by mouth daily.     Hematology: Antiplatelets - clopidogrel Failed - 04/21/2022  3:18 PM      Failed - HCT in normal range and within 180 days    HCT  Date Value Ref Range Status  09/09/2021 39.0 38.5 - 50.0 % Final         Failed - HGB in normal range and within 180 days    Hemoglobin  Date Value Ref Range Status  09/09/2021 13.3 13.2 - 17.1 g/dL Final         Failed - PLT in normal range and within 180 days    Platelets  Date Value Ref Range Status  09/09/2021 127 (L) 140 - 400 Thousand/uL Final         Failed - Cr in normal range and within 360 days    Creat  Date Value Ref Range Status  09/09/2021 1.37 (H) 0.70 - 1.28 mg/dL Final         Failed - Valid encounter within last 6 months    Recent Outpatient Visits           1 year ago Benign essential HTN   [place], Warren T, MD   3 years ago Benign essential HTN   [place], Cammie Mcgee, MD   4 years ago Encounter for hepatitis C screening test for low risk patient   [place] [name], MD   6 years ago Lung field abnormal finding on examination   [place], Cammie Mcgee, MD   7 years ago Routine general medical examination at a health care facility   [place], Cammie Mcgee, MD

## 2022-04-21 NOTE — Telephone Encounter (Signed)
Prescription Request  04/21/2022  Is this a "Controlled Substance" medicine? No  LOV: 09/09/21  What is the name of the medication or equipment? clopidogrel (PLAVIX) 75 MG tablet  Have you contacted your pharmacy to request a refill? Yes   Which pharmacy would you like this sent to?  [id number]: [phone]: [phone]   Patient notified that their request is being sent to the clinical staff for review and that they should receive a response within 2 business days.   Please advise at [place]

## 2022-04-25 ENCOUNTER — Telehealth: Payer: Self-pay | Admitting: Family Medicine

## 2022-04-25 NOTE — Telephone Encounter (Signed)
Left message for patient to call back and schedule Medicare Annual Wellness Visit (AWV) in office.   If not able to come in office, please offer to do virtually or by telephone.   Last AWV:04/08/2021   Please schedule at any time with [facility]Nurse Health Advisor.  30 minute appointment  Any questions, please contact me at [phone]   Thank you,   [facility]  Ambulatory Clinical Support for [facility] [facility] ??[id number] or ??[phone]
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
## 2022-05-10 ENCOUNTER — Ambulatory Visit (INDEPENDENT_AMBULATORY_CARE_PROVIDER_SITE_OTHER): Payer: Medicare Other | Admitting: Ophthalmology

## 2022-05-10 ENCOUNTER — Encounter (INDEPENDENT_AMBULATORY_CARE_PROVIDER_SITE_OTHER): Payer: Self-pay | Admitting: Ophthalmology

## 2022-05-10 DIAGNOSIS — I10 Essential (primary) hypertension: Secondary | ICD-10-CM

## 2022-05-10 DIAGNOSIS — Z961 Presence of intraocular lens: Secondary | ICD-10-CM | POA: Diagnosis not present

## 2022-05-10 DIAGNOSIS — H401123 Primary open-angle glaucoma, left eye, severe stage: Secondary | ICD-10-CM

## 2022-05-10 DIAGNOSIS — H35033 Hypertensive retinopathy, bilateral: Secondary | ICD-10-CM

## 2022-05-10 DIAGNOSIS — H34832 Tributary (branch) retinal vein occlusion, left eye, with macular edema: Secondary | ICD-10-CM

## 2022-05-10 MED ORDER — BEVACIZUMAB CHEMO INJECTION 1.25MG/0.05ML SYRINGE FOR KALEIDOSCOPE
1.2500 mg | INTRAVITREAL | Status: AC | PRN
  Administered 2022-05-10: 1.25 mg via INTRAVITREAL

## 2022-05-14 ENCOUNTER — Other Ambulatory Visit: Payer: Self-pay | Admitting: Family Medicine

## 2022-05-24 NOTE — Patient Instructions (Incomplete)
Oscar Greene , Thank you for taking time to come for your Medicare Wellness Visit. I appreciate your ongoing commitment to your health goals. Please review the following plan we discussed and let me know if I can assist you in the future.   These are the goals we discussed:  Goals      Exercise 3x per week (30 min per time)     Continue to work and move.         This is a list of the screening recommended for you and due dates:  Health Maintenance  Topic Date Due   DTaP/Tdap/Td vaccine (1 - Tdap) Never done   Colon Cancer Screening  Never done   Screening for Lung Cancer  Never done   Zoster (Shingles) Vaccine (2 of 2) 06/04/2021   Flu Shot  10/11/2021   COVID-19 Vaccine (4 - 2023-24 season) 11/11/2021   Medicare Annual Wellness Visit  04/08/2022   Pneumonia Vaccine  Completed   Hepatitis C Screening: USPSTF Recommendation to screen - Ages 18-79 yo.  Completed   HPV Vaccine  Aged Out    Advanced directives: We have a copy of your advanced directives available in your record should your provider ever need to access them.   Conditions/risks identified: Aim for 30 minutes of exercise or brisk walking, 6-8 glasses of water, and 5 servings of fruits and vegetables each day.   Next appointment: Follow up in one year for your annual wellness visit.   Preventive Care 109 Years and Older, Male  Preventive care refers to lifestyle choices and visits with your health care provider that can promote health and wellness. What does preventive care include? A yearly physical exam. This is also called an annual well check. Dental exams once or twice a year. Routine eye exams. Ask your health care provider how often you should have your eyes checked. Personal lifestyle choices, including: Daily care of your teeth and gums. Regular physical activity. Eating a healthy diet. Avoiding tobacco and drug use. Limiting alcohol use. Practicing safe sex. Taking low doses of aspirin every  day. Taking vitamin and mineral supplements as recommended by your health care provider. What happens during an annual well check? The services and screenings done by your health care provider during your annual well check will depend on your age, overall health, lifestyle risk factors, and family history of disease. Counseling  Your health care provider may ask you questions about your: Alcohol use. Tobacco use. Drug use. Emotional well-being. Home and relationship well-being. Sexual activity. Eating habits. History of falls. Memory and ability to understand (cognition). Work and work Statistician. Screening  You may have the following tests or measurements: Height, weight, and BMI. Blood pressure. Lipid and cholesterol levels. These may be checked every 5 years, or more frequently if you are over 75 years old. Skin check. Lung cancer screening. You may have this screening every year starting at age 15 if you have a 30-pack-year history of smoking and currently smoke or have quit within the past 15 years. Fecal occult blood test (FOBT) of the stool. You may have this test every year starting at age 78. Flexible sigmoidoscopy or colonoscopy. You may have a sigmoidoscopy every 5 years or a colonoscopy every 10 years starting at age 64. Prostate cancer screening. Recommendations will vary depending on your family history and other risks. Hepatitis C blood test. Hepatitis B blood test. Sexually transmitted disease (STD) testing. Diabetes screening. This is done by checking your blood sugar (  glucose) after you have not eaten for a while (fasting). You may have this done every 1-3 years. Abdominal aortic aneurysm (AAA) screening. You may need this if you are a current or former smoker. Osteoporosis. You may be screened starting at age 48 if you are at high risk. Talk with your health care provider about your test results, treatment options, and if necessary, the need for more  tests. Vaccines  Your health care provider may recommend certain vaccines, such as: Influenza vaccine. This is recommended every year. Tetanus, diphtheria, and acellular pertussis (Tdap, Td) vaccine. You may need a Td booster every 10 years. Zoster vaccine. You may need this after age 12. Pneumococcal 13-valent conjugate (PCV13) vaccine. One dose is recommended after age 75. Pneumococcal polysaccharide (PPSV23) vaccine. One dose is recommended after age 53. Talk to your health care provider about which screenings and vaccines you need and how often you need them. This information is not intended to replace advice given to you by your health care provider. Make sure you discuss any questions you have with your health care provider. Document Released: 03/26/2015 Document Revised: 11/17/2015 Document Reviewed: 12/29/2014 Elsevier Interactive Patient Education  2017 [place] Prevention in the Home Falls can cause injuries. They can happen to people of all ages. There are many things you can do to make your home safe and to help prevent falls. What can I do on the outside of my home? Regularly fix the edges of walkways and driveways and fix any cracks. Remove anything that might make you trip as you walk through a door, such as a raised step or threshold. Trim any bushes or trees on the path to your home. Use bright outdoor lighting. Clear any walking paths of anything that might make someone trip, such as rocks or tools. Regularly check to see if handrails are loose or broken. Make sure that both sides of any steps have handrails. Any raised decks and porches should have guardrails on the edges. Have any leaves, snow, or ice cleared regularly. Use sand or salt on walking paths during winter. Clean up any spills in your garage right away. This includes oil or grease spills. What can I do in the bathroom? Use night lights. Install grab bars by the toilet and in the tub and shower.  Do not use towel bars as grab bars. Use non-skid mats or decals in the tub or shower. If you need to sit down in the shower, use a plastic, non-slip stool. Keep the floor dry. Clean up any water that spills on the floor as soon as it happens. Remove soap buildup in the tub or shower regularly. Attach bath mats securely with double-sided non-slip rug tape. Do not have throw rugs and other things on the floor that can make you trip. What can I do in the bedroom? Use night lights. Make sure that you have a light by your bed that is easy to reach. Do not use any sheets or blankets that are too big for your bed. They should not hang down onto the floor. Have a firm chair that has side arms. You can use this for support while you get dressed. Do not have throw rugs and other things on the floor that can make you trip. What can I do in the kitchen? Clean up any spills right away. Avoid walking on wet floors. Keep items that you use a lot in easy-to-reach places. If you need to reach something above you, use  a strong step stool that has a grab bar. Keep electrical cords out of the way. Do not use floor polish or wax that makes floors slippery. If you must use wax, use non-skid floor wax. Do not have throw rugs and other things on the floor that can make you trip. What can I do with my stairs? Do not leave any items on the stairs. Make sure that there are handrails on both sides of the stairs and use them. Fix handrails that are broken or loose. Make sure that handrails are as long as the stairways. Check any carpeting to make sure that it is firmly attached to the stairs. Fix any carpet that is loose or worn. Avoid having throw rugs at the top or bottom of the stairs. If you do have throw rugs, attach them to the floor with carpet tape. Make sure that you have a light switch at the top of the stairs and the bottom of the stairs. If you do not have them, ask someone to add them for you. What else  can I do to help prevent falls? Wear shoes that: Do not have high heels. Have rubber bottoms. Are comfortable and fit you well. Are closed at the toe. Do not wear sandals. If you use a stepladder: Make sure that it is fully opened. Do not climb a closed stepladder. Make sure that both sides of the stepladder are locked into place. Ask someone to hold it for you, if possible. Clearly mark and make sure that you can see: Any grab bars or handrails. First and last steps. Where the edge of each step is. Use tools that help you move around (mobility aids) if they are needed. These include: Canes. Walkers. Scooters. Crutches. Turn on the lights when you go into a dark area. Replace any light bulbs as soon as they burn out. Set up your furniture so you have a clear path. Avoid moving your furniture around. If any of your floors are uneven, fix them. If there are any pets around you, be aware of where they are. Review your medicines with your doctor. Some medicines can make you feel dizzy. This can increase your chance of falling. Ask your doctor what other things that you can do to help prevent falls. This information is not intended to replace advice given to you by your health care provider. Make sure you discuss any questions you have with your health care provider. Document Released: 12/24/2008 Document Revised: 08/05/2015 Document Reviewed: 04/03/2014 Elsevier Interactive Patient Education  2017 [employer].

## 2022-05-25 ENCOUNTER — Other Ambulatory Visit: Payer: Self-pay | Admitting: Family Medicine

## 2022-05-25 ENCOUNTER — Ambulatory Visit (INDEPENDENT_AMBULATORY_CARE_PROVIDER_SITE_OTHER): Payer: Medicare Other

## 2022-05-25 VITALS — BP 116/70 | Ht 69.0 in | Wt 159.0 lb

## 2022-05-25 DIAGNOSIS — Z0000 Encounter for general adult medical examination without abnormal findings: Secondary | ICD-10-CM | POA: Diagnosis not present

## 2022-05-25 NOTE — Progress Notes (Signed)
Subjective:   Oscar Greene is a 72 y.o. male who presents for Medicare Annual/Subsequent preventive examination.  Review of Systems     Cardiac Risk Factors include: advanced age (>59mn, >>46women);dyslipidemia;male gender;hypertension;smoking/ tobacco exposure     Objective:    Today's Vitals   05/25/22 0859  BP: 116/70  Weight: 159 lb (72.1 kg)  Height: '5\' 9"'$  (1.753 m)   Body mass index is 23.48 kg/m.     05/25/2022    9:22 AM 04/08/2021    9:11 AM 06/16/2015    2:34 PM 11/17/2013    8:00 PM 11/17/2013   10:30 AM  Advanced Directives  Does Patient Have a Medical Advance Directive? Yes No Yes No No  Type of [place]   Does patient want to make changes to medical advance directive? No - Patient declined  No - Patient declined    Copy of [place]? Yes - validated most recent copy scanned in chart (See row information)  No - copy requested    Would patient like information on creating a medical advance directive?  No - Patient declined  No - patient declined information No - patient declined information    Current Medications (verified) Outpatient Encounter Medications as of 05/25/2022  Medication Sig   acetaminophen (TYLENOL) 500 MG tablet Take 1 tablet (500 mg total) by mouth every 6 (six) hours as needed.   brimonidine (ALPHAGAN) 0.2 % ophthalmic solution 1 drop 2 (two) times daily.   cloNIDine (CATAPRES) 0.1 MG tablet TAKE 1 TABLET BY MOUTH TWICE A DAY   clopidogrel (PLAVIX) 75 MG tablet TAKE 1 TABLET BY MOUTH EVERY DAY   dorzolamide-timolol (COSOPT) 22.3-6.8 MG/ML ophthalmic solution 1 DROP IN BOTH EYES TWICE A DAY   latanoprost (XALATAN) 0.005 % ophthalmic solution Place 1 drop into both eyes daily.   naproxen sodium (ALEVE) 220 MG tablet Take 220 mg by mouth daily as needed.   rosuvastatin (CRESTOR) 20 MG tablet TAKE 1 TABLET BY MOUTH EVERY DAY    [DISCONTINUED] COVID-19 mRNA bivalent vaccine, Moderna, (MODERNA COVID-19 BIVAL BOOSTER) 50 MCG/0.5ML injection Inject into the muscle.   [DISCONTINUED] rosuvastatin (CRESTOR) 20 MG tablet TAKE 1 TABLET BY MOUTH EVERY DAY   No facility-administered encounter medications on file as of 05/25/2022.    Allergies (verified) Patient has no known allergies.   History: Past Medical History:  Diagnosis Date   [place] ([place] kidney [place]) ([place] 06[place]   Cataract    CVA (cerebral vascular accident) ([place]    Glaucoma    Headache(784.0)    History of traumatic head injury 03/14/1983   A pitching wedge hit him in left frontal area   Hypertension    Osteoporosis    [place]    Dr. BPilar Jarvis Gleason 7   Thrombocytopenia ([facility]    Past Surgical History:  Procedure Laterality Date   CATARACT EXTRACTION W/PHACO Bilateral 2013   Dr. GKaty Fitch  L frontal craniotomy to remove blood clot     Family History  Problem Relation Age of Onset   Cancer Mother    Hypertension Father    Social History   Socioeconomic History   Marital status: Single    Spouse name: Not on file   Number of children: Not on file   Years of education: Not on file   Highest education level: Not on file  Occupational History   Not on file  Tobacco Use   Smoking status: Every Day    Packs/day: 1.00    Years: 45.00    Additional pack years: 0.00    Total pack years: 45.00    Types: Cigarettes   Smokeless tobacco: Never  Vaping Use   Vaping Use: Never used  Substance and Sexual Activity   Alcohol use: Yes    Comment: occasional   Drug use: No   Sexual activity: Yes  Other Topics Concern   Not on file  Social History Narrative   Patient states that he lives alone.   States that his sister lives very close to him  (distance he describes is approx distance of one city block) distance from him.   States that he also has a brother who lives very close to him--says that he lives about the same distance from him  as the sister does.   He was smoking about one pack a day till he stopped at the time of his hospitalization 11/2013.   Drug screen was positive for cocaine at admission to hospital 11/2013. Patient reports at office visit 11/2013 he does not use cocaine on a routine basis and that he was " at a party 2 weeks ago"   Social Determinants of Health   Financial Resource Strain: Low Risk  (05/25/2022)   Overall Financial Resource Strain (CARDIA)    Difficulty of Paying Living Expenses: Not hard at all  Food Insecurity: No Food Insecurity (05/25/2022)   Hunger Vital Sign    Worried About Running Out of Food in the Last Year: Never true    [place] in the Last Year: Never true  Transportation Needs: No Transportation Needs (05/25/2022)   PRAPARE - Hydrologist (Medical): No    Lack of Transportation (Non-Medical): No  Physical Activity: Insufficiently Active (05/25/2022)   Exercise Vital Sign    Days of Exercise per Week: 3 days    Minutes of Exercise per Session: 30 min  Stress: No Stress Concern Present (05/25/2022)   [place]    Feeling of Stress : Not at all  Social Connections: Moderately Integrated (05/25/2022)   Social Connection and Isolation Panel [NHANES]    Frequency of Communication with Friends and Family: More than three times a week    Frequency of Social Gatherings with Friends and Family: More than three times a week    Attends Religious Services: 1 to 4 times per year    Active Member of [employer] or Organizations: Yes    Attends Music therapist: More than 4 times per year    Marital Status: Never married    Tobacco Counseling Ready to quit: Not Answered Counseling given: Not Answered   Clinical Intake:  Pre-visit preparation completed: Yes  Pain : No/denies pain  Diabetes: No  How often do you need to have someone help you when you read instructions,  pamphlets, or other written materials from your doctor or pharmacy?: 1 - Never  Diabetic?No   Interpreter Needed?: No  Information entered by :: Denman George LPN   Activities of Daily Living    05/25/2022    9:22 AM  In your present state of health, do you have any difficulty performing the following activities:  Hearing? 0  Vision? 1  Difficulty concentrating or making decisions? 0  Walking or climbing stairs? 0  Dressing or bathing? 0  Doing errands, shopping? 0  Preparing Food and  eating ? N  Using the Toilet? N  In the past six months, have you accidently leaked urine? N  Do you have problems with loss of bowel control? N  Managing your Medications? N  Managing your Finances? N  Housekeeping or managing your Housekeeping? N    Patient Care Team: [name], MD as PCP - General (Family Medicine) Zadie Rhine Clent Demark, MD as Consulting Physician (Ophthalmology) Bernarda Caffey, MD as Consulting Physician (Ophthalmology)  Indicate any recent Medical Services you may have received from other than [facility] providers in the past year (date may be approximate).     Assessment:   This is a routine wellness examination for [place].  Hearing/Vision screen Hearing Screening - Comments:: Denies hearing difficulties  Vision Screening - Comments:: up to date with routine eye exams with Dr. Zadie Rhine and Dr. Coralyn Pear    Dietary issues and exercise activities discussed: Current Exercise Habits: Home exercise routine, Type of exercise: walking, Time (Minutes): 30, Frequency (Times/Week): 3, Weekly Exercise (Minutes/Week): 90, Intensity: Mild   Goals Addressed   None    Depression Screen    05/25/2022    9:21 AM 04/08/2021    9:08 AM 09/05/2018    8:14 AM 05/21/2017   12:01 PM  PHQ 2/9 Scores  PHQ - 2 Score 0 0 0 0    Fall Risk    05/25/2022    9:20 AM 04/08/2021    9:13 AM 09/05/2018    8:14 AM 05/21/2017   12:01 PM  [place] in the past year? 0 0 0 No  Number falls  in past yr: 0 0    Injury with Fall? 0 0    Risk for fall due to : Impaired mobility;Impaired balance/gait Impaired balance/gait;Impaired mobility    Follow up Falls prevention discussed;Education provided;Falls evaluation completed Falls prevention discussed Falls evaluation completed     FALL RISK PREVENTION PERTAINING TO THE HOME:  Any stairs in or around the home? No  If so, are there any without handrails? No  Home free of loose throw rugs in walkways, pet beds, electrical cords, etc? Yes  Adequate lighting in your home to reduce risk of falls? Yes   ASSISTIVE DEVICES UTILIZED TO PREVENT FALLS:  Life alert? No  Use of a cane, walker or w/c? No  Grab bars in the bathroom? Yes  Shower chair or bench in shower? No  Elevated toilet seat or a handicapped toilet? Yes   TIMED UP AND GO:  Was the test performed? Yes .  Length of time to ambulate 10 feet: 7 sec.   Gait slow and steady without use of assistive device  Cognitive Function:        05/25/2022    9:23 AM 04/08/2021    9:34 AM 04/08/2021    9:15 AM  6CIT Screen  What Year? 0 points 0 points 0 points  What month? 0 points 0 points 0 points  What time? 0 points 0 points 0 points  Count back from 20 0 points 0 points 0 points  Months in reverse 0 points 4 points 4 points  Repeat phrase 0 points 4 points 4 points  Total Score 0 points 8 points 8 points    Immunizations Immunization History  Administered Date(s) Administered   Fluad Quad(high Dose 65+) 01/07/2019   Influenza-Unspecified 12/18/2021   Moderna Covid-19 Vaccine Bivalent Booster 61yr & up 01/10/2021   Moderna Sars-Covid-2 Vaccination 07/01/2019   PFIZER(Purple Top)SARS-COV-2 Vaccination 02/28/2020   Pneumococcal  Conjugate-13 05/21/2017   Pneumococcal Polysaccharide-23 01/05/2015   Zoster Recombinat (Shingrix) 04/09/2021, 06/06/2021    TDAP status: Due, Education has been provided regarding the importance of this vaccine. Advised may receive this  vaccine at local pharmacy or Health Dept. Aware to provide a copy of the vaccination record if obtained from local pharmacy or Health Dept. Verbalized acceptance and understanding.  Flu Vaccine status: Up to date  Pneumococcal vaccine status: Up to date  Covid-19 vaccine status: Information provided on how to obtain vaccines.   Qualifies for Shingles Vaccine? Yes   Zostavax completed No   Shingrix Completed?: Yes  Screening Tests Health Maintenance  Topic Date Due   DTaP/Tdap/Td (1 - Tdap) Never done   Lung Cancer Screening  Never done   COVID-19 Vaccine (4 - 2023-24 season) 11/11/2021   COLONOSCOPY (Pts 45-48yr Insurance coverage will need to be confirmed)  05/25/2023 (Originally 06/07/1995)   Medicare Annual Wellness (AWV)  05/25/2023   Pneumonia Vaccine 72 Years old  Completed   INFLUENZA VACCINE  Completed   Hepatitis C Screening  Completed   Zoster Vaccines- Shingrix  Completed   HPV VACCINES  Aged Out    Health Maintenance  Health Maintenance Due  Topic Date Due   DTaP/Tdap/Td (1 - Tdap) Never done   Lung Cancer Screening  Never done   COVID-19 Vaccine (4 - 2023-24 season) 11/11/2021    Colorectal cancer status:  Patient declines at this time  Lung Cancer Screening: (Low Dose CT Chest recommended if Age 73-80years, 30 pack-year currently smoking OR have quit w/in 15years.) does qualify.   Lung Cancer Screening Referral: will discuss further with provider at next appointment currently 1ppd  Additional Screening:  Hepatitis C Screening: does qualify; Completed 05/21/17  Vision Screening: Recommended annual ophthalmology exams for early detection of glaucoma and other disorders of the eye. Is the patient up to date with their annual eye exam?  Yes  Who is the provider or what is the name of the office in which the patient attends annual eye exams? Dr. ZCoralyn Pear If pt is not established with a provider, would they like to be referred to a provider to establish care?  No .   Dental Screening: Recommended annual dental exams for proper oral hygiene  Community Resource Referral / Chronic Care Management: CRR required this visit?  No   CCM required this visit?  No      Plan:     I have personally reviewed and noted the following in the patient's chart:   Medical and social history Use of alcohol, tobacco or illicit drugs  Current medications and supplements including opioid prescriptions. Patient is not currently taking opioid prescriptions. Functional ability and status Nutritional status Physical activity Advanced directives List of other physicians Hospitalizations, surgeries, and ER visits in previous 12 months Vitals Screenings to include cognitive, depression, and falls Referrals and appointments  In addition, I have reviewed and discussed with patient certain preventive protocols, quality metrics, and best practice recommendations. A written personalized care plan for preventive services as well as general preventive health recommendations were provided to patient.     SDenman GeorgeBWyoming [place]  [id number]  Nurse Notes: No concerns

## 2022-06-13 ENCOUNTER — Encounter (INDEPENDENT_AMBULATORY_CARE_PROVIDER_SITE_OTHER): Payer: Medicare Other | Admitting: Ophthalmology

## 2022-06-13 DIAGNOSIS — H34832 Tributary (branch) retinal vein occlusion, left eye, with macular edema: Secondary | ICD-10-CM

## 2022-06-13 DIAGNOSIS — I10 Essential (primary) hypertension: Secondary | ICD-10-CM

## 2022-06-13 DIAGNOSIS — H35033 Hypertensive retinopathy, bilateral: Secondary | ICD-10-CM

## 2022-06-13 DIAGNOSIS — H401123 Primary open-angle glaucoma, left eye, severe stage: Secondary | ICD-10-CM

## 2022-06-13 DIAGNOSIS — Z961 Presence of intraocular lens: Secondary | ICD-10-CM

## 2022-06-14 NOTE — Progress Notes (Signed)
[facility] Note  06/15/2022     CHIEF COMPLAINT Patient presents for Retina Follow Up   HISTORY OF PRESENT ILLNESS: Oscar Greene is a 72 y.o. male who presents to the clinic today for:   HPI     Retina Follow Up   Patient presents with  CRVO/BRVO.  In left eye.  This started years ago.  Duration of 5 weeks.  I, the attending physician,  performed the HPI with the patient and updated documentation appropriately.        Comments   Patient feels that the vision is about the same. He states that he can tell when it's close to coming in for a shot. He is using Brimonidine OU BID, Cosopt OU BID, and Latanoprost OU QHS.       Last edited by Bernarda Caffey, MD on 06/15/2022 11:20 AM.     Referring physician: [name], MD [address] [place],  [place] [address]  HISTORICAL INFORMATION:   Selected notes from the MEDICAL RECORD NUMBER Dr. Zadie Rhine pt LEE:  Ocular Hx- PMH-    CURRENT MEDICATIONS: Current Outpatient Medications (Ophthalmic Drugs)  Medication Sig   brimonidine (ALPHAGAN) 0.2 % ophthalmic solution 1 drop 2 (two) times daily.   dorzolamide-timolol (COSOPT) 22.3-6.8 MG/ML ophthalmic solution 1 DROP IN BOTH EYES TWICE A DAY   latanoprost (XALATAN) 0.005 % ophthalmic solution Place 1 drop into both eyes daily.   No current facility-administered medications for this visit. (Ophthalmic Drugs)   Current Outpatient Medications (Other)  Medication Sig   acetaminophen (TYLENOL) 500 MG tablet Take 1 tablet (500 mg total) by mouth every 6 (six) hours as needed.   cloNIDine (CATAPRES) 0.1 MG tablet TAKE 1 TABLET BY MOUTH TWICE A DAY   clopidogrel (PLAVIX) 75 MG tablet TAKE 1 TABLET BY MOUTH EVERY DAY   naproxen sodium (ALEVE) 220 MG tablet Take 220 mg by mouth daily as needed.   rosuvastatin (CRESTOR) 20 MG tablet TAKE 1 TABLET BY MOUTH EVERY DAY   No current facility-administered medications for this visit. (Other)    REVIEW OF SYSTEMS: ROS   Positive for: Gastrointestinal, Neurological, Eyes Negative for: Constitutional, Skin, Genitourinary, Musculoskeletal, HENT, Endocrine, Cardiovascular, Respiratory, Psychiatric, Allergic/Imm, Heme/Lymph Last edited by Annie Paras, [name] on 06/15/2022  9:48 AM.     ALLERGIES No Known Allergies  PAST MEDICAL HISTORY Past Medical History:  Diagnosis Date   AKI (acute kidney injury) 06/2015   Cataract    CVA (cerebral vascular accident)    Glaucoma    Headache(784.0)    History of traumatic head injury 03/14/1983   A pitching wedge hit him in left frontal area   Hypertension    Osteoporosis    Prostate cancer    Dr. Pilar Jarvis, Gleason 7   Thrombocytopenia    Past Surgical History:  Procedure Laterality Date   CATARACT EXTRACTION W/PHACO Bilateral 2013   Dr. Katy Fitch   L frontal craniotomy to remove blood clot     FAMILY HISTORY Family History  Problem Relation Age of Onset   Cancer Mother    Hypertension Father    SOCIAL HISTORY Social History   Tobacco Use   Smoking status: Every Day    Packs/day: 1.00    Years: 45.00    Additional pack years: 0.00    Total pack years: 45.00    Types: Cigarettes   Smokeless tobacco: Never  Vaping Use   Vaping Use: Never used  Substance Use Topics   Alcohol use: Yes    Comment: occasional   Drug use: No       OPHTHALMIC EXAM:  Base Eye Exam     Visual Acuity (Snellen - Linear)       Right Left   Dist [place] 20/40 20/50   Dist ph [place] 20/30 20/40         Tonometry (Tonopen, 9:51 AM)       Right Left   Pressure 8 11         Pupils       Dark Light Shape React APD   Right 4 3 Round Brisk None   Left 4 3 Round Brisk None         Visual Fields       Left Right    Full Full         Extraocular Movement       Right Left    Full, Ortho Full, Ortho         Neuro/Psych     Oriented x3: Yes   Mood/Affect: Normal         Dilation     Both eyes: 1.0% Mydriacyl, 2.5%  Phenylephrine @ 9:48 AM           Slit Lamp and Fundus Exam     External Exam       Right Left   External Normal Normal         Slit Lamp Exam       Right Left   Lids/Lashes Dermatochalasis Dermatochalasis - upper lid   Conjunctiva/Sclera Melanosis Melanosis   Cornea Well healed temporal cataract wound Well healed temporal cataract wound, trace Punctate epithelial erosions   Anterior Chamber Deep and quiet Deep and quiet   Iris Round and Dilated Round and Dilated   Lens Posterior chamber intraocular lens, PC IOL in good position w/ open PC PC IOL in good position w/ open PC   Anterior Vitreous Vitreous syneresis Vitreous syneresis, silicone oil micro bubbles, Posterior vitreous detachment, vitreous condensations         Fundus Exam       Right Left   Disc 3+ Pallor, Sharp rim, +cupping, Thin inferior rim mild superior hyperemia, temporal pallor, +cupping, vascular loops sup disc   C/D Ratio 0.9 0.85   Macula Flat, good foveal reflex, No heme or edema, RPE mottling good foveal reflex, trace persistent central edema/cystic changes, no heme   Vessels attenuated, Tortuous Attenuated, Copper wiring, 3+ tortuosity, +beading ST venule, sclerotic vessels ST periphery   Periphery Attached, No heme Attached, no heme, segmental PRP scars temporal periphery           IMAGING AND PROCEDURES  Imaging and Procedures for 06/15/2022  OCT, Retina - OU - Both Eyes       Right Eye Quality was good. Central Foveal Thickness: 238. Progression has been stable. Findings include normal foveal contour, no IRF, no SRF.   Left Eye Quality was poor. Central Foveal Thickness: 229. Progression has been stable. Findings include no SRF, abnormal foveal contour, intraretinal hyper-reflective material, intraretinal fluid, inner retinal atrophy (Stable improvement in central IRF/edema, residual cystic changes SN macula -- persistent).   Notes *Images captured and stored on drive  Diagnosis /  Impression:  OD: NFP, no IRF/SRF OS: Stable improvement in central IRF/edema, residual cystic changes SN macula -- persistent  Clinical management:  See below  Abbreviations: NFP - Normal foveal profile. CME - cystoid macular edema.  PED - pigment epithelial detachment. IRF - intraretinal fluid. SRF - subretinal fluid. EZ - ellipsoid zone. ERM - epiretinal membrane. ORA - outer retinal atrophy. ORT - outer retinal tubulation. SRHM - subretinal hyper-reflective material. IRHM - intraretinal hyper-reflective material      Intravitreal Injection, Pharmacologic Agent - OS - Left Eye       Time Out 06/15/2022. 10:57 AM. Confirmed correct patient, procedure, site, and patient consented.   Anesthesia Topical anesthesia was used. Anesthetic medications included Lidocaine 2%, Proparacaine 0.5%.   Procedure Preparation included 5% betadine to ocular surface, eyelid speculum. A supplied (32 g) needle was used.   Injection: 1.25 mg Bevacizumab 1.25mg /0.50ml   Route: Intravitreal, Site: Left Eye   NDC: H061816, LotAC:[id number], Expiration date: 07/24/2022   Post-op Post injection exam found visual acuity of at least counting fingers. The patient tolerated the procedure well. There were no complications. The patient received written and verbal post procedure care education. Post injection medications were not given.             ASSESSMENT/PLAN:    ICD-10-CM   1. 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 Essential hypertension  I10     3. Hypertensive retinopathy of both eyes  H35.033     4. Pseudophakia of both eyes  Z96.1     5. Primary open angle glaucoma (POAG) of left eye, severe stage  H40.1123     6. Posterior vitreous detachment of left eye  H43.812     7. Cystoid macular edema of left eye  H35.352       BRVO w/ CME, OS - Pt transferred care from Dr. Zadie Rhine due to insurance  - s/p IVA OS x19 from 06/26/19-10/26/21 -- on q6-7 wk schedule - s/p IVA OS #20 here (10.23.23, 9+wks ), #21 (12.04.23), #22 (01.24.24), #23 (02.28.24) **h/o increased IRF/edema at 7 wks on 01.24.24** - BCVA OS 20/40 -- stable - OCT OS shows Stable improvement in central IRF/edema, residual cystic changes SN macula -- persistent at 5 weeks - recommend IVA OS #24 today, 04.04.24 w/ f/u at 5-6 wks   - RBA of procedure discussed, questions answered - IVA informed consent obtained and signed, 10.23.23 (OS) - see procedure note - f/u 5-6 wks -- DFE/OCT, possible injection  2,3. Hypertensive retinopathy OU - discussed importance of tight BP control - monitor  4. Pseudophakia OU  - s/p CE/IOL OU  - IOL in good position, doing well  - monitor  5. POAG OU, severe stage  - under the expert management of Dr. Katy Fitch - IOP 08,11  - pt on brimonidine and cosopt bid OU, latanoprost qhs OU  Ophthalmic Meds Ordered this visit:  Meds ordered this encounter  Medications   Bevacizumab (AVASTIN) SOLN 1.25 mg     Return for f/u 5-6 weeks, BRVO , DFE, OCT.  There are no Patient Instructions on file for this visit.   Explained the diagnoses, plan, and follow up with the patient and they expressed understanding.  Patient expressed understanding of the importance of proper follow up care.   This document serves as a record of services personally performed by Gardiner Sleeper, MD, PhD. It was created on their behalf by San Jetty. Owens Shark, OA an ophthalmic technician. The creation of this record is  the provider's dictation and/or activities during the visit.    Electronically signed by: San Jetty. Owens Shark, OA @TODAY @ 11:21 AM   Gardiner Sleeper, M.D., Ph.D. Diseases & Surgery of the Retina and Vitreous [facility]  I have reviewed the above documentation for accuracy and completeness, and I agree with the  above. Gardiner Sleeper, M.D., Ph.D. 06/15/22 11:21 AM    Abbreviations: M myopia (nearsighted); A astigmatism; H hyperopia (farsighted); P presbyopia; Mrx spectacle prescription;  CTL contact lenses; OD right eye; OS left eye; OU both eyes  XT exotropia; ET esotropia; PEK punctate epithelial keratitis; PEE punctate epithelial erosions; DES dry eye syndrome; MGD meibomian gland dysfunction; ATs artificial tears; PFAT's preservative free artificial tears; [place] nuclear sclerotic cataract; PSC posterior subcapsular cataract; ERM epi-retinal membrane; PVD posterior vitreous detachment; RD retinal detachment; DM diabetes mellitus; DR diabetic retinopathy; NPDR non-proliferative diabetic retinopathy; PDR proliferative diabetic retinopathy; CSME clinically significant macular edema; DME diabetic macular edema; dbh dot blot hemorrhages; CWS cotton wool spot; POAG primary open angle glaucoma; C/D cup-to-disc ratio; HVF humphrey visual field; GVF goldmann visual field; OCT optical coherence tomography; IOP intraocular pressure; BRVO Branch retinal vein occlusion; CRVO central retinal vein occlusion; CRAO central retinal artery occlusion; BRAO branch retinal artery occlusion; RT retinal tear; SB scleral buckle; PPV pars plana vitrectomy; VH Vitreous hemorrhage; PRP panretinal laser photocoagulation; IVK intravitreal kenalog; VMT vitreomacular traction; MH Macular hole;  NVD neovascularization of the disc; NVE neovascularization elsewhere; AREDS age related eye disease study; ARMD age related macular degeneration; POAG primary open angle glaucoma; EBMD epithelial/anterior basement membrane dystrophy; ACIOL anterior chamber intraocular lens; IOL intraocular lens; PCIOL posterior chamber intraocular lens; Phaco/IOL phacoemulsification with intraocular lens placement; [place] photorefractive keratectomy; LASIK laser assisted in situ keratomileusis; HTN hypertension; DM diabetes mellitus; COPD chronic obstructive pulmonary disease

## 2022-06-15 ENCOUNTER — Ambulatory Visit (INDEPENDENT_AMBULATORY_CARE_PROVIDER_SITE_OTHER): Payer: Medicare Other | Admitting: Ophthalmology

## 2022-06-15 ENCOUNTER — Encounter (INDEPENDENT_AMBULATORY_CARE_PROVIDER_SITE_OTHER): Payer: Self-pay | Admitting: Ophthalmology

## 2022-06-15 DIAGNOSIS — H43812 Vitreous degeneration, left eye: Secondary | ICD-10-CM | POA: Diagnosis not present

## 2022-06-15 DIAGNOSIS — Z961 Presence of intraocular lens: Secondary | ICD-10-CM

## 2022-06-15 DIAGNOSIS — H35352 Cystoid macular degeneration, left eye: Secondary | ICD-10-CM | POA: Diagnosis not present

## 2022-06-15 DIAGNOSIS — H35033 Hypertensive retinopathy, bilateral: Secondary | ICD-10-CM

## 2022-06-15 DIAGNOSIS — H401123 Primary open-angle glaucoma, left eye, severe stage: Secondary | ICD-10-CM | POA: Diagnosis not present

## 2022-06-15 DIAGNOSIS — I10 Essential (primary) hypertension: Secondary | ICD-10-CM | POA: Diagnosis not present

## 2022-06-15 DIAGNOSIS — H34832 Tributary (branch) retinal vein occlusion, left eye, with macular edema: Secondary | ICD-10-CM | POA: Diagnosis not present

## 2022-06-15 MED ORDER — BEVACIZUMAB CHEMO INJECTION 1.25MG/0.05ML SYRINGE FOR KALEIDOSCOPE
1.2500 mg | INTRAVITREAL | Status: AC | PRN
  Administered 2022-06-15: 1.25 mg via INTRAVITREAL
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
## 2022-07-27 ENCOUNTER — Encounter (INDEPENDENT_AMBULATORY_CARE_PROVIDER_SITE_OTHER): Payer: Self-pay | Admitting: Ophthalmology

## 2022-07-27 ENCOUNTER — Ambulatory Visit (INDEPENDENT_AMBULATORY_CARE_PROVIDER_SITE_OTHER): Payer: Medicare Other | Admitting: Ophthalmology

## 2022-07-27 DIAGNOSIS — Z961 Presence of intraocular lens: Secondary | ICD-10-CM

## 2022-07-27 DIAGNOSIS — H34832 Tributary (branch) retinal vein occlusion, left eye, with macular edema: Secondary | ICD-10-CM | POA: Diagnosis not present

## 2022-07-27 DIAGNOSIS — I10 Essential (primary) hypertension: Secondary | ICD-10-CM

## 2022-07-27 DIAGNOSIS — H35033 Hypertensive retinopathy, bilateral: Secondary | ICD-10-CM

## 2022-07-27 DIAGNOSIS — H401123 Primary open-angle glaucoma, left eye, severe stage: Secondary | ICD-10-CM

## 2022-07-27 MED ORDER — BEVACIZUMAB CHEMO INJECTION 1.25MG/0.05ML SYRINGE FOR KALEIDOSCOPE
1.2500 mg | INTRAVITREAL | Status: AC | PRN
  Administered 2022-07-27: 1.25 mg via INTRAVITREAL

## 2022-08-22 DIAGNOSIS — H348322 Tributary (branch) retinal vein occlusion, left eye, stable: Secondary | ICD-10-CM | POA: Diagnosis not present

## 2022-08-22 DIAGNOSIS — Z961 Presence of intraocular lens: Secondary | ICD-10-CM | POA: Diagnosis not present

## 2022-08-22 DIAGNOSIS — H401131 Primary open-angle glaucoma, bilateral, mild stage: Secondary | ICD-10-CM | POA: Diagnosis not present
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
## 2022-08-29 ENCOUNTER — Ambulatory Visit (INDEPENDENT_AMBULATORY_CARE_PROVIDER_SITE_OTHER): Payer: Medicare Other | Admitting: Ophthalmology

## 2022-08-29 ENCOUNTER — Encounter (INDEPENDENT_AMBULATORY_CARE_PROVIDER_SITE_OTHER): Payer: Self-pay | Admitting: Ophthalmology

## 2022-08-29 DIAGNOSIS — Z961 Presence of intraocular lens: Secondary | ICD-10-CM

## 2022-08-29 DIAGNOSIS — I10 Essential (primary) hypertension: Secondary | ICD-10-CM

## 2022-08-29 DIAGNOSIS — H35033 Hypertensive retinopathy, bilateral: Secondary | ICD-10-CM

## 2022-08-29 DIAGNOSIS — H401123 Primary open-angle glaucoma, left eye, severe stage: Secondary | ICD-10-CM | POA: Diagnosis not present

## 2022-08-29 DIAGNOSIS — H34832 Tributary (branch) retinal vein occlusion, left eye, with macular edema: Secondary | ICD-10-CM | POA: Diagnosis not present

## 2022-08-29 MED ORDER — AFLIBERCEPT 2MG/0.05ML IZ SOLN FOR KALEIDOSCOPE
2.0000 mg | INTRAVITREAL | Status: AC | PRN
Start: 2022-08-29 — End: 2022-08-29
  Administered 2022-08-29: 2 mg via INTRAVITREAL

## 2022-09-12 ENCOUNTER — Ambulatory Visit (INDEPENDENT_AMBULATORY_CARE_PROVIDER_SITE_OTHER): Payer: Medicare Other | Admitting: Family Medicine

## 2022-09-12 ENCOUNTER — Encounter: Payer: Self-pay | Admitting: Family Medicine

## 2022-09-12 VITALS — BP 128/80 | HR 53 | Temp 97.6°F | Ht 69.0 in | Wt 157.2 lb

## 2022-09-12 DIAGNOSIS — I10 Essential (primary) hypertension: Secondary | ICD-10-CM | POA: Diagnosis not present

## 2022-09-12 DIAGNOSIS — I6522 Occlusion and stenosis of left carotid artery: Secondary | ICD-10-CM | POA: Diagnosis not present

## 2022-09-12 DIAGNOSIS — Z122 Encounter for screening for malignant neoplasm of respiratory organs: Secondary | ICD-10-CM

## 2022-09-12 DIAGNOSIS — Z0001 Encounter for general adult medical examination with abnormal findings: Secondary | ICD-10-CM

## 2022-09-12 DIAGNOSIS — F17200 Nicotine dependence, unspecified, uncomplicated: Secondary | ICD-10-CM

## 2022-09-12 DIAGNOSIS — Z125 Encounter for screening for malignant neoplasm of prostate: Secondary | ICD-10-CM

## 2022-09-12 DIAGNOSIS — I639 Cerebral infarction, unspecified: Secondary | ICD-10-CM

## 2022-09-12 LAB — CBC WITH DIFFERENTIAL/PLATELET
Basophils Absolute: 90 cells/uL (ref 0–200)
Eosinophils Relative: 8.2 %
HCT: 40.9 % (ref 38.5–50.0)
Lymphs Abs: 1752 cells/uL (ref 850–3900)
MCV: 99.3 fL (ref 80.0–100.0)
Monocytes Relative: 10.5 %
Platelets: 122 10*3/uL — ABNORMAL LOW (ref 140–400)
RBC: 4.12 10*6/uL — ABNORMAL LOW (ref 4.20–5.80)
RDW: 11.5 % (ref 11.0–15.0)
Total Lymphocyte: 29.2 %
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
## 2022-09-13 LAB — COMPLETE METABOLIC PANEL WITH GFR
AG Ratio: 1.7 (calc) (ref 1.0–2.5)
ALT: 18 U/L (ref 9–46)
AST: 22 U/L (ref 10–35)
Albumin: 4.2 g/dL (ref 3.6–5.1)
Alkaline phosphatase (APISO): 51 U/L (ref 35–144)
BUN/Creatinine Ratio: 29 (calc) — ABNORMAL HIGH (ref 6–22)
BUN: 29 mg/dL — ABNORMAL HIGH (ref 7–25)
CO2: 29 mmol/L (ref 20–32)
Calcium: 9.3 mg/dL (ref 8.6–10.3)
Chloride: 110 mmol/L (ref 98–110)
Creat: 1 mg/dL (ref 0.70–1.28)
Globulin: 2.5 g/dL (calc) (ref 1.9–3.7)
Glucose, Bld: 100 mg/dL — ABNORMAL HIGH (ref 65–99)
Potassium: 4.3 mmol/L (ref 3.5–5.3)
Sodium: 143 mmol/L (ref 135–146)
Total Bilirubin: 0.9 mg/dL (ref 0.2–1.2)
Total Protein: 6.7 g/dL (ref 6.1–8.1)
eGFR: 79 mL/min/{1.73_m2} (ref >=60)

## 2022-09-13 LAB — CBC WITH DIFFERENTIAL/PLATELET
Absolute Monocytes: 630 cells/uL (ref 200–950)
Basophils Relative: 1.5 %
Eosinophils Absolute: 492 cells/uL (ref 15–500)
Hemoglobin: 13.3 g/dL (ref 13.2–17.1)
MCH: 32.3 pg (ref 27.0–33.0)
MCHC: 32.5 g/dL (ref 32.0–36.0)
MPV: 12.9 fL — ABNORMAL HIGH (ref 7.5–12.5)
Neutro Abs: 3036 cells/uL (ref 1500–7800)
Neutrophils Relative %: 50.6 %
WBC: 6 10*3/uL (ref 3.8–10.8)

## 2022-09-13 LAB — LIPID PANEL
Cholesterol: 87 mg/dL (ref <200)
HDL: 43 mg/dL (ref >=40)
LDL Cholesterol (Calc): 28 mg/dL (calc)
Non-HDL Cholesterol (Calc): 44 mg/dL (calc) (ref <130)
Total CHOL/HDL Ratio: 2 (calc) (ref <5.0)
Triglycerides: 84 mg/dL (ref <150)

## 2022-09-13 LAB — PSA: PSA: 13.79 ng/mL — ABNORMAL HIGH (ref <=4.00)

## 2022-09-18 ENCOUNTER — Ambulatory Visit
Admission: RE | Admit: 2022-09-18 | Discharge: 2022-09-18 | Disposition: A | Discharge status: Home / Self Care | Payer: Medicare Other | Source: Ambulatory Visit | Attending: Family Medicine | Admitting: Family Medicine

## 2022-09-18 DIAGNOSIS — F17200 Nicotine dependence, unspecified, uncomplicated: Secondary | ICD-10-CM

## 2022-09-18 DIAGNOSIS — J439 Emphysema, unspecified: Secondary | ICD-10-CM | POA: Diagnosis not present

## 2022-09-18 DIAGNOSIS — Z122 Encounter for screening for malignant neoplasm of respiratory organs: Secondary | ICD-10-CM | POA: Diagnosis not present

## 2022-09-18 DIAGNOSIS — I700 Atherosclerosis of aorta: Secondary | ICD-10-CM | POA: Diagnosis not present

## 2022-09-18 DIAGNOSIS — Z87891 Personal history of nicotine dependence: Secondary | ICD-10-CM | POA: Diagnosis not present
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
## 2022-09-29 ENCOUNTER — Other Ambulatory Visit: Payer: Self-pay

## 2022-09-29 DIAGNOSIS — Z122 Encounter for screening for malignant neoplasm of respiratory organs: Secondary | ICD-10-CM

## 2022-10-03 ENCOUNTER — Ambulatory Visit (INDEPENDENT_AMBULATORY_CARE_PROVIDER_SITE_OTHER): Payer: Medicare Other | Admitting: Ophthalmology

## 2022-10-03 ENCOUNTER — Encounter (INDEPENDENT_AMBULATORY_CARE_PROVIDER_SITE_OTHER): Payer: Self-pay | Admitting: Ophthalmology

## 2022-10-03 DIAGNOSIS — H401123 Primary open-angle glaucoma, left eye, severe stage: Secondary | ICD-10-CM | POA: Diagnosis not present

## 2022-10-03 DIAGNOSIS — I10 Essential (primary) hypertension: Secondary | ICD-10-CM

## 2022-10-03 DIAGNOSIS — Z961 Presence of intraocular lens: Secondary | ICD-10-CM | POA: Diagnosis not present

## 2022-10-03 DIAGNOSIS — H34832 Tributary (branch) retinal vein occlusion, left eye, with macular edema: Secondary | ICD-10-CM

## 2022-10-03 DIAGNOSIS — H35033 Hypertensive retinopathy, bilateral: Secondary | ICD-10-CM | POA: Diagnosis not present

## 2022-10-03 MED ORDER — AFLIBERCEPT 2MG/0.05ML IZ SOLN FOR KALEIDOSCOPE
2.0000 mg | INTRAVITREAL | Status: AC | PRN
Start: 2022-10-03 — End: 2022-10-03
  Administered 2022-10-03: 2 mg via INTRAVITREAL
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
## 2022-11-06 ENCOUNTER — Other Ambulatory Visit: Payer: Self-pay | Admitting: Family Medicine

## 2022-11-07 ENCOUNTER — Ambulatory Visit (INDEPENDENT_AMBULATORY_CARE_PROVIDER_SITE_OTHER): Payer: Medicare Other | Admitting: Ophthalmology

## 2022-11-07 ENCOUNTER — Encounter (INDEPENDENT_AMBULATORY_CARE_PROVIDER_SITE_OTHER): Payer: Self-pay | Admitting: Ophthalmology

## 2022-11-07 DIAGNOSIS — H35033 Hypertensive retinopathy, bilateral: Secondary | ICD-10-CM

## 2022-11-07 DIAGNOSIS — Z961 Presence of intraocular lens: Secondary | ICD-10-CM

## 2022-11-07 DIAGNOSIS — H401123 Primary open-angle glaucoma, left eye, severe stage: Secondary | ICD-10-CM

## 2022-11-07 DIAGNOSIS — H34832 Tributary (branch) retinal vein occlusion, left eye, with macular edema: Secondary | ICD-10-CM

## 2022-11-07 DIAGNOSIS — I10 Essential (primary) hypertension: Secondary | ICD-10-CM | POA: Diagnosis not present

## 2022-11-07 MED ORDER — AFLIBERCEPT 2MG/0.05ML IZ SOLN FOR KALEIDOSCOPE
2.0000 mg | INTRAVITREAL | Status: AC | PRN
Start: 2022-11-07 — End: 2022-11-07
  Administered 2022-11-07: 2 mg via INTRAVITREAL

## 2022-11-07 NOTE — Telephone Encounter (Signed)
OV 09/12/22 Labs reviewed by provider Requested Prescriptions  Pending Prescriptions Disp Refills   clopidogrel (PLAVIX) 75 MG tablet [Pharmacy Med Name: CLOPIDOGREL 75 MG TABLET] 90 tablet 1    Sig: TAKE 1 TABLET BY MOUTH EVERY DAY     Hematology: Antiplatelets - clopidogrel Failed - 11/06/2022  1:20 AM      Failed - PLT in normal range and within 180 days    Platelets  Date Value Ref Range Status  09/12/2022 122 (L) 140 - 400 Thousand/uL Final         Failed - Valid encounter within last 6 months    Recent Outpatient Visits           2 years ago Benign essential HTN   [facility] Tanya Nones, Priscille Heidelberg, MD   4 years ago Benign essential HTN   [facility] Tanya Nones, Priscille Heidelberg, MD   5 years ago Encounter for hepatitis C screening test for low risk patient   [facility] Donita Brooks, MD   7 years ago Lung field abnormal finding on examination   [facility] Donita Brooks, MD   7 years ago Routine general medical examination at a health care facility   [facility] Pickard, Priscille Heidelberg, MD              Passed - HCT in normal range and within 180 days    HCT  Date Value Ref Range Status  09/12/2022 40.9 38.5 - 50.0 % Final         Passed - HGB in normal range and within 180 days    Hemoglobin  Date Value Ref Range Status  09/12/2022 13.3 13.2 - 17.1 g/dL Final         Passed - Cr in normal range and within 360 days    Creat  Date Value Ref Range Status  09/12/2022 1.00 0.70 - 1.28 mg/dL Final

## 2022-11-21 DIAGNOSIS — H401130 Primary open-angle glaucoma, bilateral, stage unspecified: Secondary | ICD-10-CM | POA: Diagnosis not present

## 2022-11-30 NOTE — Progress Notes (Signed)
[facility] - Clinic Note  12/12/2022     CHIEF COMPLAINT Patient presents for Retina Follow Up   HISTORY OF PRESENT ILLNESS: Oscar Greene is a 72 y.o. male who presents to the clinic today for:   HPI     Retina Follow Up   Patient presents with  CRVO/BRVO.  In left eye.  This started 5 weeks ago.  Duration of 5 weeks.  Since onset it is stable.  I, the attending physician,  performed the HPI with the patient and updated documentation appropriately.        Comments   5 week retina follow up BRVO OS and I'VE OS pt is reporting vision is about the same maybe little more blurred he denies any flashes or floaters       Last edited by [name], MD on 12/12/2022 11:43 AM.    Patient states that he can tell when it gets time for an injection.  Referring physician: Donita Brooks, MD [address] [place],  [place] [address]  HISTORICAL INFORMATION:   Selected notes from the MEDICAL RECORD NUMBER Dr. Luciana Axe pt LEE:  Ocular Hx- PMH-    CURRENT MEDICATIONS: Current Outpatient Medications (Ophthalmic Drugs)  Medication Sig   brimonidine (ALPHAGAN) 0.2 % ophthalmic solution 1 drop 2 (two) times daily.   dorzolamide-timolol (COSOPT) 22.3-6.8 MG/ML ophthalmic solution 1 DROP IN BOTH EYES TWICE A DAY   latanoprost (XALATAN) 0.005 % ophthalmic solution Place 1 drop into both eyes daily.   No current facility-administered medications for this visit. (Ophthalmic Drugs)   Current Outpatient Medications (Other)  Medication Sig   acetaminophen (TYLENOL) 500 MG tablet Take 1 tablet (500 mg total) by mouth every 6 (six) hours as needed.   cloNIDine (CATAPRES) 0.1 MG tablet TAKE 1 TABLET BY MOUTH TWICE A DAY   clopidogrel (PLAVIX) 75 MG tablet TAKE 1 TABLET BY MOUTH EVERY DAY   naproxen sodium (ALEVE) 220 MG tablet Take 220 mg by mouth daily as needed.   rosuvastatin (CRESTOR) 20 MG tablet TAKE 1 TABLET BY MOUTH EVERY DAY   No current  facility-administered medications for this visit. (Other)   REVIEW OF SYSTEMS: ROS   Positive for: Gastrointestinal, Neurological, Eyes Negative for: Constitutional, Skin, Genitourinary, Musculoskeletal, HENT, Endocrine, Cardiovascular, Respiratory, Psychiatric, Allergic/Imm, Heme/Lymph Last edited by Etheleen Mayhew, [name] on 12/12/2022 10:02 AM.     ALLERGIES No Known Allergies  PAST MEDICAL HISTORY Past Medical History:  Diagnosis Date   AKI (acute kidney injury) (HCC) 06/2015   Asymptomatic carotid artery stenosis, right    Cataract    CVA (cerebral vascular accident) (HCC)    Glaucoma    Headache(784.0)    History of traumatic head injury 03/14/1983   A pitching wedge hit him in left frontal area   Hypertension    Osteoporosis    Prostate cancer (HCC)    Dr. Sherryl Barters, [place] 7   Thrombocytopenia [facility])    Past Surgical History:  Procedure Laterality Date   CATARACT EXTRACTION W/PHACO Bilateral 2013   Dr. Dione Booze   L frontal craniotomy to remove blood clot     FAMILY HISTORY Family History  Problem Relation Age of Onset   Cancer Mother    Hypertension Father    SOCIAL HISTORY Social History   Tobacco Use   Smoking status: Every Day    Current packs/day: 1.00    Average packs/day: 1 pack/day for 45.0 years (45.0 ttl pk-yrs)  Types: Cigarettes   Smokeless tobacco: Never  Vaping Use   Vaping status: Never Used  Substance Use Topics   Alcohol use: Yes    Comment: occasional   Drug use: No       OPHTHALMIC EXAM:  Base Eye Exam     Visual Acuity (Snellen - Linear)       Right Left   Dist [place] 20/30 -1 20/40 -2   Dist ph [place] 20/25 -2 20/30 -2         Tonometry (Tonopen, 10:10 AM)       Right Left   Pressure 8 9         Pupils       Pupils Dark Light Shape React APD   Right PERRL 4 3 Round Brisk None   Left PERRL 4 3 Round Brisk None         Visual Fields       Left Right    Full Full         Extraocular Movement        Right Left    Full, Ortho Full, Ortho         Neuro/Psych     Oriented x3: Yes   Mood/Affect: Normal         Dilation     Both eyes: 2.5% Phenylephrine @ 10:10 AM           Slit Lamp and Fundus Exam     External Exam       Right Left   External Normal Normal         Slit Lamp Exam       Right Left   Lids/Lashes Dermatochalasis Dermatochalasis - upper lid   Conjunctiva/Sclera Melanosis Melanosis   Cornea Well healed temporal cataract wound Well healed temporal cataract wound, trace Punctate epithelial erosions   Anterior Chamber Deep and quiet Deep and quiet   Iris Round and Dilated Round and Dilated   Lens Posterior chamber intraocular lens, PC IOL in good position w/ open PC PC IOL in good position w/ open PC   Anterior Vitreous Vitreous syneresis Vitreous syneresis, silicone oil micro bubbles, Posterior vitreous detachment, vitreous condensations         Fundus Exam       Right Left   Disc 3+ Pallor, Sharp rim, +cupping, Thin inferior rim mild superior hyperemia, temporal pallor, +cupping, vascular loops sup disc   C/D Ratio 0.9 0.85   Macula Flat, good foveal reflex, No heme or edema, RPE mottling good foveal reflex, stable improvement in central edema/cystic changes, no heme   Vessels attenuated, Tortuous Attenuated, Copper wiring, 3+ tortuosity, +beading ST venule, sclerotic vessels ST periphery   Periphery Attached, No heme Attached, no heme, segmental PRP scars temporal periphery           IMAGING AND PROCEDURES  Imaging and Procedures for 12/12/2022  OCT, Retina - OU - Both Eyes       Right Eye Quality was good. Central Foveal Thickness: 235. Progression has been stable. Findings include normal foveal contour, no IRF, no SRF.   Left Eye Quality was good. Central Foveal Thickness: 224. Progression has been stable. Findings include no IRF, no SRF, abnormal foveal contour, intraretinal hyper-reflective material, inner retinal atrophy (Stable  improvement in focal IRF/cystic changes SN macula and fovea).   Notes *Images captured and stored on drive  Diagnosis / Impression:  OD: NFP, no IRF/SRF OS: stable improvement in focal IRF/cystic changes SN macula and  fovea  Clinical management:  See below  Abbreviations: NFP - Normal foveal profile. CME - cystoid macular edema. PED - pigment epithelial detachment. IRF - intraretinal fluid. SRF - subretinal fluid. EZ - ellipsoid zone. ERM - epiretinal membrane. ORA - outer retinal atrophy. ORT - outer retinal tubulation. SRHM - subretinal hyper-reflective material. IRHM - intraretinal hyper-reflective material      Intravitreal Injection, Pharmacologic Agent - OS - Left Eye       Time Out 12/12/2022. 11:01 AM. Confirmed correct patient, procedure, site, and patient consented.   Anesthesia Topical anesthesia was used. Anesthetic medications included Lidocaine 2%, Proparacaine 0.5%.   Procedure Preparation included 5% betadine to ocular surface, eyelid speculum. A (32 g) needle was used.   Injection: 2 mg aflibercept 2 MG/0.05ML   Route: Intravitreal, Site: Left Eye   NDC: L6038910, Lot: [id number], Expiration date: 01/11/2024, Waste: 0 mL   Post-op Post injection exam found visual acuity of at least counting fingers. The patient tolerated the procedure well. There were no complications. The patient received written and verbal post procedure care education. Post injection medications were not given.            ASSESSMENT/PLAN:    ICD-10-CM   1. 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 Essential hypertension  I10     3. Hypertensive retinopathy of both eyes  H35.033     4. Pseudophakia of both eyes  Z96.1     5. Primary open angle glaucoma (POAG) of left eye, severe stage  H40.1123      BRVO w/ CME,  OS - Pt transferred care from Dr. Luciana Axe due to insurance  - s/p IVA OS x19 from 06/26/19-10/26/21 -- on q6-7 wk schedule - s/p IVA OS #20 here (10.23.23, 9+wks ), #21 (12.04.23), #22 (01.24.24), #23 (02.28.24), #24 (04.04.24), #25 (5.16.24) -- IVA resistance - s/p [name] #1 (06.18.24), #2 (07.23.24), #3 (08.27.24) **h/o increased IRF/edema at 7 wks on 01.24.24 and 6 wks on 05.16.24 [w/ IVA]** - BCVA OS 20/30 from 20/40 - OCT OS shows stable improvement in focal IRF/cystic changes SN macula and fovea at 5 weeks - recommend [name] #4 today, 10.01.24 w/ f/u ext to 6 wks - RBA of procedure discussed, questions answered - IVE informed consent obtained and signed, 06.18.24 (OS) - see procedure note - f/u 6 wks -- DFE/OCT, possible injection  2,3. Hypertensive retinopathy OU - discussed importance of tight BP control - monitor  4. Pseudophakia OU  - s/p CE/IOL OU  - IOL in good position, doing well  - monitor  5. POAG OU, severe stage  - under the expert management of Dr. Dione Booze - IOP 8,9  - pt on Brimonidine and Cosopt bid OU, Latanoprost qhs OU  Ophthalmic Meds Ordered this visit:  Meds ordered this encounter  Medications   aflibercept (EYLEA) SOLN 2 mg     Return in about 6 weeks (around 01/23/2023) for f/u BRVO OS, DFE, OCT, Possible, IVE, OS.  There are no Patient Instructions on file for this visit.   Explained the diagnoses, plan, and follow up with the patient and they expressed understanding.  Patient expressed understanding of the importance of proper follow up care.   This document serves as a record of services personally performed by Isaias Cowman.  Vanessa Barbara, MD, PhD. It was created on their behalf by Laurey Morale, [name] an ophthalmic technician. The creation of this record is the provider's dictation and/or activities during the visit.    Electronically signed by:  Charlette Caffey, [name]  12/12/22 11:44 AM  Karie Chimera, M.D., Ph.D. Diseases & Surgery of the Retina  and Vitreous [facility]  I have reviewed the above documentation for accuracy and completeness, and I agree with the above. Karie Chimera, M.D., Ph.D. 12/12/22 11:45 AM  Abbreviations: M myopia (nearsighted); A astigmatism; H hyperopia (farsighted); P presbyopia; Mrx spectacle prescription;  CTL contact lenses; OD right eye; OS left eye; OU both eyes  XT exotropia; ET esotropia; PEK punctate epithelial keratitis; PEE punctate epithelial erosions; DES dry eye syndrome; MGD meibomian gland dysfunction; ATs artificial tears; PFAT's preservative free artificial tears; NSC nuclear sclerotic cataract; PSC posterior subcapsular cataract; ERM epi-retinal membrane; PVD posterior vitreous detachment; RD retinal detachment; DM diabetes mellitus; DR diabetic retinopathy; NPDR non-proliferative diabetic retinopathy; PDR proliferative diabetic retinopathy; CSME clinically significant macular edema; DME diabetic macular edema; dbh dot blot hemorrhages; CWS cotton wool spot; POAG primary open angle glaucoma; C/D cup-to-disc ratio; HVF humphrey visual field; GVF goldmann visual field; OCT optical coherence tomography; IOP intraocular pressure; BRVO Branch retinal vein occlusion; CRVO central retinal vein occlusion; CRAO central retinal artery occlusion; BRAO branch retinal artery occlusion; RT retinal tear; SB scleral buckle; PPV pars plana vitrectomy; VH Vitreous hemorrhage; PRP panretinal laser photocoagulation; IVK intravitreal kenalog; VMT vitreomacular traction; MH Macular hole;  NVD neovascularization of the disc; NVE neovascularization elsewhere; AREDS age related eye disease study; ARMD age related macular degeneration; POAG primary open angle glaucoma; EBMD epithelial/anterior basement membrane dystrophy; ACIOL anterior chamber intraocular lens; IOL intraocular lens; PCIOL posterior chamber intraocular lens; Phaco/IOL phacoemulsification with intraocular lens placement; PRK photorefractive  keratectomy; LASIK laser assisted in situ keratomileusis; HTN hypertension; DM diabetes mellitus; COPD chronic obstructive pulmonary disease

## 2022-12-12 ENCOUNTER — Ambulatory Visit (INDEPENDENT_AMBULATORY_CARE_PROVIDER_SITE_OTHER): Payer: Medicare Other

## 2022-12-12 ENCOUNTER — Encounter (INDEPENDENT_AMBULATORY_CARE_PROVIDER_SITE_OTHER): Payer: Self-pay | Admitting: Ophthalmology

## 2022-12-12 ENCOUNTER — Ambulatory Visit (INDEPENDENT_AMBULATORY_CARE_PROVIDER_SITE_OTHER): Payer: Medicare Other | Admitting: Ophthalmology

## 2022-12-12 DIAGNOSIS — H34832 Tributary (branch) retinal vein occlusion, left eye, with macular edema: Secondary | ICD-10-CM

## 2022-12-12 DIAGNOSIS — I10 Essential (primary) hypertension: Secondary | ICD-10-CM

## 2022-12-12 DIAGNOSIS — Z961 Presence of intraocular lens: Secondary | ICD-10-CM

## 2022-12-12 DIAGNOSIS — Z23 Encounter for immunization: Secondary | ICD-10-CM | POA: Diagnosis not present

## 2022-12-12 DIAGNOSIS — H401123 Primary open-angle glaucoma, left eye, severe stage: Secondary | ICD-10-CM | POA: Diagnosis not present

## 2022-12-12 DIAGNOSIS — H35033 Hypertensive retinopathy, bilateral: Secondary | ICD-10-CM

## 2022-12-12 MED ORDER — AFLIBERCEPT 2MG/0.05ML IZ SOLN FOR KALEIDOSCOPE
2.0000 mg | INTRAVITREAL | Status: AC | PRN
Start: 2022-12-12 — End: 2022-12-12
  Administered 2022-12-12: 2 mg via INTRAVITREAL

## 2023-01-09 NOTE — Progress Notes (Signed)
[facility] - Clinic Note  01/23/2023     CHIEF COMPLAINT Patient presents for Retina Follow Up   HISTORY OF PRESENT ILLNESS: Oscar Greene is a 72 y.o. male who presents to the clinic today for:   HPI     Retina Follow Up   Patient presents with  CRVO/BRVO.  In left eye.  This started 6 weeks ago.  Duration of 6 weeks.  Since onset it is stable.  I, the attending physician,  performed the HPI with the patient and updated documentation appropriately.        Comments   6 week retina follow up BRVO OS and [name] pt is reporting no vision changes noticed he denies any flashes or floaters pt is using Brimonidine and Cosopt bid OU, Latanoprost qhs OU      Last edited by [name], MD on 01/23/2023 12:04 PM.     Patient states vision is stable  Referring physician: Donita Brooks, MD [address] [place],  [place] [address]  HISTORICAL INFORMATION:   Selected notes from the MEDICAL RECORD NUMBER Dr. Luciana Axe pt LEE:  Ocular Hx- PMH-    CURRENT MEDICATIONS: Current Outpatient Medications (Ophthalmic Drugs)  Medication Sig   brimonidine (ALPHAGAN) 0.2 % ophthalmic solution 1 drop 2 (two) times daily.   dorzolamide-timolol (COSOPT) 22.3-6.8 MG/ML ophthalmic solution 1 DROP IN BOTH EYES TWICE A DAY   latanoprost (XALATAN) 0.005 % ophthalmic solution Place 1 drop into both eyes daily.   No current facility-administered medications for this visit. (Ophthalmic Drugs)   Current Outpatient Medications (Other)  Medication Sig   acetaminophen (TYLENOL) 500 MG tablet Take 1 tablet (500 mg total) by mouth every 6 (six) hours as needed.   cloNIDine (CATAPRES) 0.1 MG tablet TAKE 1 TABLET BY MOUTH TWICE A DAY   clopidogrel (PLAVIX) 75 MG tablet TAKE 1 TABLET BY MOUTH EVERY DAY   naproxen sodium (ALEVE) 220 MG tablet Take 220 mg by mouth daily as needed.   rosuvastatin (CRESTOR) 20 MG tablet TAKE 1 TABLET BY MOUTH EVERY DAY   No current  facility-administered medications for this visit. (Other)   REVIEW OF SYSTEMS: ROS   Positive for: Gastrointestinal, Neurological, Eyes Negative for: Constitutional, Skin, Genitourinary, Musculoskeletal, HENT, Endocrine, Cardiovascular, Respiratory, Psychiatric, Allergic/Imm, Heme/Lymph Last edited by Etheleen Mayhew, [name] on 01/23/2023  9:35 AM.      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 Dione Booze   L frontal craniotomy to remove blood clot     FAMILY HISTORY Family History  Problem Relation Age of Onset   Cancer Mother    Hypertension Father    SOCIAL HISTORY Social History   Tobacco Use   Smoking status: Every Day    Current packs/day: 1.00    Average packs/day: 1 pack/day for 45.0 years (45.0 ttl pk-yrs)  Types: Cigarettes   Smokeless tobacco: Never  Vaping Use   Vaping status: Never Used  Substance Use Topics   Alcohol use: Yes    Comment: occasional   Drug use: No       OPHTHALMIC EXAM:  Base Eye Exam     Visual Acuity (Snellen - Linear)       Right Left   Dist [place] 20/40 20/40   Dist ph [place] 20/30 20/30         Tonometry (Tonopen, 9:45 AM)       Right Left   Pressure 5 12         Pupils       Pupils Dark Light Shape React APD   Right PERRL 4 3 Round Brisk None   Left PERRL 4 3 Round Brisk None         Visual Fields       Left Right    Full Full         Extraocular Movement       Right Left     Full, Ortho Full, Ortho         Neuro/Psych     Oriented x3: Yes   Mood/Affect: Normal         Dilation     Both eyes: 2.5% Phenylephrine @ 9:45 AM           Slit Lamp and Fundus Exam     External Exam       Right Left   External Normal Normal         Slit Lamp Exam       Right Left   Lids/Lashes Dermatochalasis Dermatochalasis - upper lid   Conjunctiva/Sclera Melanosis Melanosis   Cornea Well healed temporal cataract wound Well healed temporal cataract wound, trace Punctate epithelial erosions   Anterior Chamber Deep and quiet Deep and quiet   Iris Round and Dilated Round and Dilated   Lens Posterior chamber intraocular lens, PC IOL in good position w/ open PC PC IOL in good position w/ open PC   Anterior Vitreous Vitreous syneresis Vitreous syneresis, silicone oil micro bubbles, Posterior vitreous detachment, vitreous condensations         Fundus Exam       Right Left   Disc 3+ Pallor, Sharp rim, +cupping, Thin inferior rim mild superior hyperemia, temporal pallor, +cupping, vascular loops sup disc   C/D Ratio 0.9 0.85   Macula Flat, good foveal reflex, No heme or edema, RPE mottling good foveal reflex, stable improvement in central edema/cystic changes, no heme   Vessels attenuated, Tortuous Attenuated, Copper wiring, 3+ tortuosity, +beading ST venule, sclerotic vessels ST periphery   Periphery Attached, No heme Attached, no heme, segmental PRP scars temporal periphery           IMAGING AND PROCEDURES  Imaging and Procedures for 01/23/2023  OCT, Retina - OU - Both Eyes       Right Eye Quality was good. Central Foveal Thickness: 239. Progression has been stable. Findings include normal foveal contour, no IRF, no SRF.   Left Eye Quality was good. Central Foveal Thickness: 222. Progression has been stable. Findings include no IRF, no SRF, abnormal foveal contour, intraretinal hyper-reflective material, inner retinal atrophy (Stable improvement in  focal IRF/cystic changes SN macula and fovea).   Notes *Images captured and stored on drive  Diagnosis / Impression:  OD: NFP, no IRF/SRF OS: stable improvement in focal IRF/cystic changes SN macula and fovea  Clinical management:  See below  Abbreviations: NFP - Normal foveal profile. CME - cystoid macular edema. PED - pigment epithelial detachment. IRF - intraretinal fluid. SRF - subretinal fluid. EZ - ellipsoid zone. ERM - epiretinal membrane. ORA - outer retinal atrophy. ORT - outer retinal tubulation. SRHM - subretinal hyper-reflective material. IRHM - intraretinal hyper-reflective material      Intravitreal Injection, Pharmacologic Agent - OS - Left Eye       Time Out 01/23/2023. 10:08 AM. Confirmed correct patient, procedure, site, and patient consented.   Anesthesia Topical anesthesia was used. Anesthetic medications included Lidocaine 2%, Proparacaine 0.5%.   Procedure Preparation included 5% betadine to ocular surface, eyelid speculum. A (32 g) needle was used.   Injection: 2 mg aflibercept 2 MG/0.05ML   Route: Intravitreal, Site: Left Eye   NDC: [id number], Lot: [id number], Expiration date: 03/12/2024, Waste: 0 mL   Post-op Post injection exam found visual acuity of at least counting fingers. The patient tolerated the procedure well. There were no complications. The patient received written and verbal post procedure care education. Post injection medications were not given.             ASSESSMENT/PLAN:    ICD-10-CM   1. 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 Essential hypertension  I10     3. Hypertensive retinopathy of both eyes  H35.033     4. Pseudophakia of both eyes  Z96.1     5. Primary open angle glaucoma (POAG) of left eye, severe stage  H40.1123       BRVO w/ CME, OS - Pt  transferred care from Dr. Luciana Axe due to insurance  - s/p IVA OS x19 from 06/26/19-10/26/21 -- on q6-7 wk schedule - s/p IVA OS #20 here (10.23.23, 9+wks ), #21 (12.04.23), #22 (01.24.24), #23 (02.28.24), #24 (04.04.24), #25 (5.16.24) -- IVA resistance - s/p [name] #1 (06.18.24), #2 (07.23.24), #3 (08.27.24), #4 (10.01.24) **h/o increased IRF/edema at 7 wks on 01.24.24 and 6 wks on 05.16.24 [w/ IVA]** - BCVA OS 20/30 - stable - OCT OS shows stable improvement in focal IRF/cystic changes SN macula and fovea at 6 weeks - recommend [name] #5 today, 11.12.24 w/ f/u ext to 7 wks - RBA of procedure discussed, questions answered - IVE informed consent obtained and signed, 06.18.24 (OS) - see procedure note - f/u 7 wks -- DFE/OCT, possible injection  2,3. Hypertensive retinopathy OU - discussed importance of tight BP control - monitor  4. Pseudophakia OU  - s/p CE/IOL OU  - IOL in good position, doing well  - monitor  5. POAG OU, severe stage  - under the expert management of Dr. Dione Booze - IOP 5,12  - pt on Brimonidine and Cosopt bid OU, Latanoprost qhs OU  Ophthalmic Meds Ordered this visit:  Meds ordered this encounter  Medications   aflibercept (EYLEA) SOLN 2 mg     Return in about 7 weeks (around 03/13/2023) for f/u BRVO OS, DFE, OCT.  There are no Patient Instructions on file for this visit.   Explained the diagnoses, plan, and follow up with the patient and they expressed understanding.  Patient expressed understanding of the importance of proper follow up care.   This document serves as a record of services personally performed by Karie Chimera, MD, PhD. It  was created on their behalf by Laurey Morale, [name] an ophthalmic technician. The creation of this record is the provider's dictation and/or activities during the visit.    Electronically signed by:  Charlette Caffey, [name]  01/23/23 12:07 PM  This document serves as a record of services personally performed by Karie Chimera, MD, PhD. It was created on their behalf by Glee Arvin. Manson Passey, OA an ophthalmic technician. The creation of this record is the provider's dictation and/or activities during the visit.    Electronically signed by: Glee Arvin. Manson Passey, OA 01/23/23 12:07 PM  Karie Chimera, M.D., Ph.D. Diseases & Surgery of the Retina and Vitreous [facility]  I have reviewed the above documentation for accuracy and completeness, and I agree with the above. Karie Chimera, M.D., Ph.D. 01/23/23 12:08 PM  Abbreviations: M myopia (nearsighted); A astigmatism; H hyperopia (farsighted); P presbyopia; Mrx spectacle prescription;  CTL contact lenses; OD right eye; OS left eye; OU both eyes  XT exotropia; ET esotropia; PEK punctate epithelial keratitis; PEE punctate epithelial erosions; DES dry eye syndrome; MGD meibomian gland dysfunction; ATs artificial tears; PFAT's preservative free artificial tears; NSC nuclear sclerotic cataract; PSC posterior subcapsular cataract; ERM epi-retinal membrane; PVD posterior vitreous detachment; RD retinal detachment; DM diabetes mellitus; DR diabetic retinopathy; NPDR non-proliferative diabetic retinopathy; PDR proliferative diabetic retinopathy; CSME clinically significant macular edema; DME diabetic macular edema; dbh dot blot hemorrhages; CWS cotton wool spot; POAG primary open angle glaucoma; C/D cup-to-disc ratio; HVF humphrey visual field; GVF goldmann visual field; OCT optical coherence tomography; IOP intraocular pressure; BRVO Branch retinal vein occlusion; CRVO central retinal vein occlusion; CRAO central retinal artery occlusion; BRAO branch retinal artery occlusion; RT retinal tear; SB scleral buckle; PPV pars plana vitrectomy; VH Vitreous hemorrhage; PRP panretinal laser photocoagulation; IVK intravitreal kenalog; VMT vitreomacular traction; MH Macular hole;  NVD neovascularization of the disc; NVE neovascularization elsewhere; AREDS age related eye disease  study; ARMD age related macular degeneration; POAG primary open angle glaucoma; EBMD epithelial/anterior basement membrane dystrophy; ACIOL anterior chamber intraocular lens; IOL intraocular lens; PCIOL posterior chamber intraocular lens; Phaco/IOL phacoemulsification with intraocular lens placement; PRK photorefractive keratectomy; LASIK laser assisted in situ keratomileusis; HTN hypertension; DM diabetes mellitus; COPD chronic obstructive pulmonary disease

## 2023-01-23 ENCOUNTER — Ambulatory Visit (INDEPENDENT_AMBULATORY_CARE_PROVIDER_SITE_OTHER): Payer: Medicare Other | Admitting: Ophthalmology

## 2023-01-23 ENCOUNTER — Encounter (INDEPENDENT_AMBULATORY_CARE_PROVIDER_SITE_OTHER): Payer: Self-pay | Admitting: Ophthalmology

## 2023-01-23 DIAGNOSIS — Z961 Presence of intraocular lens: Secondary | ICD-10-CM

## 2023-01-23 DIAGNOSIS — I10 Essential (primary) hypertension: Secondary | ICD-10-CM

## 2023-01-23 DIAGNOSIS — H401123 Primary open-angle glaucoma, left eye, severe stage: Secondary | ICD-10-CM | POA: Diagnosis not present

## 2023-01-23 DIAGNOSIS — H34832 Tributary (branch) retinal vein occlusion, left eye, with macular edema: Secondary | ICD-10-CM

## 2023-01-23 DIAGNOSIS — H35033 Hypertensive retinopathy, bilateral: Secondary | ICD-10-CM

## 2023-01-23 MED ORDER — AFLIBERCEPT 2MG/0.05ML IZ SOLN FOR KALEIDOSCOPE
2.0000 mg | INTRAVITREAL | Status: AC | PRN
  Administered 2023-01-23: 2 mg via INTRAVITREAL
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
## 2023-03-13 ENCOUNTER — Encounter (INDEPENDENT_AMBULATORY_CARE_PROVIDER_SITE_OTHER): Payer: Self-pay | Admitting: Ophthalmology

## 2023-03-13 ENCOUNTER — Ambulatory Visit (INDEPENDENT_AMBULATORY_CARE_PROVIDER_SITE_OTHER): Payer: Medicare Other | Admitting: Ophthalmology

## 2023-03-13 DIAGNOSIS — H35033 Hypertensive retinopathy, bilateral: Secondary | ICD-10-CM | POA: Diagnosis not present

## 2023-03-13 DIAGNOSIS — I10 Essential (primary) hypertension: Secondary | ICD-10-CM | POA: Diagnosis not present

## 2023-03-13 DIAGNOSIS — H401123 Primary open-angle glaucoma, left eye, severe stage: Secondary | ICD-10-CM

## 2023-03-13 DIAGNOSIS — Z961 Presence of intraocular lens: Secondary | ICD-10-CM | POA: Diagnosis not present

## 2023-03-13 DIAGNOSIS — H34832 Tributary (branch) retinal vein occlusion, left eye, with macular edema: Secondary | ICD-10-CM | POA: Diagnosis not present

## 2023-03-13 MED ORDER — AFLIBERCEPT 2MG/0.05ML IZ SOLN FOR KALEIDOSCOPE
2.0000 mg | INTRAVITREAL | Status: AC | PRN
  Administered 2023-03-13: 2 mg via INTRAVITREAL

## 2023-04-06 ENCOUNTER — Telehealth: Payer: Self-pay

## 2023-04-06 NOTE — Telephone Encounter (Signed)
LVM to call and schedule 6 month F/U scan and SDMV, she will now begin our program based on note in referral.
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
## 2023-05-01 ENCOUNTER — Ambulatory Visit (INDEPENDENT_AMBULATORY_CARE_PROVIDER_SITE_OTHER): Payer: Medicare Other | Admitting: Ophthalmology

## 2023-05-01 ENCOUNTER — Encounter (INDEPENDENT_AMBULATORY_CARE_PROVIDER_SITE_OTHER): Payer: Self-pay | Admitting: Ophthalmology

## 2023-05-01 DIAGNOSIS — H35033 Hypertensive retinopathy, bilateral: Secondary | ICD-10-CM | POA: Diagnosis not present

## 2023-05-01 DIAGNOSIS — I10 Essential (primary) hypertension: Secondary | ICD-10-CM | POA: Diagnosis not present

## 2023-05-01 DIAGNOSIS — H34832 Tributary (branch) retinal vein occlusion, left eye, with macular edema: Secondary | ICD-10-CM | POA: Diagnosis not present

## 2023-05-01 DIAGNOSIS — H401123 Primary open-angle glaucoma, left eye, severe stage: Secondary | ICD-10-CM | POA: Diagnosis not present

## 2023-05-01 DIAGNOSIS — Z961 Presence of intraocular lens: Secondary | ICD-10-CM

## 2023-05-01 MED ORDER — AFLIBERCEPT 2MG/0.05ML IZ SOLN FOR KALEIDOSCOPE
2.0000 mg | INTRAVITREAL | Status: AC | PRN
  Administered 2023-05-01: 2 mg via INTRAVITREAL

## 2023-05-02 ENCOUNTER — Other Ambulatory Visit: Payer: Self-pay

## 2023-05-02 ENCOUNTER — Telehealth: Payer: Self-pay | Admitting: Family Medicine

## 2023-05-02 MED ORDER — CLOPIDOGREL BISULFATE 75 MG PO TABS: 75.0000 mg | ORAL_TABLET | Freq: Every day | ORAL | 1 refills | Status: DC

## 2023-05-02 NOTE — Telephone Encounter (Signed)
 Prescription Request  05/02/2023  LOV: 09/12/2022  What is the name of the medication or equipment?   clopidogrel (PLAVIX) 75 MG tablet   Have you contacted your pharmacy to request a refill? Yes   Which pharmacy would you like this sent to?  [id number] [place], [place] - [address] [place] [address] Phone: [phone] Fax: [phone]    Patient notified that their request is being sent to the clinical staff for review and that they should receive a response within 2 business days.   Please advise pharmacist.

## 2023-05-04 ENCOUNTER — Telehealth: Payer: Self-pay

## 2023-05-04 DIAGNOSIS — F17210 Nicotine dependence, cigarettes, uncomplicated: Secondary | ICD-10-CM

## 2023-05-04 DIAGNOSIS — Z122 Encounter for screening for malignant neoplasm of respiratory organs: Secondary | ICD-10-CM

## 2023-05-04 DIAGNOSIS — Z87891 Personal history of nicotine dependence: Secondary | ICD-10-CM

## 2023-05-04 DIAGNOSIS — R911 Solitary pulmonary nodule: Secondary | ICD-10-CM

## 2023-05-04 NOTE — Telephone Encounter (Signed)
.  Lung Cancer Screening Narrative/Criteria Questionnaire (Cigarette Smokers Only- No Cigars/Pipes/vapes)   Oscar Greene   SDMV:05/29/2023 at 3:00         12-03-1950   LDCT: 06/01/2023 at [facility] at 3:40pm     73 y.o.   Phone: [phone]  Lung Screening Narrative (confirm age 29-77 yrs Medicare / 50-80 yrs Private pay insurance)   Insurance information:[employer]   Referring Provider:[place]   This screening involves an initial phone call with a team member from our program. It is called a shared decision making visit. The initial meeting is required by  insurance and Medicare to make sure you understand the program. This appointment takes about 15-20 minutes to complete. You will complete the screening scan at your scheduled date/time.  This scan takes about 5-10 minutes to complete. You can eat and drink normally before and after the scan.  Criteria questions for Lung Cancer Screening:   Are you a current or former smoker? Current Age began smoking: 89   If you are a former smoker, what year did you quit smoking? (within 15 yrs)   To calculate your smoking history, I need an accurate estimate of how many packs of cigarettes you smoked per day and for how many years. (Not just the number of PPD you are now smoking)   Years smoking 57 x Packs per day 1 = Pack years 57   (at least 20 pack yrs)   (Make sure they understand that we need to know how much they have smoked in the past, not just the number of PPD they are smoking now)  Do you have a personal history of cancer?  No    Do you have a family history of cancer? Yes  (cancer type and and relative) Aunt Lung   Are you coughing up blood?  No  Have you had unexplained weight loss of 15 lbs or more in the last 6 months? No  It looks like you meet all criteria.  When would be a good time for [place] to schedule you for this screening?   Additional information: N/A

## 2023-05-23 DIAGNOSIS — H40023 Open angle with borderline findings, high risk, bilateral: Secondary | ICD-10-CM | POA: Diagnosis not present

## 2023-05-29 ENCOUNTER — Ambulatory Visit (INDEPENDENT_AMBULATORY_CARE_PROVIDER_SITE_OTHER): Payer: Medicare Other | Admitting: Acute Care

## 2023-05-29 DIAGNOSIS — F17210 Nicotine dependence, cigarettes, uncomplicated: Secondary | ICD-10-CM | POA: Diagnosis not present

## 2023-05-29 DIAGNOSIS — Z122 Encounter for screening for malignant neoplasm of respiratory organs: Secondary | ICD-10-CM

## 2023-05-29 NOTE — Patient Instructions (Signed)

## 2023-05-29 NOTE — Progress Notes (Signed)
 Provider Attestation I agree with the documentation of the Shared Decision Making visit,  smoking cessation counseling if appropriate, and verification or eligibility for lung cancer screening as documented by the RN Nurse Navigator.   Raejean Bullock, MSN, AGACNP-BC [facility] See Amion for personal pager PCCM on call pager [phone]     Virtual Visit via Video Note  I connected with Marcille Severance on 05/29/23 at  3:00 PM EDT by a video enabled telemedicine application and verified that I am speaking with the correct person using two identifiers.  Location: Patient: in home Provider: [address] [place], [place], [address]   Shared Decision Making Visit Lung Cancer Screening Program [phone])   Eligibility: Age 73 y.o. Pack Years Smoking History Calculation 42 (# packs/per year x # years smoked) Recent History of coughing up blood  no Unexplained weight loss? no ( >Than 15 pounds within the last 6 months ) Prior History Lung / other cancer no (Diagnosis within the last 5 years already requiring surveillance chest CT Scans). Smoking Status Current Smoker Former Smokers: Years since quit:  NA  Quit Date: NA  Visit Components: Discussion included one or more decision making aids. yes Discussion included risk/benefits of screening. yes Discussion included potential follow up diagnostic testing for abnormal scans. yes Discussion included meaning and risk of over diagnosis. yes Discussion included meaning and risk of False Positives. yes Discussion included meaning of total radiation exposure. yes  Counseling Included: Importance of adherence to annual lung cancer LDCT screening. yes Impact of comorbidities on ability to participate in the program. yes Ability and willingness to under diagnostic treatment. yes  Smoking Cessation Counseling: Current Smokers:  Discussed importance of smoking cessation. yes Information about  tobacco cessation classes and interventions provided to patient. yes Patient provided with "ticket" for LDCT Scan. yes Symptomatic Patient. no  Counseling NA Diagnosis Code: Tobacco Use Z72.0 Asymptomatic Patient yes  Counseling (Intermediate counseling: > three minutes counseling) [id number] Former Smokers:  Discussed the importance of maintaining cigarette abstinence. yes Diagnosis Code: Personal History of Nicotine  Dependence. O13.086 Information about tobacco cessation classes and interventions provided to patient. Yes Patient provided with "ticket" for LDCT Scan. yes Written Order for Lung Cancer Screening with LDCT placed in Epic. Yes (CT Chest Lung Cancer Screening Low Dose W/O CM) [id number] Z12.2-Screening of respiratory organs Z87.891-Personal history of nicotine  dependence   Valentin Gaskins, RN 05/29/23

## 2023-06-01 ENCOUNTER — Other Ambulatory Visit: Payer: Medicare Other

## 2023-06-01 ENCOUNTER — Ambulatory Visit
Admission: RE | Admit: 2023-06-01 | Discharge: 2023-06-01 | Disposition: A | Discharge status: Home / Self Care | Source: Ambulatory Visit | Attending: Acute Care | Admitting: Acute Care

## 2023-06-01 DIAGNOSIS — J439 Emphysema, unspecified: Secondary | ICD-10-CM | POA: Diagnosis not present

## 2023-06-01 DIAGNOSIS — Z122 Encounter for screening for malignant neoplasm of respiratory organs: Secondary | ICD-10-CM

## 2023-06-01 DIAGNOSIS — R918 Other nonspecific abnormal finding of lung field: Secondary | ICD-10-CM | POA: Diagnosis not present

## 2023-06-01 DIAGNOSIS — R911 Solitary pulmonary nodule: Secondary | ICD-10-CM

## 2023-06-01 DIAGNOSIS — Z87891 Personal history of nicotine dependence: Secondary | ICD-10-CM

## 2023-06-01 DIAGNOSIS — F17210 Nicotine dependence, cigarettes, uncomplicated: Secondary | ICD-10-CM | POA: Diagnosis not present

## 2023-06-01 DIAGNOSIS — I700 Atherosclerosis of aorta: Secondary | ICD-10-CM | POA: Diagnosis not present

## 2023-06-08 ENCOUNTER — Encounter
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
## 2023-06-26 ENCOUNTER — Encounter (INDEPENDENT_AMBULATORY_CARE_PROVIDER_SITE_OTHER): Payer: Self-pay | Admitting: Ophthalmology

## 2023-06-26 ENCOUNTER — Ambulatory Visit (INDEPENDENT_AMBULATORY_CARE_PROVIDER_SITE_OTHER): Payer: Medicare Other | Admitting: Ophthalmology

## 2023-06-26 DIAGNOSIS — H34832 Tributary (branch) retinal vein occlusion, left eye, with macular edema: Secondary | ICD-10-CM

## 2023-06-26 DIAGNOSIS — I10 Essential (primary) hypertension: Secondary | ICD-10-CM | POA: Diagnosis not present

## 2023-06-26 DIAGNOSIS — H35033 Hypertensive retinopathy, bilateral: Secondary | ICD-10-CM

## 2023-06-26 DIAGNOSIS — Z961 Presence of intraocular lens: Secondary | ICD-10-CM

## 2023-06-26 DIAGNOSIS — H401123 Primary open-angle glaucoma, left eye, severe stage: Secondary | ICD-10-CM

## 2023-06-26 MED ORDER — BEVACIZUMAB CHEMO INJECTION 1.25MG/0.05ML SYRINGE FOR KALEIDOSCOPE
1.2500 mg | INTRAVITREAL | Status: AC | PRN
  Administered 2023-06-26: 1.25 mg via INTRAVITREAL

## 2023-07-10 ENCOUNTER — Ambulatory Visit (INDEPENDENT_AMBULATORY_CARE_PROVIDER_SITE_OTHER): Admitting: Family Medicine

## 2023-07-10 VITALS — BP 132/82 | HR 72 | Temp 97.6°F | Ht 69.0 in | Wt 164.2 lb

## 2023-07-10 DIAGNOSIS — Z122 Encounter for screening for malignant neoplasm of respiratory organs: Secondary | ICD-10-CM

## 2023-07-10 DIAGNOSIS — F17200 Nicotine dependence, unspecified, uncomplicated: Secondary | ICD-10-CM

## 2023-07-10 DIAGNOSIS — I10 Essential (primary) hypertension: Secondary | ICD-10-CM

## 2023-07-10 DIAGNOSIS — Z0001 Encounter for general adult medical examination with abnormal findings: Secondary | ICD-10-CM | POA: Diagnosis not present

## 2023-07-10 DIAGNOSIS — Z8673 Personal history of transient ischemic attack (TIA), and cerebral infarction without residual deficits: Secondary | ICD-10-CM | POA: Diagnosis not present

## 2023-07-10 DIAGNOSIS — I6522 Occlusion and stenosis of left carotid artery: Secondary | ICD-10-CM

## 2023-07-10 DIAGNOSIS — Z0000 Encounter for general adult medical examination without abnormal findings: Secondary | ICD-10-CM

## 2023-07-10 DIAGNOSIS — I700 Atherosclerosis of aorta: Secondary | ICD-10-CM | POA: Diagnosis not present

## 2023-07-10 DIAGNOSIS — Z8546 Personal history of malignant neoplasm of prostate: Secondary | ICD-10-CM

## 2023-07-10 MED ORDER — TRAZODONE HCL 50 MG PO TABS: 50.0000 mg | ORAL_TABLET | Freq: Every evening | ORAL | 1 refills | Status: DC | PRN

## 2023-07-10 NOTE — Progress Notes (Signed)
 Subjective:   Oscar Greene is a 73 y.o. male who presents for Medicare Annual/Subsequent preventive examination.  Visit Complete: In person  Patient Medicare AWV questionnaire was completed by the patient on 07/10/23; I have confirmed that all information answered by patient is correct and no changes since this date.        Objective:    There were no vitals filed for this visit. There is no height or weight on file to calculate BMI.     05/25/2022    9:22 AM 04/08/2021    9:11 AM 06/16/2015    2:34 PM 11/17/2013    8:00 PM 11/17/2013   10:30 AM  Advanced Directives  Does Patient Have a Medical Advance Directive? Yes No Yes No No  Type of Estate agent of [place];Living will  Healthcare Power of Attorney    Does patient want to make changes to medical advance directive? No - Patient declined  No - Patient declined    Copy of Healthcare Power of Attorney in Chart? Yes - validated most recent copy scanned in chart (See row information)  No - copy requested    Would patient like information on creating a medical advance directive?  No - Patient declined  No - patient declined information No - patient declined information    Current Medications (verified) Outpatient Encounter Medications as of 07/10/2023  Medication Sig   acetaminophen  (TYLENOL ) 500 MG tablet Take 1 tablet (500 mg total) by mouth every 6 (six) hours as needed.   brimonidine (ALPHAGAN) 0.2 % ophthalmic solution 1 drop 2 (two) times daily.   cloNIDine  (CATAPRES ) 0.1 MG tablet TAKE 1 TABLET BY MOUTH TWICE A DAY   clopidogrel  (PLAVIX ) 75 MG tablet Take 1 tablet (75 mg total) by mouth daily.   dorzolamide -timolol  (COSOPT ) 22.3-6.8 MG/ML ophthalmic solution 1 DROP IN BOTH EYES TWICE A DAY   latanoprost (XALATAN) 0.005 % ophthalmic solution Place 1 drop into both eyes daily.   naproxen sodium (ALEVE) 220 MG tablet Take 220 mg by mouth daily as needed.   pantoprazole  (PROTONIX ) 40 MG tablet Take by  mouth.   predniSONE  (DELTASONE ) 5 MG tablet Take by mouth.   rosuvastatin  (CRESTOR ) 20 MG tablet TAKE 1 TABLET BY MOUTH EVERY DAY   No facility-administered encounter medications on file as of 07/10/2023.    Allergies (verified) Patient has no known allergies.   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 Candi Chafe   L frontal craniotomy to remove blood clot     Family History  Problem Relation Age of Onset   Cancer Mother    Hypertension Father    Social History   Socioeconomic History   Marital status: Single    Spouse name: Not on file   Number of children: Not on file   Years of education: Not on file   Highest education level: Not on file  Occupational History  Not on file  Tobacco Use   Smoking status: Every Day    Current packs/day: 0.34    Average packs/day: 0.3 packs/day for 45.0 years (15.3 ttl pk-yrs)    Types: Cigarettes   Smokeless tobacco: Never  Vaping Use   Vaping status: Never Used  Substance and Sexual Activity   Alcohol  use: Yes    Comment: occasional   Drug use: No   Sexual activity: Yes  Other Topics Concern   Not on file  Social History Narrative   Patient states that he lives alone.   States that his sister lives very close to him  (distance he describes is approx distance of one city block) distance from him.   States that he also has a brother who lives very close to him--says that he lives about the same distance from him as the sister does.   He was  smoking about one pack a day till he stopped at the time of his hospitalization 11/2013.   Drug screen was positive for cocaine at admission to hospital 11/2013. Patient reports at office visit 11/2013 he does not use cocaine on a routine basis and that he was " at a party 2 weeks ago"   Social Drivers of Corporate investment banker Strain: Low Risk  (05/25/2022)   Overall Financial Resource Strain (CARDIA)    Difficulty of Paying Living Expenses: Not hard at all  Food Insecurity: No Food Insecurity (05/25/2022)   Hunger Vital Sign    Worried About Running Out of Food in the Last Year: Never true    Ran Out of Food in the Last Year: Never true  Transportation Needs: No Transportation Needs (05/25/2022)   PRAPARE - Administrator, Civil Service (Medical): No    Lack of Transportation (Non-Medical): No  Physical Activity: Insufficiently Active (05/25/2022)   Exercise Vital Sign    Days of Exercise per Week: 3 days    Minutes of Exercise per Session: 30 min  Stress: No Stress Concern Present (05/25/2022)   [employer] of Occupational Health - Occupational Stress Questionnaire    Feeling of Stress : Not at all  Social Connections: Moderately Integrated (05/25/2022)   Social Connection and Isolation Panel [NHANES]    Frequency of Communication with Friends and Family: More than three times a week    Frequency of Social Gatherings with Friends and Family: More than three times a week    Attends Religious Services: 1 to 4 times per year    Active Member of [employer] or Organizations: Yes    Attends Engineer, structural: More than 4 times per year    Marital Status: Never married    Tobacco Counseling Ready to quit: Not Answered Counseling given: Not Answered   Clinical Intake:                        Activities of Daily Living     No data to display          Patient Care Team: Austine Lefort, MD as PCP - General (Family Medicine) Seward Dao Alleen Arbour, MD as Consulting Physician (Ophthalmology) Ronelle Coffee, MD as Consulting Physician (Ophthalmology)  Indicate any recent Medical Services you may have received from other than [facility] providers in the past year (date may be approximate).     Assessment:   This is a routine wellness examination for [place].  Hearing/Vision screen No results found.   Goals Addressed  None    Depression Screen    09/12/2022    8:30 AM 05/25/2022    9:21 AM 04/08/2021    9:08 AM 09/05/2018    8:14 AM 05/21/2017   12:01 PM  PHQ 2/9 Scores  PHQ - 2 Score 0 0 0 0 0    Fall Risk    09/12/2022    8:30 AM 05/25/2022    9:20 AM 04/08/2021    9:13 AM 09/05/2018    8:14 AM 05/21/2017   12:01 PM  Fall Risk   Falls in the past year? 0 0 0 0 No  Number falls in past yr: 0 0 0    Injury with Fall? 0 0 0    Risk for fall due to :  Impaired mobility;Impaired balance/gait Impaired balance/gait;Impaired mobility    Follow up  Falls prevention discussed;Education provided;Falls evaluation completed Falls prevention discussed Falls evaluation completed     MEDICARE RISK AT HOME:    TIMED UP AND GO:  Was the test performed?  Yes  Length of time to ambulate 10 feet: 8 sec Gait steady and fast with assistive device    Cognitive Function:        05/25/2022    9:23 AM 04/08/2021    9:34 AM 04/08/2021    9:15 AM  6CIT Screen  What Year? 0 points 0 points 0 points  What month? 0 points 0 points 0 points  What time? 0 points 0 points 0 points  Count back from 20 0 points 0 points 0 points  Months in reverse 0 points 4 points 4 points  Repeat phrase 0 points 4 points 4 points  Total Score 0 points 8 points 8 points    Immunizations Immunization History  Administered Date(s) Administered   Fluad Quad(high Dose 65+) 01/07/2019   Fluad Trivalent(High Dose 65+) 12/12/2022   Influenza-Unspecified 12/18/2021   Moderna Covid-19 Vaccine Bivalent Booster 69yrs & up 01/10/2021   Moderna Sars-Covid-2 Vaccination  07/01/2019   PFIZER(Purple Top)SARS-COV-2 Vaccination 02/28/2020   Pneumococcal Conjugate-13 05/21/2017   Pneumococcal Polysaccharide-23 01/05/2015   Zoster Recombinant(Shingrix) 04/09/2021, 06/06/2021    TDAP status: Due, Education has been provided regarding the importance of this vaccine. Advised may receive this vaccine at local pharmacy or Health Dept. Aware to provide a copy of the vaccination record if obtained from local pharmacy or Health Dept. Verbalized acceptance and understanding.  Flu Vaccine status: Up to date  Pneumococcal vaccine status: Up to date  Covid-19 vaccine status: Completed vaccines  Qualifies for Shingles Vaccine? Yes   Zostavax completed Yes   Shingrix Completed?: Yes  Screening Tests Health Maintenance  Topic Date Due   DTaP/Tdap/Td (1 - Tdap) Never done   Colonoscopy  Never done   COVID-19 Vaccine (4 - 2024-25 season) 11/12/2022   Medicare Annual Wellness (AWV)  09/12/2023   INFLUENZA VACCINE  10/12/2023   Pneumonia Vaccine 54+ Years old  Completed   Hepatitis C Screening  Completed   Zoster Vaccines- Shingrix  Completed   HPV VACCINES  Aged Out   Meningococcal B Vaccine  Aged Out   Lung Cancer Screening  Discontinued    Health Maintenance  Health Maintenance Due  Topic Date Due   DTaP/Tdap/Td (1 - Tdap) Never done   Colonoscopy  Never done   COVID-19 Vaccine (4 - 2024-25 season) 11/12/2022    Colorectal cancer screening: Referral to GI placed PT DECLINED. Pt aware the office will call re: appt.  Lung Cancer Screening: (Low Dose CT  Chest recommended if Age 66-80 years, 20 pack-year currently smoking OR have quit w/in 15years.) does qualify.   Lung Cancer Screening Referral: 06/01/2023  Additional Screening:  Hepatitis C Screening: does qualify; Completed 05/21/2017  Vision Screening: Recommended annual ophthalmology exams for early detection of glaucoma and other disorders of the eye. Is the patient up to date with their annual eye  exam?  Yes  Who is the provider or what is the name of the office in which the patient attends annual eye exams? DR. Candi Chafe If pt is not established with a provider, would they like to be referred to a provider to establish care? Yes .   Dental Screening: Recommended annual dental exams for proper oral hygiene  Diabetic Foot Exam: N/A  Community Resource Referral / Chronic Care Management: CRR required this visit?  No   CCM required this visit?  No     Plan:     I have personally reviewed and noted the following in the patient's chart:   Medical and social history Use of alcohol , tobacco or illicit drugs  Current medications and supplements including opioid prescriptions. Patient is not currently taking opioid prescriptions. Functional ability and status Nutritional status Physical activity Advanced directives List of other physicians Hospitalizations, surgeries, and ER visits in previous 12 months Vitals Screenings to include cognitive, depression, and falls Referrals and appointments  In addition, I have reviewed and discussed with patient certain preventive protocols, quality metrics, and best practice recommendations. A written personalized care plan for preventive services as well as general preventive health recommendations were provided to patient.     Verneda Golder, LPN   [id number]/[id number]   After Visit Summary: (In Person-Printed) AVS printed and given to the patient  Nurse Notes: Pt declines colonoscopy. Discussed Tdap.    I have collaborated with the care management provider regarding care management and care coordination activities outlined in this encounter and have reviewed this encounter including documentation in the note and care plan. I am certifying that I agree with the content of this note and encounter as supervising physician.

## 2023-07-10 NOTE — Progress Notes (Signed)
 Subjective:    Patient ID: Oscar Greene, male    DOB: 11-18-1950, 73 y.o.   MRN: [id number]   Patient is here today for complete physical exam.  Unfortunately he continues to smoke.  He has no desire to quit.   He had CT of the lung in 3/25 that showed no evidence of lung cancer, but did show atherosclerosis. He has a history of prostate cancer.  He was diagnosed with a Gleason 7 prostate cancer.  PSA was over 13 last year and we recommended that he see his urologist but I do not see where he ever followed up.  Colonoscopy is overdue.  Immunizations are utd except for RSV.   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 Candi Chafe   L frontal craniotomy to remove blood clot     Current Outpatient Medications on File Prior to Visit  Medication Sig Dispense Refill   acetaminophen  (TYLENOL ) 500 MG tablet Take 1 tablet (500 mg total) by mouth every 6 (six) hours as needed. 30 tablet 0   brimonidine (ALPHAGAN) 0.2 % ophthalmic solution 1 drop 2 (two) times daily.     cloNIDine  (CATAPRES ) 0.1 MG tablet TAKE 1 TABLET BY MOUTH TWICE A DAY 60 tablet 0   clopidogrel  (PLAVIX ) 75 MG tablet Take 1 tablet (75 mg total) by mouth daily. 90 tablet 1   dorzolamide -timolol  (COSOPT ) 22.3-6.8 MG/ML ophthalmic solution 1 DROP IN BOTH EYES TWICE A DAY  4   latanoprost (XALATAN) 0.005 % ophthalmic solution Place 1 drop into both eyes daily.     naproxen sodium (ALEVE) 220 MG tablet Take 220 mg by mouth daily as needed.     pantoprazole   (PROTONIX ) 40 MG tablet Take by mouth.     predniSONE  (DELTASONE ) 5 MG tablet Take by mouth.     rosuvastatin  (CRESTOR ) 20 MG tablet TAKE 1 TABLET BY MOUTH EVERY DAY 90 tablet 0   No current facility-administered medications on file prior to visit.   No Known Allergies  Social History   Socioeconomic History   Marital status: Single    Spouse name: Not on file   Number of children: Not on file   Years of education: Not on file   Highest education level: Not on file  Occupational History   Not on file  Tobacco Use   Smoking status: Every Day    Current packs/day: 0.34    Average packs/day: 0.3 packs/day for 45.0 years (15.3 ttl pk-yrs)    Types: Cigarettes   Smokeless tobacco: Never  Vaping Use   Vaping status: Never Used  Substance and Sexual Activity   Alcohol  use: Yes    Comment: occasional   Drug use: No   Sexual activity: Yes  Other Topics Concern   Not on file  Social History Narrative   Patient states that he lives alone.   States that his sister lives very close to him  (distance he  describes is approx distance of one city block) distance from him.   States that he also has a brother who lives very close to him--says that he lives about the same distance from him as the sister does.   He was smoking about one pack a day till he stopped at the time of his hospitalization 11/2013.   Drug screen was positive for cocaine at admission to hospital 11/2013. Patient reports at office visit 11/2013 he does not use cocaine on a routine basis and that he was " at a party 2 weeks ago"   Social Drivers of Corporate investment banker Strain: Low Risk  (05/25/2022)   Overall Financial Resource Strain (CARDIA)    Difficulty of Paying Living Expenses: Not hard at all  Food Insecurity: No Food Insecurity (05/25/2022)   Hunger Vital Sign    Worried About Running Out of Food in the Last Year: Never true    Ran Out of Food in the Last Year: Never true  Transportation Needs: No  Transportation Needs (05/25/2022)   PRAPARE - Administrator, Civil Service (Medical): No    Lack of Transportation (Non-Medical): No  Physical Activity: Insufficiently Active (05/25/2022)   Exercise Vital Sign    Days of Exercise per Week: 3 days    Minutes of Exercise per Session: 30 min  Stress: No Stress Concern Present (05/25/2022)   [employer] of Occupational Health - Occupational Stress Questionnaire    Feeling of Stress : Not at all  Social Connections: Moderately Integrated (05/25/2022)   Social Connection and Isolation Panel [NHANES]    Frequency of Communication with Friends and Family: More than three times a week    Frequency of Social Gatherings with Friends and Family: More than three times a week    Attends Religious Services: 1 to 4 times per year    Active Member of [employer] or Organizations: Yes    Attends Banker Meetings: More than 4 times per year    Marital Status: Never married  Intimate Partner Violence: Not At Risk (05/25/2022)   Humiliation, Afraid, Rape, and Kick questionnaire    Fear of Current or Ex-Partner: No    Emotionally Abused: No    Physically Abused: No    Sexually Abused: No       Review of Systems  All other systems reviewed and are negative.      Objective:   Physical Exam Vitals reviewed.  Constitutional:      Appearance: He is well-developed.  Neck:     Vascular: No JVD.  Cardiovascular:     Rate and Rhythm: Normal rate and regular rhythm.     Heart sounds: Normal heart sounds.  Pulmonary:     Effort: Pulmonary effort is normal.     Breath sounds: Wheezing and rales present.  Abdominal:     General: Bowel sounds are normal. There is no distension.     Palpations: Abdomen is soft.     Tenderness: There is no abdominal tenderness. There is no guarding or rebound.         Assessment & Plan:  Encounter for Medicare annual wellness exam  Encounter for screening for lung cancer  Benign essential  HTN - Plan: CBC with Differential/Platelet, COMPLETE METABOLIC PANEL WITHOUT GFR, Lipid panel  Smoker - Plan: CBC with Differential/Platelet, COMPLETE METABOLIC PANEL WITHOUT GFR, Lipid panel  Left carotid stenosis - Plan: CBC with Differential/Platelet, COMPLETE METABOLIC PANEL WITHOUT GFR, Lipid panel  History of  CVA (cerebrovascular accident) - Plan: CBC with Differential/Platelet, COMPLETE METABOLIC PANEL WITHOUT GFR, Lipid panel  History of prostate cancer - Plan: PSA  Atherosclerosis of aorta (HCC)  Strongly recommended smoking cessation.  However the patient has no desire to quit at the present time. Recent CT scan of the lungs to screen for lung cancer was negative.  Recommended colon cancer screening at least with Cologuard but the patient declines at the present time.  He has a history of Gleason 7 prostate cancer.  Patient refuses treatment and declines referral to Urology.  Check CBC CMP and lipid panel.  Given his history of left carotid stenosis and right carotid artery occlusion and previous stroke, I would like to maintain his LDL cholesterol less than 55.  His blood pressure today is excellent.  Patient is currently on Plavix  and rosuvastatin  for secondary prevention of stroke.  Patient requests sleeping aid for occasional use.  Gave him trazodone 50 mg poqhs prn.

## 2023-07-11 LAB — CBC WITH DIFFERENTIAL/PLATELET
Absolute Lymphocytes: 1699 {cells}/uL (ref 850–3900)
Absolute Monocytes: 570 {cells}/uL (ref 200–950)
Basophils Absolute: 68 {cells}/uL (ref 0–200)
Basophils Relative: 1.2 %
Eosinophils Absolute: 462 {cells}/uL (ref 15–500)
Eosinophils Relative: 8.1 %
HCT: 43.6 % (ref 38.5–50.0)
Hemoglobin: 14 g/dL (ref 13.2–17.1)
MCH: 31.5 pg (ref 27.0–33.0)
MCHC: 32.1 g/dL (ref 32.0–36.0)
MCV: 98 fL (ref 80.0–100.0)
MPV: 12.8 fL — ABNORMAL HIGH (ref 7.5–12.5)
Monocytes Relative: 10 %
Neutro Abs: 2901 {cells}/uL (ref 1500–7800)
Neutrophils Relative %: 50.9 %
Platelets: 148 10*3/uL (ref 140–400)
RBC: 4.45 10*6/uL (ref 4.20–5.80)
RDW: 11.7 % (ref 11.0–15.0)
Total Lymphocyte: 29.8 %
WBC: 5.7 10*3/uL (ref 3.8–10.8)

## 2023-07-11 LAB — COMPLETE METABOLIC PANEL WITHOUT GFR
AG Ratio: 1.9 (calc) (ref 1.0–2.5)
ALT: 18 U/L (ref 9–46)
AST: 23 U/L (ref 10–35)
Albumin: 4.5 g/dL (ref 3.6–5.1)
Alkaline phosphatase (APISO): 54 U/L (ref 35–144)
BUN: 24 mg/dL (ref 7–25)
CO2: 28 mmol/L (ref 20–32)
Calcium: 9.7 mg/dL (ref 8.6–10.3)
Chloride: 106 mmol/L (ref 98–110)
Creat: 1.04 mg/dL (ref 0.70–1.28)
Globulin: 2.4 g/dL (ref 1.9–3.7)
Glucose, Bld: 84 mg/dL (ref 65–99)
Potassium: 4.7 mmol/L (ref 3.5–5.3)
Sodium: 141 mmol/L (ref 135–146)
Total Bilirubin: 0.6 mg/dL (ref 0.2–1.2)
Total Protein: 6.9 g/dL (ref 6.1–8.1)

## 2023-07-11 LAB — LIPID PANEL
Cholesterol: 148 mg/dL (ref <200)
HDL: 50 mg/dL (ref >=40)
LDL Cholesterol (Calc): 75 mg/dL
Non-HDL Cholesterol (Calc): 98 mg/dL (ref <130)
Total CHOL/HDL Ratio: 3 (calc) (ref <5.0)
Triglycerides: 157 mg/dL — ABNORMAL HIGH (ref <150)

## 2023-07-11 LAB — PSA: PSA: 18.35 ng/mL — ABNORMAL HIGH (ref <=4.00)

## 2023-07-16 ENCOUNTER — Telehealth: Payer: Self-pay

## 2023-07-16 NOTE — Telephone Encounter (Signed)
 Copied from CRM [id number]. Topic: General - Call Back - No Documentation >> Jul 16, 2023  9:17 AM [place] E wrote: Reason for CRM: Patient's sister, Amalia Badder, called stating she missed a call from the clinic, agent could not locate what this call might have been in reference too. Callback number for Amalia Badder is [phone].

## 2023-07-18 ENCOUNTER — Other Ambulatory Visit: Payer: Self-pay | Admitting: Acute Care

## 2023-07-18 DIAGNOSIS — F17210 Nicotine dependence, cigarettes, uncomplicated: Secondary | ICD-10-CM

## 2023-07-18 DIAGNOSIS — Z87891 Personal history of nicotine dependence: Secondary | ICD-10-CM

## 2023-07-18 DIAGNOSIS — Z122 Encounter for screening for malignant neoplasm of respiratory organs: Secondary | ICD-10-CM
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
## 2023-08-14 ENCOUNTER — Encounter (INDEPENDENT_AMBULATORY_CARE_PROVIDER_SITE_OTHER): Payer: Self-pay | Admitting: Ophthalmology

## 2023-08-14 ENCOUNTER — Ambulatory Visit (INDEPENDENT_AMBULATORY_CARE_PROVIDER_SITE_OTHER): Admitting: Ophthalmology

## 2023-08-14 DIAGNOSIS — Z961 Presence of intraocular lens: Secondary | ICD-10-CM

## 2023-08-14 DIAGNOSIS — H34832 Tributary (branch) retinal vein occlusion, left eye, with macular edema: Secondary | ICD-10-CM | POA: Diagnosis not present

## 2023-08-14 DIAGNOSIS — H401123 Primary open-angle glaucoma, left eye, severe stage: Secondary | ICD-10-CM

## 2023-08-14 DIAGNOSIS — H35033 Hypertensive retinopathy, bilateral: Secondary | ICD-10-CM | POA: Diagnosis not present

## 2023-08-14 DIAGNOSIS — I10 Essential (primary) hypertension: Secondary | ICD-10-CM | POA: Diagnosis not present

## 2023-08-14 MED ORDER — BEVACIZUMAB CHEMO INJECTION 1.25MG/0.05ML SYRINGE FOR KALEIDOSCOPE
1.2500 mg | INTRAVITREAL | Status: AC | PRN
  Administered 2023-08-14: 1.25 mg via INTRAVITREAL

## 2023-08-25 ENCOUNTER — Other Ambulatory Visit: Payer: Self-pay | Admitting: Family Medicine

## 2023-09-10 NOTE — Progress Notes (Signed)
 [facility] - Clinic Note  09/11/2023     CHIEF COMPLAINT Patient presents for Retina Follow Up   HISTORY OF PRESENT ILLNESS: Oscar Greene is a 73 y.o. male who presents to the clinic today for:   HPI     Retina Follow Up   Patient presents with  CRVO/BRVO.  In left eye.  This started 4 years ago.  Duration of 4 weeks.  Since onset it is stable.  I, the attending physician,  performed the HPI with the patient and updated documentation appropriately.        Comments   Pt states no changes in vision. Pt has an appt coming up with Dr. Glendia Gaudy this month. Pt denies flashes/floaters/discomfort. Pt states he is using  Brimonidine and Cosopt  bid OU, Latanoprost qhs OU      Last edited by [name], MD on 09/11/2023 10:35 AM.    Patient states his vision is about the same  Referring physician: Duanne Butler DASEN, MD [facility] [address] [place],  [place] [address]  HISTORICAL INFORMATION:   Selected notes from the MEDICAL RECORD NUMBER Dr. [name]:  Ocular Hx- PMH-    CURRENT MEDICATIONS: Current Outpatient Medications (Ophthalmic Drugs)  Medication Sig   brimonidine (ALPHAGAN) 0.2 % ophthalmic solution 1 drop 2 (two) times daily.   dorzolamide -timolol  (COSOPT ) 22.3-6.8 MG/ML ophthalmic solution 1 DROP IN BOTH EYES TWICE A DAY   latanoprost (XALATAN) 0.005 % ophthalmic solution Place 1 drop into both eyes daily.   No current facility-administered medications for this visit. (Ophthalmic Drugs)   Current Outpatient Medications (Other)  Medication Sig   acetaminophen  (TYLENOL ) 500 MG tablet Take 1 tablet (500 mg total) by mouth every 6 (six) hours as needed.   cloNIDine  (CATAPRES ) 0.1 MG tablet TAKE 1 TABLET BY MOUTH TWICE A DAY   clopidogrel  (PLAVIX ) 75 MG tablet TAKE 1 TABLET BY MOUTH EVERY DAY   naproxen sodium (ALEVE) 220 MG tablet Take 220 mg by mouth daily as needed.   pantoprazole  (PROTONIX ) 40 MG tablet Take by mouth.    predniSONE  (DELTASONE ) 5 MG tablet Take by mouth.   rosuvastatin  (CRESTOR ) 20 MG tablet TAKE 1 TABLET BY MOUTH EVERY DAY   traZODone  (DESYREL ) 50 MG tablet Take 1 tablet (50 mg total) by mouth at bedtime as needed for sleep.   No current facility-administered medications for this visit. (Other)   REVIEW OF SYSTEMS: ROS   Positive for: Gastrointestinal, Neurological, Eyes Negative for: Constitutional, Skin, Genitourinary, Musculoskeletal, HENT, Endocrine, Cardiovascular, Respiratory, Psychiatric, Allergic/Imm, Heme/Lymph Last edited by Elnor Avelina RAMAN, [name] on 09/11/2023 10:09 AM.     ALLERGIES No Known Allergies  PAST MEDICAL HISTORY Past Medical History:  Diagnosis Date   AKI (acute kidney injury) (HCC) 06/2015   Asymptomatic carotid artery stenosis, right    Cataract    CVA (cerebral vascular accident) (HCC)    Glaucoma    Headache(784.0)    History of traumatic head injury 03/14/1983   A pitching wedge hit him in left frontal area   Hypertension    Osteoporosis    Prostate cancer (HCC)    Dr. Chauncey, Gleason 7   Thrombocytopenia [facility])    Past Surgical History:  Procedure Laterality Date   CATARACT EXTRACTION W/PHACO Bilateral 2013   Dr. Gaudy   L frontal craniotomy to remove blood clot     FAMILY HISTORY Family History  Problem Relation Age of Onset   Cancer Mother  Hypertension Father    SOCIAL HISTORY Social History   Tobacco Use   Smoking status: Every Day    Current packs/day: 0.34    Average packs/day: 0.3 packs/day for 45.0 years (15.3 ttl pk-yrs)    Types: Cigarettes   Smokeless tobacco: Never  Vaping Use   Vaping status: Never Used  Substance Use Topics   Alcohol  use: Yes    Comment: occasional   Drug use: No       OPHTHALMIC EXAM:  Base Eye Exam     Visual Acuity (Snellen - Linear)       Right Left   Dist [place] 20/40 +1 20/40 -1   Dist ph [place] 20/30 20/30 +1         Tonometry (Tonopen, 10:06 AM)       Right Left   Pressure 10 16          Pupils       Pupils Dark Light Shape React APD   Right PERRL 4 3 Round Brisk None   Left PERRL 4 3 Round Brisk None         Visual Fields       Left Right    Full Full         Extraocular Movement       Right Left    Full, Ortho Full, Ortho         Neuro/Psych     Oriented x3: Yes   Mood/Affect: Normal         Dilation     Both eyes: 1.0% Mydriacyl, 2.5% Phenylephrine @ 10:06 AM           Slit Lamp and Fundus Exam     External Exam       Right Left   External Normal Normal         Slit Lamp Exam       Right Left   Lids/Lashes Dermatochalasis Dermatochalasis - upper lid   Conjunctiva/Sclera Melanosis Melanosis   Cornea Well healed temporal cataract wound Well healed temporal cataract wound, trace Punctate epithelial erosions   Anterior Chamber Deep and quiet Deep and quiet   Iris Round and Dilated Round and Dilated   Lens Posterior chamber intraocular lens, PC IOL in good position w/ open PC PC IOL in good position w/ open PC   Anterior Vitreous Vitreous syneresis Vitreous syneresis, silicone oil micro bubbles, Posterior vitreous detachment, vitreous condensations         Fundus Exam       Right Left   Disc 3+ Pallor, Sharp rim, +cupping, Thin inferior rim temporal pallor, +cupping, vascular loops sup disc   C/D Ratio 0.9 0.85   Macula Flat, good foveal reflex, No heme or edema, RPE mottling good foveal reflex, interval improvement in cystic changes / edema, no heme   Vessels attenuated, Tortuous Attenuated, Copper wiring, 3+ tortuosity, +beading ST venule, sclerotic vessels ST periphery   Periphery Attached, No heme Attached, no heme, segmental PRP scars temporal periphery           IMAGING AND PROCEDURES  Imaging and Procedures for 09/11/2023  OCT, Retina - OU - Both Eyes       Right Eye Quality was good. Central Foveal Thickness: 241. Progression has been stable. Findings include normal foveal contour, no IRF, no SRF.    Left Eye Quality was good. Central Foveal Thickness: 224. Progression has improved. Findings include no IRF, no SRF, abnormal foveal contour, intraretinal hyper-reflective material, inner retinal  atrophy (Interval improvement in focal IRF/cystic changes SN macula and fovea).   Notes *Images captured and stored on drive  Diagnosis / Impression:  OD: NFP, no IRF/SRF OS: Interval improvement in focal IRF/cystic changes SN macula and fovea  Clinical management:  See below  Abbreviations: NFP - Normal foveal profile. CME - cystoid macular edema. PED - pigment epithelial detachment. IRF - intraretinal fluid. SRF - subretinal fluid. EZ - ellipsoid zone. ERM - epiretinal membrane. ORA - outer retinal atrophy. ORT - outer retinal tubulation. SRHM - subretinal hyper-reflective material. IRHM - intraretinal hyper-reflective material      Intravitreal Injection, Pharmacologic Agent - OS - Left Eye       Time Out 09/11/2023. 10:24 AM. Confirmed correct patient, procedure, site, and patient consented.   Anesthesia Topical anesthesia was used. Anesthetic medications included Lidocaine 2%, Proparacaine 0.5%.   Procedure Preparation included 5% betadine to ocular surface, eyelid speculum. A supplied (32 g) needle was used.   Injection: 1.25 mg Bevacizumab  1.25mg /0.78ml   Route: Intravitreal, Site: Left Eye   NDC: 49757-939-98, Lot: [id number], Expiration date: 10/08/2023   Post-op Post injection exam found visual acuity of at least counting fingers. The patient tolerated the procedure well. There were no complications. The patient received written and verbal post procedure care education. Post injection medications were not given.            ASSESSMENT/PLAN:    ICD-10-CM   1. Branch retinal vein occlusion with macular edema of left eye  H34.8320 OCT, Retina - OU - Both Eyes    Intravitreal Injection, Pharmacologic Agent - OS - Left Eye    Bevacizumab  (AVASTIN ) SOLN 1.25 mg    2. Essential  hypertension  I10     3. Hypertensive retinopathy of both eyes  H35.033     4. Pseudophakia of both eyes  Z96.1     5. Primary open angle glaucoma (POAG) of left eye, severe stage  H40.1123      BRVO w/ CME, OS - Pt transferred care from Dr. [name] due to insurance  - s/p IVA OS x19 from 06/26/19-10/26/21 -- on q6-7 wk schedule - s/p IVA OS #20 here (10.23.23, 9+wks ), #21 (12.04.23), #22 (01.24.24), #23 (02.28.24), #24 (04.04.24), #25 (5.16.24), #26 (04.15.25), #27 (06.03.25) - s/p [name] #1 (06.18.24), #2 (07.23.24), #3 (08.27.24), #4 (10.01.24), #5 (11.12.24), #6 (12.31.24), #7 (02.18.25 -- sample) **h/o increased IRF/edema at 7 wks on 06.03.25, 01.24.24 and 6 wks on 05.16.24 [w/ IVA]** - BCVA OS 20/30 - stable - OCT OS shows interval improvement in focal IRF/cystic changes SN macula and fovea at 4 weeks - recommend IVA OS #27 today w/ f/u extended to 5-6 wks  - RBA of procedure discussed, questions answered - IVA informed consent obtained and signed, 06.18.24 (OS) - see procedure note - Eylea  approved, but [employer] funding unavailable - f/u 5-6 wks -- DFE/OCT, possible injection  2,3. Hypertensive retinopathy OU - discussed importance of tight BP control - monitor  4. Pseudophakia OU  - s/p CE/IOL OU  - IOL in good position, doing well  - monitor  5. POAG OU, severe stage  - under the expert management of Dr. Octavia - IOP 10,16  - patient on brimonidine and cosopt  bid OU, latanoprost at bedtime OU  Ophthalmic Meds Ordered this visit:  Meds ordered this encounter  Medications   Bevacizumab  (AVASTIN ) SOLN 1.25 mg     Return for f/u 5-6 weeks, BRVO OS, DFE, OCT, Possible Injxn.  There  are no Patient Instructions on file for this visit.   Explained the diagnoses, plan, and follow up with the patient and they expressed understanding.  Patient expressed understanding of the importance of proper follow up care.   This document serves as a record of services personally  performed by Redell JUDITHANN Hans, MD, PhD. It was created on their behalf by Auston Muzzy, COMT. The creation of this record is the provider's dictation and/or activities during the visit.  Electronically signed by: Auston Muzzy, COMT 09/11/23 10:39 AM  This document serves as a record of services personally performed by Redell JUDITHANN Hans, MD, PhD. It was created on their behalf by Alan PARAS. Delores, OA an ophthalmic technician. The creation of this record is the provider's dictation and/or activities during the visit.    Electronically signed by: Alan PARAS. Delores, OA 09/11/23 10:39 AM  Redell JUDITHANN Hans, M.D., Ph.D. Diseases & Surgery of the Retina and Vitreous [facility]  I have reviewed the above documentation for accuracy and completeness, and I agree with the above. Redell JUDITHANN Hans, M.D., Ph.D. 09/11/23 10:44 AM   Abbreviations: M myopia (nearsighted); A astigmatism; H hyperopia (farsighted); P presbyopia; Mrx spectacle prescription;  CTL contact lenses; OD right eye; OS left eye; OU both eyes  XT exotropia; ET esotropia; PEK punctate epithelial keratitis; PEE punctate epithelial erosions; DES dry eye syndrome; MGD meibomian gland dysfunction; ATs artificial tears; PFAT's preservative free artificial tears; NSC nuclear sclerotic cataract; PSC posterior subcapsular cataract; ERM epi-retinal membrane; PVD posterior vitreous detachment; RD retinal detachment; DM diabetes mellitus; DR diabetic retinopathy; NPDR non-proliferative diabetic retinopathy; PDR proliferative diabetic retinopathy; CSME clinically significant macular edema; DME diabetic macular edema; dbh dot blot hemorrhages; CWS cotton wool spot; POAG primary open angle glaucoma; C/D cup-to-disc ratio; HVF humphrey visual field; GVF goldmann visual field; OCT optical coherence tomography; IOP intraocular pressure; BRVO Branch retinal vein occlusion; CRVO central retinal vein occlusion; CRAO central retinal artery occlusion;  BRAO branch retinal artery occlusion; RT retinal tear; SB scleral buckle; PPV pars plana vitrectomy; VH Vitreous hemorrhage; PRP panretinal laser photocoagulation; IVK intravitreal kenalog; VMT vitreomacular traction; MH Macular hole;  NVD neovascularization of the disc; NVE neovascularization elsewhere; AREDS age related eye disease study; ARMD age related macular degeneration; POAG primary open angle glaucoma; EBMD epithelial/anterior basement membrane dystrophy; ACIOL anterior chamber intraocular lens; IOL intraocular lens; PCIOL posterior chamber intraocular lens; Phaco/IOL phacoemulsification with intraocular lens placement; PRK photorefractive keratectomy; LASIK laser assisted in situ keratomileusis; HTN hypertension; DM diabetes mellitus; COPD chronic obstructive pulmonary disease

## 2023-09-11 ENCOUNTER — Encounter (INDEPENDENT_AMBULATORY_CARE_PROVIDER_SITE_OTHER): Payer: Self-pay | Admitting: Ophthalmology

## 2023-09-11 ENCOUNTER — Ambulatory Visit (INDEPENDENT_AMBULATORY_CARE_PROVIDER_SITE_OTHER): Admitting: Ophthalmology

## 2023-09-11 DIAGNOSIS — H34832 Tributary (branch) retinal vein occlusion, left eye, with macular edema: Secondary | ICD-10-CM | POA: Diagnosis not present

## 2023-09-11 DIAGNOSIS — H35033 Hypertensive retinopathy, bilateral: Secondary | ICD-10-CM | POA: Diagnosis not present

## 2023-09-11 DIAGNOSIS — H401123 Primary open-angle glaucoma, left eye, severe stage: Secondary | ICD-10-CM

## 2023-09-11 DIAGNOSIS — Z961 Presence of intraocular lens: Secondary | ICD-10-CM

## 2023-09-11 DIAGNOSIS — I10 Essential (primary) hypertension: Secondary | ICD-10-CM | POA: Diagnosis not present

## 2023-09-11 MED ORDER — BEVACIZUMAB CHEMO INJECTION 1.25MG/0.05ML SYRINGE FOR KALEIDOSCOPE
1.2500 mg | INTRAVITREAL | Status: AC | PRN
  Administered 2023-09-11: 1.25 mg via INTRAVITREAL

## 2023-09-17 ENCOUNTER — Other Ambulatory Visit: Payer: Self-pay | Admitting: Family Medicine

## 2023-09-17 NOTE — Telephone Encounter (Signed)
      Copied from CRM [id number]. Topic: Clinical - Prescription Issue >> Sep 17, 2023  8:08 AM Kevelyn M wrote: Reason for CRM: Patient has not had his blood pressure meds for two week. A refill request has been sent.

## 2023-09-17 NOTE — Telephone Encounter (Unsigned)
 Copied from CRM [id number]. Topic: Clinical - Medication Refill >> Sep 17, 2023  8:05 AM Kevelyn M wrote: Medication:  cloNIDine  (CATAPRES ) 0.1 MG tablet   Has the patient contacted their pharmacy? Yes (Agent: If no, request that the patient contact the pharmacy for the refill. If patient does not wish to contact the pharmacy document the reason why and proceed with request.) (Agent: If yes, when and what did the pharmacy advise?)  This is the patient's preferred pharmacy:  [id number] [place], [place] - [address] [place] [address] Phone: [phone] Fax: [phone]  Is this the correct pharmacy for this prescription? Yes If no, delete pharmacy and type the correct one.   Has the prescription been filled recently? No  Is the patient out of the medication? Yes, he has been out of his blood pressure medication for  2 weeks.  Has the patient been seen for an appointment in the last year OR does the patient have an upcoming appointment? Yes  Can we respond through MyChart? No  Agent: Please be advised that Rx refills may take up to 3 business days. We ask that you follow-up with your pharmacy.

## 2023-09-18 ENCOUNTER — Telehealth: Payer: Self-pay | Admitting: Family Medicine

## 2023-09-18 NOTE — Telephone Encounter (Signed)
 Copied from CRM [id number]. Topic: Clinical - Medication Question >> Sep 18, 2023  8:11 AM Essie A wrote: Reason for CRM: Patient's sister called to check on prescription refill status.  She will wait to hear from the the pharmacy.

## 2023-09-18 NOTE — Telephone Encounter (Signed)
 Requested medications are due for refill today.  yes  Requested medications are on the active medications list.  yes  Last refill. 08/23/2021 #60 0 rf  Future visit scheduled.   yes  Notes to clinic.  Rx has not been requested since 08/23/2021.    Requested Prescriptions  Pending Prescriptions Disp Refills   cloNIDine  (CATAPRES ) 0.1 MG tablet 60 tablet 0    Sig: Take 1 tablet (0.1 mg total) by mouth 2 (two) times daily.     Cardiovascular:  Alpha-2 Agonists Passed - 09/18/2023  4:35 PM      Passed - Last BP in normal range    BP Readings from Last 1 Encounters:  07/10/23 132/82         Passed - Last Heart Rate in normal range    Pulse Readings from Last 1 Encounters:  07/10/23 72         Passed - Valid encounter within last 6 months    Recent Outpatient Visits           2 months ago Encounter for Medicare annual wellness exam   [place] [facility] Duanne Butler DASEN, MD   1 year ago Benign essential HTN   [place] [facility] Duanne Butler DASEN, MD   2 years ago Benign essential HTN   [place] [facility] [place], Butler DASEN, MD

## 2023-09-19 ENCOUNTER — Telehealth: Payer: Self-pay

## 2023-09-19 ENCOUNTER — Other Ambulatory Visit: Payer: Self-pay

## 2023-09-19 MED ORDER — CLONIDINE HCL 0.1 MG PO TABS: 0.1000 mg | ORAL_TABLET | Freq: Two times a day (BID) | ORAL | 0 refills | Status: DC

## 2023-09-19 NOTE — Telephone Encounter (Signed)
 Copied from CRM [id number]. Topic: General - Call Back - No Documentation >> Sep 19, 2023  1:14 PM DeAngela L wrote: Reason for CRM: patients sister returned a call for [place] office is away at lunch and she states she will return the call  Devere Browner [phone]

## 2023-09-19 NOTE — Telephone Encounter (Signed)
 Copied from CRM [id number]. Topic: Clinical - Medication Question >> Sep 18, 2023  8:11 AM Essie A wrote: Reason for CRM: Patient's sister called to check on prescription refill status.  She will wait to hear from the the pharmacy. >> Sep 19, 2023  8:48 AM Emylou G wrote: Devere Browner called.. checking status.. she is concerned why the medication hasn't been sent.. I did adv of turn around time?  Pls call her [phone]

## 2023-09-19 NOTE — Telephone Encounter (Signed)
 Lvm for Oscar Greene.

## 2023-09-19 NOTE — Telephone Encounter (Signed)
 Sent in medication

## 2023-10-05 DIAGNOSIS — H348322 Tributary (branch) retinal vein occlusion, left eye, stable: Secondary | ICD-10-CM | POA: Diagnosis not present

## 2023-10-05 DIAGNOSIS — H401131 Primary open-angle glaucoma, bilateral, mild stage: Secondary | ICD-10-CM | POA: Diagnosis not present

## 2023-10-05 DIAGNOSIS — Z961 Presence of intraocular lens: Secondary | ICD-10-CM | POA: Diagnosis not present

## 2023-10-08 ENCOUNTER — Ambulatory Visit: Admitting: Family Medicine

## 2023-10-08 ENCOUNTER — Encounter: Payer: Self-pay | Admitting: Family Medicine

## 2023-10-08 VITALS — BP 138/78 | HR 59 | Temp 97.5°F | Ht 69.0 in | Wt 170.0 lb

## 2023-10-08 DIAGNOSIS — I10 Essential (primary) hypertension: Secondary | ICD-10-CM

## 2023-10-08 NOTE — Progress Notes (Signed)
 Subjective:    Patient ID: Oscar Greene, male    DOB: June 16, 1950, 73 y.o.   MRN: [id number]  Medication Refill  When I last saw the patient, his PSA was greater than 18.  I again recommended urology consultation to evaluate for prostate.  The patient refuses urology consultation.  He is aware of the possible implications but he declines any treatment.  He denies any bone pain.  He is currently smoking.  On his exam today he has wheezing in all 4 lung quadrants.  However he denies any shortness of breath or chest pain.  He does not feel like he needs any inhaler to help his breathing.  His blood pressure is well-controlled at 138/78. Past Medical History:  Diagnosis Date   AKI (acute kidney injury) (HCC) 06/2015   Asymptomatic carotid artery stenosis, right    Cataract    CVA (cerebral vascular accident) (HCC)    Glaucoma    Headache(784.0)    History of traumatic head injury 03/14/1983   A pitching wedge hit him in left frontal area   Hypertension    Osteoporosis    Prostate cancer (HCC)    Dr. Chauncey, Gleason 7   Thrombocytopenia [facility])    Past Surgical History:  Procedure Laterality Date   CATARACT EXTRACTION W/PHACO Bilateral 2013   Dr. Octavia   L frontal craniotomy to remove blood clot     Current Outpatient Medications on File Prior to Visit  Medication Sig Dispense Refill   acetaminophen  (TYLENOL ) 500 MG tablet Take 1 tablet (500 mg total) by mouth every 6 (six) hours as needed. 30 tablet 0   brimonidine (ALPHAGAN) 0.2 % ophthalmic solution 1 drop 2 (two) times daily.     cloNIDine  (CATAPRES ) 0.1 MG tablet Take 1 tablet (0.1 mg total) by mouth 2 (two) times daily. 60 tablet 0   clopidogrel  (PLAVIX ) 75 MG tablet TAKE 1 TABLET BY MOUTH EVERY DAY 90 tablet 3   dorzolamide -timolol  (COSOPT ) 22.3-6.8 MG/ML ophthalmic solution 1 DROP IN BOTH EYES TWICE A DAY  4   latanoprost (XALATAN) 0.005 % ophthalmic solution Place 1 drop into both eyes daily.     naproxen sodium (ALEVE)  220 MG tablet Take 220 mg by mouth daily as needed.     pantoprazole  (PROTONIX ) 40 MG tablet Take by mouth.     predniSONE  (DELTASONE ) 5 MG tablet Take by mouth.     rosuvastatin  (CRESTOR ) 20 MG tablet TAKE 1 TABLET BY MOUTH EVERY DAY 90 tablet 0   traZODone  (DESYREL ) 50 MG tablet Take 1 tablet (50 mg total) by mouth at bedtime as needed for sleep. 90 tablet 1   No current facility-administered medications on file prior to visit.   No Known Allergies  Social History   Socioeconomic History   Marital status: Single    Spouse name: Not on file   Number of children: Not on file   Years of education: Not on file   Highest education level: Not on file  Occupational History   Not on file  Tobacco Use   Smoking status: Every Day    Current packs/day: 0.34    Average packs/day: 0.3 packs/day for 45.0 years (15.3 ttl pk-yrs)    Types: Cigarettes   Smokeless tobacco: Never  Vaping Use   Vaping status: Never Used  Substance and Sexual Activity   Alcohol  use: Yes    Comment: occasional   Drug use: No   Sexual activity: Yes  Other Topics Concern  Not on file  Social History Narrative   Patient states that he lives alone.   States that his sister lives very close to him  (distance he describes is approx distance of one city block) distance from him.   States that he also has a brother who lives very close to him--says that he lives about the same distance from him as the sister does.   He was smoking about one pack a day till he stopped at the time of his hospitalization 11/2013.   Drug screen was positive for cocaine at admission to hospital 11/2013. Patient reports at office visit 11/2013 he does not use cocaine on a routine basis and that he was  at a party 2 weeks ago   Social Drivers of Corporate investment banker Strain: Low Risk  (07/10/2023)   Overall Financial Resource Strain (CARDIA)    Difficulty of Paying Living Expenses: Not hard at all  Food Insecurity: No Food Insecurity  (07/10/2023)   Hunger Vital Sign    Worried About Running Out of Food in the Last Year: Never true    Ran Out of Food in the Last Year: Never true  Transportation Needs: No Transportation Needs (07/10/2023)   PRAPARE - Administrator, Civil Service (Medical): No    Lack of Transportation (Non-Medical): No  Physical Activity: Insufficiently Active (07/10/2023)   Exercise Vital Sign    Days of Exercise per Week: 5 days    Minutes of Exercise per Session: 20 min  Stress: No Stress Concern Present (07/10/2023)   [employer] of Occupational Health - Occupational Stress Questionnaire    Feeling of Stress : Not at all  Social Connections: Socially Isolated (07/10/2023)   Social Connection and Isolation Panel    Frequency of Communication with Friends and Family: More than three times a week    Frequency of Social Gatherings with Friends and Family: More than three times a week    Attends Religious Services: Never    Database administrator or Organizations: No    Attends Banker Meetings: Never    Marital Status: Never married  Intimate Partner Violence: Not At Risk (07/10/2023)   Humiliation, Afraid, Rape, and Kick questionnaire    Fear of Current or Ex-Partner: No    Emotionally Abused: No    Physically Abused: No    Sexually Abused: No       Review of Systems  All other systems reviewed and are negative.      Objective:   Physical Exam Vitals reviewed.  Constitutional:      Appearance: He is well-developed.  Neck:     Vascular: No JVD.  Cardiovascular:     Rate and Rhythm: Normal rate and regular rhythm.     Heart sounds: Normal heart sounds.  Pulmonary:     Effort: Pulmonary effort is normal. No respiratory distress.     Breath sounds: Wheezing present. No rhonchi or rales.  Abdominal:     General: Bowel sounds are normal. There is no distension.     Palpations: Abdomen is soft.     Tenderness: There is no abdominal tenderness. There is no  guarding or rebound.         Assessment & Plan:  Benign essential HTN - Plan: CBC with Differential/Platelet, Comprehensive metabolic panel with GFR, Lipid panel Strongly recommended smoking cessation.  However the patient has no desire to quit at the present time.  Patient declines any further workup for  prostate cancer.  I explained the implications of his elevated PSA however he refuses urology consultation for biopsy.  Check CBC, CMP, lipid panel.  Offered the patient albuterol to use as needed for wheezing but he does not feel like he needs that at the present time.

## 2023-10-09 ENCOUNTER — Ambulatory Visit: Payer: Self-pay | Admitting: Family Medicine

## 2023-10-09 LAB — CBC WITH DIFFERENTIAL/PLATELET
Absolute Lymphocytes: 1601 {cells}/uL (ref 850–3900)
Absolute Monocytes: 567 {cells}/uL (ref 200–950)
Basophils Absolute: 99 {cells}/uL (ref 0–200)
Basophils Relative: 1.8 %
Eosinophils Absolute: 374 {cells}/uL (ref 15–500)
Eosinophils Relative: 6.8 %
HCT: 42.2 % (ref 38.5–50.0)
Hemoglobin: 13.5 g/dL (ref 13.2–17.1)
MCH: 32.1 pg (ref 27.0–33.0)
MCHC: 32 g/dL (ref 32.0–36.0)
MCV: 100.5 fL — ABNORMAL HIGH (ref 80.0–100.0)
MPV: 12.8 fL — ABNORMAL HIGH (ref 7.5–12.5)
Monocytes Relative: 10.3 %
Neutro Abs: 2860 {cells}/uL (ref 1500–7800)
Neutrophils Relative %: 52 %
Platelets: 126 Thousand/uL — ABNORMAL LOW (ref 140–400)
RBC: 4.2 Million/uL (ref 4.20–5.80)
RDW: 11.9 % (ref 11.0–15.0)
Total Lymphocyte: 29.1 %
WBC: 5.5 Thousand/uL (ref 3.8–10.8)

## 2023-10-09 LAB — COMPREHENSIVE METABOLIC PANEL WITH GFR
AG Ratio: 1.7 (calc) (ref 1.0–2.5)
ALT: 13 U/L (ref 9–46)
AST: 20 U/L (ref 10–35)
Albumin: 4 g/dL (ref 3.6–5.1)
Alkaline phosphatase (APISO): 47 U/L (ref 35–144)
BUN: 20 mg/dL (ref 7–25)
CO2: 31 mmol/L (ref 20–32)
Calcium: 9.5 mg/dL (ref 8.6–10.3)
Chloride: 108 mmol/L (ref 98–110)
Creat: 1.08 mg/dL (ref 0.70–1.28)
Globulin: 2.4 g/dL (ref 1.9–3.7)
Glucose, Bld: 54 mg/dL — ABNORMAL LOW (ref 65–99)
Potassium: 4.3 mmol/L (ref 3.5–5.3)
Sodium: 143 mmol/L (ref 135–146)
Total Bilirubin: 0.5 mg/dL (ref 0.2–1.2)
Total Protein: 6.4 g/dL (ref 6.1–8.1)
eGFR: 72 mL/min/1.73m2 (ref >=60)

## 2023-10-09 LAB — LIPID PANEL
Cholesterol: 131 mg/dL (ref <200)
HDL: 47 mg/dL (ref >=40)
LDL Cholesterol (Calc): 64 mg/dL
Non-HDL Cholesterol (Calc): 84 mg/dL (ref <130)
Total CHOL/HDL Ratio: 2.8 (calc) (ref <5.0)
Triglycerides: 121 mg/dL (ref <150)

## 2023-10-09 NOTE — Progress Notes (Signed)
 Left vm for pt to return call
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
## 2023-10-10 NOTE — Progress Notes (Signed)
 Lvm for pt to return call.

## 2023-10-11 ENCOUNTER — Other Ambulatory Visit: Payer: Self-pay | Admitting: Family Medicine

## 2023-10-12 NOTE — Progress Notes (Signed)
 After multiple attempts and vm left. No contact made. Pt. Was sent a result letter w/ providers response. To home address.

## 2023-10-23 ENCOUNTER — Ambulatory Visit (INDEPENDENT_AMBULATORY_CARE_PROVIDER_SITE_OTHER): Admitting: Ophthalmology

## 2023-10-23 ENCOUNTER — Encounter (INDEPENDENT_AMBULATORY_CARE_PROVIDER_SITE_OTHER): Payer: Self-pay | Admitting: Ophthalmology

## 2023-10-23 DIAGNOSIS — Z961 Presence of intraocular lens: Secondary | ICD-10-CM | POA: Diagnosis not present

## 2023-10-23 DIAGNOSIS — H34832 Tributary (branch) retinal vein occlusion, left eye, with macular edema: Secondary | ICD-10-CM | POA: Diagnosis not present

## 2023-10-23 DIAGNOSIS — H35033 Hypertensive retinopathy, bilateral: Secondary | ICD-10-CM

## 2023-10-23 DIAGNOSIS — I10 Essential (primary) hypertension: Secondary | ICD-10-CM | POA: Diagnosis not present

## 2023-10-23 DIAGNOSIS — H401123 Primary open-angle glaucoma, left eye, severe stage: Secondary | ICD-10-CM | POA: Diagnosis not present

## 2023-10-23 MED ORDER — BEVACIZUMAB CHEMO INJECTION 1.25MG/0.05ML SYRINGE FOR KALEIDOSCOPE
1.2500 mg | INTRAVITREAL | Status: AC | PRN
  Administered 2023-10-23 (×2): 1.25 mg via INTRAVITREAL
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
## 2023-11-27 ENCOUNTER — Encounter (INDEPENDENT_AMBULATORY_CARE_PROVIDER_SITE_OTHER): Payer: Self-pay | Admitting: Ophthalmology

## 2023-11-27 ENCOUNTER — Ambulatory Visit (INDEPENDENT_AMBULATORY_CARE_PROVIDER_SITE_OTHER): Admitting: Ophthalmology

## 2023-11-27 DIAGNOSIS — I10 Essential (primary) hypertension: Secondary | ICD-10-CM | POA: Diagnosis not present

## 2023-11-27 DIAGNOSIS — H34832 Tributary (branch) retinal vein occlusion, left eye, with macular edema: Secondary | ICD-10-CM

## 2023-11-27 DIAGNOSIS — H35033 Hypertensive retinopathy, bilateral: Secondary | ICD-10-CM | POA: Diagnosis not present

## 2023-11-27 DIAGNOSIS — Z961 Presence of intraocular lens: Secondary | ICD-10-CM

## 2023-11-27 DIAGNOSIS — H401123 Primary open-angle glaucoma, left eye, severe stage: Secondary | ICD-10-CM

## 2023-11-27 MED ORDER — BEVACIZUMAB CHEMO INJECTION 1.25MG/0.05ML SYRINGE FOR KALEIDOSCOPE
1.2500 mg | INTRAVITREAL | Status: AC | PRN
  Administered 2023-11-27: 1.25 mg via INTRAVITREAL
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
## 2023-12-26 ENCOUNTER — Encounter (INDEPENDENT_AMBULATORY_CARE_PROVIDER_SITE_OTHER): Payer: Self-pay | Admitting: Ophthalmology

## 2023-12-26 ENCOUNTER — Ambulatory Visit (INDEPENDENT_AMBULATORY_CARE_PROVIDER_SITE_OTHER): Admitting: Ophthalmology

## 2023-12-26 DIAGNOSIS — I10 Essential (primary) hypertension: Secondary | ICD-10-CM | POA: Diagnosis not present

## 2023-12-26 DIAGNOSIS — H401123 Primary open-angle glaucoma, left eye, severe stage: Secondary | ICD-10-CM | POA: Diagnosis not present

## 2023-12-26 DIAGNOSIS — Z961 Presence of intraocular lens: Secondary | ICD-10-CM

## 2023-12-26 DIAGNOSIS — H34832 Tributary (branch) retinal vein occlusion, left eye, with macular edema: Secondary | ICD-10-CM | POA: Diagnosis not present

## 2023-12-26 DIAGNOSIS — H35033 Hypertensive retinopathy, bilateral: Secondary | ICD-10-CM

## 2023-12-26 MED ORDER — BEVACIZUMAB CHEMO INJECTION 1.25MG/0.05ML SYRINGE FOR KALEIDOSCOPE
1.2500 mg | INTRAVITREAL | Status: AC | PRN
  Administered 2023-12-26: 1.25 mg via INTRAVITREAL

## 2023-12-29 ENCOUNTER — Other Ambulatory Visit: Payer: Self-pay | Admitting: Family Medicine
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
## 2024-01-23 ENCOUNTER — Ambulatory Visit (INDEPENDENT_AMBULATORY_CARE_PROVIDER_SITE_OTHER): Admitting: Ophthalmology

## 2024-01-23 ENCOUNTER — Encounter (INDEPENDENT_AMBULATORY_CARE_PROVIDER_SITE_OTHER): Payer: Self-pay | Admitting: Ophthalmology

## 2024-01-23 DIAGNOSIS — Z961 Presence of intraocular lens: Secondary | ICD-10-CM | POA: Diagnosis not present

## 2024-01-23 DIAGNOSIS — H34832 Tributary (branch) retinal vein occlusion, left eye, with macular edema: Secondary | ICD-10-CM

## 2024-01-23 DIAGNOSIS — I10 Essential (primary) hypertension: Secondary | ICD-10-CM | POA: Diagnosis not present

## 2024-01-23 DIAGNOSIS — H401123 Primary open-angle glaucoma, left eye, severe stage: Secondary | ICD-10-CM

## 2024-01-23 DIAGNOSIS — H35033 Hypertensive retinopathy, bilateral: Secondary | ICD-10-CM

## 2024-01-23 MED ORDER — BEVACIZUMAB CHEMO INJECTION 1.25MG/0.05ML SYRINGE FOR KALEIDOSCOPE
1.2500 mg | INTRAVITREAL | Status: AC | PRN
  Administered 2024-01-23: 1.25 mg via INTRAVITREAL

## 2024-02-04 ENCOUNTER — Telehealth: Payer: Self-pay | Admitting: Pharmacist

## 2024-02-04 NOTE — Progress Notes (Signed)
 Pharmacy Quality Measure Review  This patient is appearing on the insurance-providing list for being at risk of failing the adherence measure for Statin Therapy for Patients with Cardiovascular Disease [facility]) medications this calendar year.   Patient prescribed rosuvastatin  20 mg daily. Appears this has not been filled since March 2024. Will collaborate with embedded pharmacist to discuss with patient and collaborate with provider for refills.   Catie [facility], PharmD, [facility] Clinical Pharmacist [phone]

## 2024-02-05 ENCOUNTER — Other Ambulatory Visit (HOSPITAL_COMMUNITY): Payer: Self-pay

## 2024-02-05 ENCOUNTER — Other Ambulatory Visit: Payer: Self-pay | Admitting: Family Medicine

## 2024-02-05 MED ORDER — ROSUVASTATIN CALCIUM 20 MG PO TABS
20.0000 mg | ORAL_TABLET | Freq: Every day | ORAL | 3 refills | Status: AC
  Filled 2024-02-05: qty 90, 90d supply, fill #0
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
## 2024-02-15 ENCOUNTER — Other Ambulatory Visit (HOSPITAL_COMMUNITY): Payer: Self-pay

## 2024-02-20 ENCOUNTER — Encounter (INDEPENDENT_AMBULATORY_CARE_PROVIDER_SITE_OTHER): Payer: Self-pay | Admitting: Ophthalmology

## 2024-02-20 ENCOUNTER — Ambulatory Visit (INDEPENDENT_AMBULATORY_CARE_PROVIDER_SITE_OTHER): Admitting: Ophthalmology

## 2024-02-20 DIAGNOSIS — H34832 Tributary (branch) retinal vein occlusion, left eye, with macular edema: Secondary | ICD-10-CM | POA: Diagnosis not present

## 2024-02-20 DIAGNOSIS — H35033 Hypertensive retinopathy, bilateral: Secondary | ICD-10-CM

## 2024-02-20 DIAGNOSIS — H401123 Primary open-angle glaucoma, left eye, severe stage: Secondary | ICD-10-CM

## 2024-02-20 DIAGNOSIS — I10 Essential (primary) hypertension: Secondary | ICD-10-CM | POA: Diagnosis not present

## 2024-02-20 DIAGNOSIS — Z961 Presence of intraocular lens: Secondary | ICD-10-CM

## 2024-02-20 MED ORDER — BEVACIZUMAB CHEMO INJECTION 1.25MG/0.05ML SYRINGE FOR KALEIDOSCOPE
1.2500 mg | INTRAVITREAL | Status: AC | PRN
  Administered 2024-02-20: 1.25 mg via INTRAVITREAL
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
## 2024-03-26 ENCOUNTER — Ambulatory Visit (INDEPENDENT_AMBULATORY_CARE_PROVIDER_SITE_OTHER): Admitting: Ophthalmology

## 2024-03-26 ENCOUNTER — Encounter (INDEPENDENT_AMBULATORY_CARE_PROVIDER_SITE_OTHER): Payer: Self-pay | Admitting: Ophthalmology

## 2024-03-26 DIAGNOSIS — H401123 Primary open-angle glaucoma, left eye, severe stage: Secondary | ICD-10-CM | POA: Diagnosis not present

## 2024-03-26 DIAGNOSIS — H35033 Hypertensive retinopathy, bilateral: Secondary | ICD-10-CM | POA: Diagnosis not present

## 2024-03-26 DIAGNOSIS — I10 Essential (primary) hypertension: Secondary | ICD-10-CM | POA: Diagnosis not present

## 2024-03-26 DIAGNOSIS — H34832 Tributary (branch) retinal vein occlusion, left eye, with macular edema: Secondary | ICD-10-CM

## 2024-03-26 DIAGNOSIS — Z961 Presence of intraocular lens: Secondary | ICD-10-CM

## 2024-03-26 MED ORDER — BEVACIZUMAB CHEMO INJECTION 1.25MG/0.05ML SYRINGE FOR KALEIDOSCOPE
1.2500 mg | INTRAVITREAL | Status: AC | PRN
  Administered 2024-03-26: 1.25 mg via INTRAVITREAL

## 2024-04-08 ENCOUNTER — Other Ambulatory Visit: Payer: Self-pay

## 2024-04-08 ENCOUNTER — Telehealth: Payer: Self-pay

## 2024-04-08 MED ORDER — CLONIDINE HCL 0.1 MG PO TABS: 0.1000 mg | ORAL_TABLET | Freq: Two times a day (BID) | ORAL | 1 refills | Status: AC

## 2024-04-08 NOTE — Telephone Encounter (Signed)
 Sent in medication

## 2024-04-08 NOTE — Telephone Encounter (Signed)
 Prescription Request  04/08/2024  LOV: 10/08/23  What is the name of the medication or equipment? cloNIDine  (CATAPRES ) 0.1 MG tablet [[id number]]   Have you contacted your pharmacy to request a refill? Yes   Which pharmacy would you like this sent to?  [id number] [place], [place] - [name], [place] [address] Phone: [phone]  Fax: [phone] DEA #: [id number]   Patient notified that their request is being sent to the clinical staff for review and that they should receive a response within 2 business days.   Please advise at [facility] [phone]

## 2024-04-11 ENCOUNTER — Encounter: Payer: Self-pay | Admitting: Family Medicine

## 2024-04-11 ENCOUNTER — Ambulatory Visit: Admitting: Family Medicine

## 2024-04-11 VITALS — BP 132/80 | HR 59 | Temp 97.6°F | Ht 69.0 in | Wt 169.4 lb

## 2024-04-11 DIAGNOSIS — Z8673 Personal history of transient ischemic attack (TIA), and cerebral infarction without residual deficits: Secondary | ICD-10-CM

## 2024-04-11 DIAGNOSIS — I6522 Occlusion and stenosis of left carotid artery: Secondary | ICD-10-CM

## 2024-04-11 DIAGNOSIS — I10 Essential (primary) hypertension: Secondary | ICD-10-CM | POA: Diagnosis not present

## 2024-04-11 LAB — COMPREHENSIVE METABOLIC PANEL WITH GFR
AG Ratio: 1.8 (calc) (ref 1.0–2.5)
ALT: 15 U/L (ref 9–46)
AST: 22 U/L (ref 10–35)
Albumin: 4.2 g/dL (ref 3.6–5.1)
Alkaline phosphatase (APISO): 54 U/L (ref 35–144)
BUN/Creatinine Ratio: 22 (calc) (ref 6–22)
BUN: 26 mg/dL — ABNORMAL HIGH (ref 7–25)
CO2: 31 mmol/L (ref 20–32)
Calcium: 9.4 mg/dL (ref 8.6–10.3)
Chloride: 110 mmol/L (ref 98–110)
Creat: 1.19 mg/dL (ref 0.70–1.28)
Globulin: 2.3 g/dL (ref 1.9–3.7)
Glucose, Bld: 84 mg/dL (ref 65–99)
Potassium: 3.8 mmol/L (ref 3.5–5.3)
Sodium: 144 mmol/L (ref 135–146)
Total Bilirubin: 0.7 mg/dL (ref 0.2–1.2)
Total Protein: 6.5 g/dL (ref 6.1–8.1)
eGFR: 64 mL/min/{1.73_m2}

## 2024-04-11 LAB — CBC WITH DIFFERENTIAL/PLATELET
Absolute Lymphocytes: 1490 {cells}/uL (ref 850–3900)
Absolute Monocytes: 566 {cells}/uL (ref 200–950)
Basophils Absolute: 90 {cells}/uL (ref 0–200)
Basophils Relative: 1.6 %
Eosinophils Absolute: 431 {cells}/uL (ref 15–500)
Eosinophils Relative: 7.7 %
HCT: 40.2 % (ref 39.4–51.1)
Hemoglobin: 13 g/dL — ABNORMAL LOW (ref 13.2–17.1)
MCH: 31.9 pg (ref 27.0–33.0)
MCHC: 32.3 g/dL (ref 31.6–35.4)
MCV: 98.5 fL (ref 81.4–101.7)
MPV: 12.8 fL — ABNORMAL HIGH (ref 7.5–12.5)
Monocytes Relative: 10.1 %
Neutro Abs: 3024 {cells}/uL (ref 1500–7800)
Neutrophils Relative %: 54 %
Platelets: 151 10*3/uL (ref 140–400)
RBC: 4.08 Million/uL — ABNORMAL LOW (ref 4.20–5.80)
RDW: 11.8 % (ref 11.0–15.0)
Total Lymphocyte: 26.6 %
WBC: 5.6 10*3/uL (ref 3.8–10.8)

## 2024-04-11 LAB — LIPID PANEL
Cholesterol: 141 mg/dL
HDL: 43 mg/dL
LDL Cholesterol (Calc): 76 mg/dL
Non-HDL Cholesterol (Calc): 98 mg/dL
Total CHOL/HDL Ratio: 3.3 (calc)
Triglycerides: 136 mg/dL

## 2024-04-11 MED ORDER — ALBUTEROL SULFATE HFA 108 (90 BASE) MCG/ACT IN AERS: 2.0000 | INHALATION_SPRAY | Freq: Four times a day (QID) | RESPIRATORY_TRACT | 1 refills | Status: AC | PRN
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
## 2024-04-14 ENCOUNTER — Ambulatory Visit: Payer: Self-pay | Admitting: Family Medicine
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
## 2024-04-30 ENCOUNTER — Encounter (INDEPENDENT_AMBULATORY_CARE_PROVIDER_SITE_OTHER): Admitting: Ophthalmology

## 2024-04-30 DIAGNOSIS — I10 Essential (primary) hypertension: Secondary | ICD-10-CM

## 2024-04-30 DIAGNOSIS — H34832 Tributary (branch) retinal vein occlusion, left eye, with macular edema: Secondary | ICD-10-CM

## 2024-04-30 DIAGNOSIS — H401123 Primary open-angle glaucoma, left eye, severe stage: Secondary | ICD-10-CM

## 2024-04-30 DIAGNOSIS — Z961 Presence of intraocular lens: Secondary | ICD-10-CM

## 2024-04-30 DIAGNOSIS — H35033 Hypertensive retinopathy, bilateral: Secondary | ICD-10-CM

## 2024-06-02 ENCOUNTER — Other Ambulatory Visit
